# Patient Record
Sex: Male | Born: 1935 | Race: White | Hispanic: No | Marital: Married | Smoking: Former smoker
Health system: Southern US, Community
[De-identification: ages and names within clinical notes are randomized; demographics above are authoritative.]

## PROBLEM LIST (undated history)

## (undated) DIAGNOSIS — F32A Depression, unspecified: Secondary | ICD-10-CM

## (undated) DIAGNOSIS — M5136 Other intervertebral disc degeneration, lumbar region: Secondary | ICD-10-CM

## (undated) DIAGNOSIS — I4891 Unspecified atrial fibrillation: Secondary | ICD-10-CM

## (undated) DIAGNOSIS — K4090 Unilateral inguinal hernia, without obstruction or gangrene, not specified as recurrent: Secondary | ICD-10-CM

## (undated) DIAGNOSIS — D649 Anemia, unspecified: Secondary | ICD-10-CM

## (undated) DIAGNOSIS — G20 Parkinson's disease: Secondary | ICD-10-CM

## (undated) DIAGNOSIS — K439 Ventral hernia without obstruction or gangrene: Secondary | ICD-10-CM

## (undated) DIAGNOSIS — G43909 Migraine, unspecified, not intractable, without status migrainosus: Secondary | ICD-10-CM

## (undated) DIAGNOSIS — Q613 Polycystic kidney, unspecified: Secondary | ICD-10-CM

## (undated) DIAGNOSIS — C801 Malignant (primary) neoplasm, unspecified: Secondary | ICD-10-CM

## (undated) DIAGNOSIS — E785 Hyperlipidemia, unspecified: Secondary | ICD-10-CM

## (undated) DIAGNOSIS — I499 Cardiac arrhythmia, unspecified: Secondary | ICD-10-CM

## (undated) DIAGNOSIS — F329 Major depressive disorder, single episode, unspecified: Secondary | ICD-10-CM

## (undated) DIAGNOSIS — G20A1 Parkinson's disease without dyskinesia, without mention of fluctuations: Secondary | ICD-10-CM

## (undated) DIAGNOSIS — K432 Incisional hernia without obstruction or gangrene: Secondary | ICD-10-CM

## (undated) DIAGNOSIS — I359 Nonrheumatic aortic valve disorder, unspecified: Secondary | ICD-10-CM

## (undated) DIAGNOSIS — I10 Essential (primary) hypertension: Secondary | ICD-10-CM

## (undated) DIAGNOSIS — R251 Tremor, unspecified: Secondary | ICD-10-CM

## (undated) HISTORY — DX: Hyperlipidemia, unspecified: E78.5

## (undated) HISTORY — DX: Polycystic kidney, unspecified: Q61.3

## (undated) HISTORY — PX: CARDIAC VALVE REPLACEMENT: SHX585

## (undated) HISTORY — PX: CORONARY ARTERY BYPASS GRAFT: SHX141

## (undated) HISTORY — DX: Parkinson's disease without dyskinesia, without mention of fluctuations: G20.A1

## (undated) HISTORY — DX: Unilateral inguinal hernia, without obstruction or gangrene, not specified as recurrent: K40.90

## (undated) HISTORY — DX: Tremor, unspecified: R25.1

## (undated) HISTORY — DX: Malignant (primary) neoplasm, unspecified: C80.1

## (undated) HISTORY — DX: Anemia, unspecified: D64.9

## (undated) HISTORY — DX: Parkinson's disease: G20

## (undated) HISTORY — DX: Essential (primary) hypertension: I10

## (undated) HISTORY — DX: Nonrheumatic aortic valve disorder, unspecified: I35.9

## (undated) HISTORY — PX: AORTIC AND MITRAL VALVE REPLACEMENT: SHX5056

## (undated) HISTORY — PX: MOHS SURGERY: SUR867

## (undated) HISTORY — PX: APPENDECTOMY: SHX54

## (undated) HISTORY — DX: Unspecified atrial fibrillation: I48.91

## (undated) HISTORY — DX: Other intervertebral disc degeneration, lumbar region: M51.36

## (undated) HISTORY — DX: Migraine, unspecified, not intractable, without status migrainosus: G43.909

---

## 1983-10-18 HISTORY — PX: ASCENDING AORTIC ANEURYSM REPAIR W/ MECHANICAL AORTIC VALVE REPLACEMENT: SHX1192

## 2003-10-05 ENCOUNTER — Other Ambulatory Visit: Payer: Self-pay

## 2005-09-03 ENCOUNTER — Other Ambulatory Visit: Payer: Self-pay

## 2005-09-03 ENCOUNTER — Emergency Department: Payer: Self-pay | Admitting: Emergency Medicine

## 2005-09-22 ENCOUNTER — Ambulatory Visit: Payer: Self-pay | Admitting: *Deleted

## 2005-09-27 ENCOUNTER — Ambulatory Visit: Payer: Self-pay | Admitting: *Deleted

## 2005-09-28 ENCOUNTER — Ambulatory Visit (HOSPITAL_COMMUNITY): Admission: RE | Admit: 2005-09-28 | Discharge: 2005-09-28 | Payer: Self-pay | Admitting: *Deleted

## 2006-07-25 ENCOUNTER — Ambulatory Visit: Payer: Self-pay | Admitting: Unknown Physician Specialty

## 2006-10-30 ENCOUNTER — Emergency Department: Payer: Self-pay | Admitting: Unknown Physician Specialty

## 2008-04-13 DIAGNOSIS — N400 Enlarged prostate without lower urinary tract symptoms: Secondary | ICD-10-CM | POA: Insufficient documentation

## 2008-05-28 ENCOUNTER — Ambulatory Visit: Payer: Self-pay

## 2012-03-21 DIAGNOSIS — I359 Nonrheumatic aortic valve disorder, unspecified: Secondary | ICD-10-CM | POA: Insufficient documentation

## 2012-03-21 DIAGNOSIS — I10 Essential (primary) hypertension: Secondary | ICD-10-CM | POA: Insufficient documentation

## 2013-09-03 DIAGNOSIS — H5000 Unspecified esotropia: Secondary | ICD-10-CM | POA: Insufficient documentation

## 2014-04-10 DIAGNOSIS — Z952 Presence of prosthetic heart valve: Secondary | ICD-10-CM | POA: Insufficient documentation

## 2015-01-14 DIAGNOSIS — E782 Mixed hyperlipidemia: Secondary | ICD-10-CM | POA: Insufficient documentation

## 2015-03-04 ENCOUNTER — Encounter: Payer: Self-pay | Admitting: Physical Therapy

## 2015-03-04 ENCOUNTER — Ambulatory Visit: Payer: Medicare Other | Admitting: Speech Pathology

## 2015-03-04 ENCOUNTER — Ambulatory Visit: Payer: Medicare Other | Attending: Neurology | Admitting: Physical Therapy

## 2015-03-04 DIAGNOSIS — R278 Other lack of coordination: Secondary | ICD-10-CM

## 2015-03-04 DIAGNOSIS — G20 Parkinson's disease: Secondary | ICD-10-CM | POA: Insufficient documentation

## 2015-03-04 DIAGNOSIS — R279 Unspecified lack of coordination: Secondary | ICD-10-CM | POA: Insufficient documentation

## 2015-03-04 DIAGNOSIS — R490 Dysphonia: Secondary | ICD-10-CM

## 2015-03-04 NOTE — Therapy (Signed)
[place] MAIN [facility] SERVICES [address] [place], [place], [address] Phone: [phone]   Fax:  [phone]  Physical Therapy Evaluation  Patient Details  Name: Jonathon Welch MRN: [id number] Date of Birth: 1936-08-27 Referring Provider:  Vladimir Crofts, MD  Encounter Date: 03/04/2015      PT End of Session - 03/04/15 1427    Visit Number 1   Number of Visits 1   Date for PT Re-Evaluation 03/02/15   PT Start Time 0100   PT Stop Time 0200   PT Time Calculation (min) 60 min   Activity Tolerance Patient tolerated treatment well   Behavior During Therapy [facility] for tasks assessed/performed      No past medical history on file.  No past surgical history on file.  There were no vitals filed for this visit.  Visit Diagnosis:  Decreased coordination - Plan: PT plan of care cert/re-cert      Subjective Assessment - 03/04/15 1315    Subjective Patient has difficulty with buttons, wallet in and out pocket, trouble with cell phone,    Currently in Pain? Yes   Pain Location Hip   Pain Orientation Right;Left   Pain Descriptors / Indicators Aching   Pain Onset More than a month ago   Pain Frequency Intermittent            [facility] PT Assessment - 03/04/15 0001    Assessment   Medical Diagnosis parkinsons disease 2007   Onset Date 12/22/14   Next MD Visit no   Prior Therapy no   Precautions   Precautions Other (comment)  no   Restrictions   Weight Bearing Restrictions No   Balance Screen   Has the patient fallen in the past 6 months No   Has the patient had a decrease in activity level because of a fear of falling?  No   Is the patient reluctant to leave their home because of a fear of falling?  No   Home Environment   Living Enviornment Private residence   Living Arrangements Spouse/significant other   Available Help at Discharge Family   Type of [place] to enter   Entrance Stairs-Number of Steps 2    [place] Two level   Functional Tests   Functional tests Sit to Stand  berg balance 56/56   Sit to Stand   Comments 10.92 sec                           PT Education - 03/04/15 1427    Education provided Yes   Person(s) Educated Patient   Methods Explanation;Demonstration;Tactile cues   Comprehension Verbalized understanding;Returned demonstration;Verbal cues required             PT Long Term Goals - 03/04/15 1438    PT LONG TERM GOAL #1   Title Patient will understand the benefits of LSVT BIG program and be independent with HEP.    Status Achieved               Plan - 03/04/15 1428    Clinical Impression Statement Patient is 79 yr old male with dx of parkonsons and the following deficits; poor BUE coordination, decreased dextirity for fine motor control, decreased hand sensation. He has WNL Berg balance and outcome measures that indicate no risk for falls including 5 x sit to stnad and TUG, TUG c Tug cognitive. Patiient has a  wife that is sick and he choses to defer his therapy at this time until he feels that his wifes immediate needs are met. Patient wiould benefit from skilled LSVT BIG to improve peg test and coordination of BUE for ADL's Patietn was educated about the LSVT BIG program benefits and would like to participate in the future.    PT Frequency One time visit   PT Home Exercise Plan Given chop sticks to practice fine motor control, instruted in card shuffling, and card dealing.   Consulted and Agree with Plan of Care Patient         Problem List There are no active problems to display for this patient.   Alanson Puls 03/04/2015, 2:44 PM  [place] MAIN [facility] SERVICES [address] [place], [place], [address] Phone: [phone]   Fax:  [phone]

## 2015-03-05 ENCOUNTER — Encounter: Payer: Self-pay | Admitting: Speech Pathology

## 2015-03-05 DIAGNOSIS — G20 Parkinson's disease: Secondary | ICD-10-CM | POA: Diagnosis not present

## 2015-03-05 NOTE — Therapy (Signed)
[place] MAIN [facility] SERVICES [address] [place], [place], [address] Phone: [phone]   Fax:  [phone]  Speech Language Pathology Evaluation  Patient Details  Name: Jonathon Welch MRN: [id number] Date of Birth: [birth year] Referring Provider:  Vladimir Crofts, MD  Encounter Date: 03/04/2015      End of Session - 03/04/15 1415    Visit Number 1   Number of Visits 1   Date for SLP Re-Evaluation 03/04/15   SLP Start Time 1400   SLP Stop Time  2671   SLP Time Calculation (min) 49 min   Activity Tolerance Patient tolerated treatment well      History reviewed. No pertinent past medical history.  History reviewed. No pertinent past surgical history.  There were no vitals filed for this visit.  Visit Diagnosis: Dysphonia - Plan: SLP plan of care cert/re-cert      Subjective Assessment - 03/04/15 1407    Subjective Patient is not sure that he is having trouble being understood by others           SLP Evaluation [facility] - 03/04/15 1408    SLP Visit Information   SLP Received On 03/04/15   Onset Date --  Diagnosed with Parkinson's Disease in 2007   Medical Diagnosis Parkinson's Disease   Prior Functional Status   Cognitive/Linguistic Baseline Baseline deficits   Baseline deficit details Dysphonia secondary Parkinson's disease   Cognition   Overall Cognitive Status Within Functional Limits for tasks assessed   Auditory Comprehension   Overall Auditory Comprehension Appears within functional limits for tasks assessed   Reading Comprehension   Reading Status Within funtional limits   Verbal Expression   Overall Verbal Expression Appears within functional limits for tasks assessed   Oral Motor/Sensory Function   Overall Oral Motor/Sensory Function Appears within functional limits for tasks assessed   Motor Speech   Overall Motor Speech Impaired   Respiration Impaired   Level of Impairment Conversation   Phonation Low  vocal intensity;Hoarse   Resonance Within functional limits   Articulation Within functional limitis   Intelligibility Intelligible   Motor Planning [place] functional limits   Motor Speech Errors Not applicable   Phonation Impaired   Standardized Assessments   Standardized Assessments  --  Perceptual Voice Evaluation      Perceptual Voice Evaluation Maximum phonation time for sustained ah:  12 seconds  Mean intensity during sustained ah: 66 dB  Mean intensity during sustained during conversational speech: 64 dB  Average fundamental frequency during sustained ah: 159 Hz  Highest dynamic pitch when altering pitch from a low note to a high note:  308 Hz  Highest pitch during conversational speech: 581 Hz  Lowest dynamic pitch when altering from a high note to a low note  128 Hz  Lowest pitch during conversational speech:  104 Hz  Visi-Pitch: Multi-Dimensional Voice Program (MDVP)  MDVP extracts objective quantitative values  (Relative Average Perturbation, Shimmer, Voice Turbulence Index, and Noise to Harmonic Ratio) on sustained  phonation, which are displayed graphically and numerically in comparison to a built-in normative database.   The patient exhibited values within normal limits for Relative Average Perturbation, Shimmer, Voice Turbulence  Index, and Noise to Harmonic Ratio.           SLP Education - 03/05/15 1414    Education provided Yes   Education Details Explain LSVT-LOUD program   Person(s) Educated Patient   Methods Explanation;Demonstration   Comprehension Verbalized understanding;Returned demonstration  Plan - 04-04-15 1416    Clinical Impression Statement This 79 year old man, with Parkinson' disease, is presenting with moderate dysphonia characterized by hypophonia, monotone voice, and hoarse vocal quality.  Based on stimulability testing, the patient is judged to be a good candidate for the LSVT LOUD program.  It is  recommended that the patient receive the LSVT LOUD program which is comprised of 16 intensive sessions (4 times per week for 4 weeks, one hour sessions).  LSVT LOUD has been documented in the literature as efficacious for individuals with Parkinson's disease.  The patient has opted not to start LSVT LOUD at this time as he cannot commit to the intensive schedule at this time.  He states that he will discuss the LSVT programs when he next see his MD.   Speech Therapy Frequency 1x /week   Duration 1 week   Treatment/Interventions Other (comment)  LSVT LOUD assessment protocol, education RE: LSVT LOUD   Potential to Achieve Goals Good   Potential Considerations Ability to learn/carryover information;Cooperation/participation level;Previous level of function;Severity of impairments;Family/community support   Consulted and Agree with Plan of Care Patient          G-Codes - April 04, 2015 1421    Functional Assessment Tool Used LSVT LOUD assessment protocol   Functional Limitations Voice   Voice Current Status (G9171) At least 40 percent but less than 60 percent impaired, limited or restricted   Voice Goal Status (M3361) At least 40 percent but less than 60 percent impaired, limited or restricted   Voice Discharge Status (Q2449) At least 40 percent but less than 60 percent impaired, limited or restricted      Problem List There are no active problems to display for this patient.  [name], MS/CCC- SLP  Lou Miner 2015/04/04, 2:28 PM  [place] MAIN [facility] SERVICES [address] [place], [place], [address] Phone: [phone]   Fax:  [phone]

## 2015-03-09 ENCOUNTER — Ambulatory Visit: Payer: Medicare Other | Admitting: Physical Therapy

## 2015-03-09 ENCOUNTER — Ambulatory Visit: Payer: Medicare Other | Admitting: Speech Pathology

## 2015-03-10 ENCOUNTER — Ambulatory Visit: Payer: Medicare Other | Admitting: Physical Therapy

## 2015-03-10 ENCOUNTER — Ambulatory Visit: Payer: Medicare Other | Admitting: Speech Pathology

## 2015-03-11 ENCOUNTER — Ambulatory Visit: Payer: Self-pay | Admitting: Speech Pathology

## 2015-03-11 ENCOUNTER — Ambulatory Visit: Payer: Medicare Other | Admitting: Physical Therapy

## 2015-03-12 ENCOUNTER — Ambulatory Visit: Payer: Medicare Other | Admitting: Physical Therapy

## 2015-03-12 ENCOUNTER — Ambulatory Visit: Payer: Medicare Other | Admitting: Speech Pathology

## 2015-03-17 ENCOUNTER — Ambulatory Visit: Payer: Medicare Other | Admitting: Physical Therapy

## 2015-03-17 ENCOUNTER — Ambulatory Visit: Payer: Medicare Other | Admitting: Speech Pathology

## 2015-03-18 ENCOUNTER — Ambulatory Visit: Payer: Medicare Other | Admitting: Physical Therapy

## 2015-03-18 ENCOUNTER — Encounter: Payer: Self-pay | Admitting: Speech Pathology

## 2015-03-19 ENCOUNTER — Encounter: Payer: Self-pay | Admitting: Speech Pathology

## 2015-03-19 ENCOUNTER — Ambulatory Visit: Payer: Medicare Other | Admitting: Physical Therapy

## 2015-03-20 ENCOUNTER — Encounter: Payer: Self-pay | Admitting: Speech Pathology

## 2015-03-23 ENCOUNTER — Encounter: Payer: Self-pay | Admitting: Speech Pathology

## 2015-03-23 ENCOUNTER — Ambulatory Visit: Payer: Medicare Other | Admitting: Physical Therapy

## 2015-03-24 ENCOUNTER — Encounter: Payer: Self-pay | Admitting: Speech Pathology

## 2015-03-24 ENCOUNTER — Ambulatory Visit: Payer: Medicare Other | Admitting: Physical Therapy

## 2015-03-25 ENCOUNTER — Ambulatory Visit: Payer: Medicare Other | Admitting: Physical Therapy

## 2015-03-25 ENCOUNTER — Encounter: Payer: Self-pay | Admitting: Speech Pathology

## 2015-03-26 ENCOUNTER — Ambulatory Visit: Payer: Medicare Other | Admitting: Physical Therapy

## 2015-03-26 ENCOUNTER — Encounter: Payer: Self-pay | Admitting: Speech Pathology

## 2015-03-30 ENCOUNTER — Encounter: Payer: Self-pay | Admitting: Speech Pathology

## 2015-03-30 ENCOUNTER — Ambulatory Visit: Payer: Medicare Other | Admitting: Physical Therapy

## 2015-03-31 ENCOUNTER — Ambulatory Visit: Payer: Self-pay | Admitting: Physical Therapy

## 2015-03-31 ENCOUNTER — Encounter: Payer: Self-pay | Admitting: Speech Pathology

## 2015-04-01 ENCOUNTER — Encounter: Payer: Self-pay | Admitting: Speech Pathology

## 2015-04-02 ENCOUNTER — Ambulatory Visit: Payer: Self-pay | Admitting: Physical Therapy

## 2015-04-03 ENCOUNTER — Encounter: Payer: Self-pay | Admitting: Speech Pathology

## 2015-04-03 ENCOUNTER — Ambulatory Visit: Payer: Self-pay | Admitting: Physical Therapy

## 2015-04-06 ENCOUNTER — Ambulatory Visit: Payer: Self-pay | Admitting: Physical Therapy

## 2015-04-07 ENCOUNTER — Ambulatory Visit: Payer: Self-pay | Admitting: Physical Therapy

## 2016-01-14 DIAGNOSIS — I482 Chronic atrial fibrillation, unspecified: Secondary | ICD-10-CM | POA: Insufficient documentation

## 2016-02-29 ENCOUNTER — Other Ambulatory Visit: Payer: Self-pay | Admitting: Internal Medicine

## 2016-02-29 DIAGNOSIS — M5116 Intervertebral disc disorders with radiculopathy, lumbar region: Secondary | ICD-10-CM

## 2016-03-04 ENCOUNTER — Ambulatory Visit
Admission: RE | Admit: 2016-03-04 | Discharge: 2016-03-04 | Disposition: A | Discharge status: Home / Self Care | Payer: Medicare Other | Source: Ambulatory Visit | Attending: Internal Medicine | Admitting: Internal Medicine

## 2016-03-04 DIAGNOSIS — M4806 Spinal stenosis, lumbar region: Secondary | ICD-10-CM | POA: Diagnosis not present

## 2016-03-04 DIAGNOSIS — M5116 Intervertebral disc disorders with radiculopathy, lumbar region: Secondary | ICD-10-CM

## 2016-03-04 DIAGNOSIS — M5136 Other intervertebral disc degeneration, lumbar region: Secondary | ICD-10-CM | POA: Insufficient documentation

## 2016-07-25 DIAGNOSIS — M5136 Other intervertebral disc degeneration, lumbar region: Secondary | ICD-10-CM

## 2016-07-25 DIAGNOSIS — M51369 Other intervertebral disc degeneration, lumbar region without mention of lumbar back pain or lower extremity pain: Secondary | ICD-10-CM

## 2016-07-25 DIAGNOSIS — E538 Deficiency of other specified B group vitamins: Secondary | ICD-10-CM | POA: Insufficient documentation

## 2016-07-25 HISTORY — DX: Other intervertebral disc degeneration, lumbar region without mention of lumbar back pain or lower extremity pain: M51.369

## 2016-07-25 HISTORY — DX: Other intervertebral disc degeneration, lumbar region: M51.36

## 2017-01-30 ENCOUNTER — Other Ambulatory Visit: Payer: Self-pay

## 2017-02-01 ENCOUNTER — Ambulatory Visit (INDEPENDENT_AMBULATORY_CARE_PROVIDER_SITE_OTHER): Payer: Medicare Other | Admitting: Surgery

## 2017-02-01 ENCOUNTER — Other Ambulatory Visit: Payer: Self-pay

## 2017-02-01 ENCOUNTER — Encounter: Payer: Self-pay | Admitting: Surgery

## 2017-02-01 VITALS — BP 163/76 | HR 56 | Temp 97.5°F | Wt 172.0 lb

## 2017-02-01 DIAGNOSIS — K4020 Bilateral inguinal hernia, without obstruction or gangrene, not specified as recurrent: Secondary | ICD-10-CM | POA: Diagnosis not present

## 2017-02-01 DIAGNOSIS — K439 Ventral hernia without obstruction or gangrene: Secondary | ICD-10-CM | POA: Diagnosis not present

## 2017-02-01 NOTE — Progress Notes (Signed)
Surgical Consultation  02/01/2017  Jonathon Welch is an 81 y.o. male.   CC:IH  HPI: This patient with multiple medical problems with an inguinal hernia referred over by [facility] clinic. It is on the right side. He is anticoagulated for an aortic valve. He has no pain no nausea vomiting he's noticed his belly is getting bigger over the last year. He is known about the hernia for over a year. He also questions whether he's had other hernias. He had a prior umbilical hernia repair at [facility] many years ago. Past Medical History:  Diagnosis Date  . Anemia   . Aortic valve disease   . Atrial fibrillation ([place])   . Cancer ([place])    basal and squamous cell skin cancer  . DDD (degenerative disc disease), lumbar 07/25/2016  . Hyperlipidemia   . Hypertension   . Migraine headache   . Parkinson disease ([place])   . Polycystic kidney disease   . Right inguinal hernia   . Tremor     Past Surgical History:  Procedure Laterality Date  . APPENDECTOMY    . ASCENDING AORTIC ANEURYSM REPAIR W/ MECHANICAL AORTIC VALVE REPLACEMENT    . CARDIAC VALVE REPLACEMENT  1980 and 1985  . MOHS SURGERY      Family History  Problem Relation Age of Onset  . Heart disease Mother   . Hypertension Mother   . Stroke Father   . Kidney disease Sister     Social History:  reports that he quit smoking about 47 years ago. He has a 22.00 pack-year smoking history. He has never used smokeless tobacco. He reports that he does not drink alcohol or use drugs.  Allergies:  Allergies  Allergen Reactions  . Aspirin Nausea And Vomiting  . 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   Review of Systems:   Review of Systems  Constitutional: Negative.   HENT: Negative.   Eyes: Negative.   Respiratory: Negative.   Cardiovascular: Negative.   Gastrointestinal: Negative.   Genitourinary: Negative.    Musculoskeletal: Negative.   Skin: Negative.   Neurological: Negative.   Endo/Heme/Allergies: Negative.   Psychiatric/Behavioral: Negative.      Physical Exam:  BP (!) 163/76   Pulse (!) 56   Temp 97.5 F (36.4 C) (Oral)   Wt 172 lb (78 kg)   Physical Exam  Constitutional: He is oriented to person, place, and time and well-developed, well-nourished, and in no distress.  Weak frail appearing male. He has Parkinson's tremor and has difficulty on but now his pants. He walks and a shuffling gait.  HENT:  Head: Normocephalic and atraumatic.  Eyes: Pupils are equal, round, and reactive to light. Right eye exhibits no discharge. Left eye exhibits no discharge. No scleral icterus.  Neck: Normal range of motion.  Pulmonary/Chest: Effort normal. No respiratory distress.  Abdominal: Soft. He exhibits distension. There is no tenderness. There is no rebound and no guarding.  Multiple scars present. There is an epigastric hernia beneath the sternotomy wound that extends onto his abdomen There is a scar on the midline with a surgically absent umbilicus with a recurrent ventral hernia this is non-tender and completely reducible Exam in supine and standing he has bilateral inguinal hernias right greater than left with normal testicles  Genitourinary: Penis normal. No discharge found.  Genitourinary Comments: Right greater than  left inguinal hernias both reducible and nontender  Musculoskeletal: Normal range of motion. He exhibits edema. He exhibits no tenderness.  Lymphadenopathy:    He has no cervical adenopathy.  Neurological: He is alert and oriented to person, place, and time.  Parkinson's tremor  Skin: Skin is warm and dry. No erythema.  Psychiatric: Mood and affect normal.  Vitals reviewed.     No results found for this or any previous visit (from the past 48 hour(s)). No results found.  Assessment/Plan:  Patient comes to visit for a right inguinal hernia with each is  essentially asymptomatic. On exam and by history he has a epigastric hernia from prior sternotomy. He also has a surgically absent umbilicus with a recurrent ventral hernia and on exam he has both right and left inguinal hernias.  I discussed with them the procedure of repairing one or all of the 4 hernias identified. Currently he is completely asymptomatic and carries with him significant risk especially for bleeding and subsequent infection if mesh were to be used which would almost certainly be a necessity. Recommendations are to observe these had to utilize a truss for the bilateral inguinal hernias and follow-up on an as-needed basis  Florene Glen, MD, FACS

## 2017-02-01 NOTE — Patient Instructions (Signed)
You may purchase a Bilateral Hernia Truss from your local drug store or [employer]. This will help with keeping the hernia in place. Please call our office if you have any questions or concerns.

## 2017-05-02 ENCOUNTER — Encounter: Payer: Self-pay | Admitting: Emergency Medicine

## 2017-05-02 ENCOUNTER — Emergency Department: Payer: Medicare Other

## 2017-05-02 ENCOUNTER — Emergency Department
Admission: EM | Admit: 2017-05-02 | Discharge: 2017-05-02 | Disposition: A | Discharge status: Home / Self Care | Payer: Medicare Other | Attending: Emergency Medicine | Admitting: Emergency Medicine

## 2017-05-02 DIAGNOSIS — R42 Dizziness and giddiness: Secondary | ICD-10-CM | POA: Diagnosis not present

## 2017-05-02 DIAGNOSIS — Z91010 Allergy to peanuts: Secondary | ICD-10-CM | POA: Insufficient documentation

## 2017-05-02 DIAGNOSIS — I10 Essential (primary) hypertension: Secondary | ICD-10-CM | POA: Insufficient documentation

## 2017-05-02 DIAGNOSIS — R079 Chest pain, unspecified: Secondary | ICD-10-CM | POA: Diagnosis present

## 2017-05-02 DIAGNOSIS — G20 Parkinson's disease: Secondary | ICD-10-CM | POA: Insufficient documentation

## 2017-05-02 DIAGNOSIS — Z87891 Personal history of nicotine dependence: Secondary | ICD-10-CM | POA: Diagnosis not present

## 2017-05-02 LAB — URINALYSIS, COMPLETE (UACMP) WITH MICROSCOPIC
Bacteria, UA: NONE SEEN
Bilirubin Urine: NEGATIVE
Glucose, UA: NEGATIVE mg/dL
Hgb urine dipstick: NEGATIVE
KETONES UR: 5 mg/dL — AB
Leukocytes, UA: NEGATIVE
Nitrite: NEGATIVE
PROTEIN: NEGATIVE mg/dL
RBC / HPF: NONE SEEN RBC/hpf (ref 0–5)
SQUAMOUS EPITHELIAL / LPF: NONE SEEN
Specific Gravity, Urine: 1.013 (ref 1.005–1.030)
WBC UA: NONE SEEN WBC/hpf (ref 0–5)
pH: 6 (ref 5.0–8.0)

## 2017-05-02 LAB — CBC
HCT: 42.7 % (ref 40.0–52.0)
HEMOGLOBIN: 13.9 g/dL (ref 13.0–18.0)
MCH: 26.9 pg (ref 26.0–34.0)
MCHC: 32.6 g/dL (ref 32.0–36.0)
MCV: 82.5 fL (ref 80.0–100.0)
PLATELETS: 144 10*3/uL — AB (ref 150–440)
RBC: 5.18 MIL/uL (ref 4.40–5.90)
RDW: 15.6 % — ABNORMAL HIGH (ref 11.5–14.5)
WBC: 4.1 10*3/uL (ref 3.8–10.6)

## 2017-05-02 LAB — BASIC METABOLIC PANEL
Anion gap: 9 (ref 5–15)
BUN: 29 mg/dL — ABNORMAL HIGH (ref 6–20)
CHLORIDE: 103 mmol/L (ref 101–111)
CO2: 26 mmol/L (ref 22–32)
CREATININE: 1.57 mg/dL — AB (ref 0.61–1.24)
Calcium: 9 mg/dL (ref 8.9–10.3)
GFR calc non Af Amer: 40 mL/min — ABNORMAL LOW (ref >60)
GFR, EST AFRICAN AMERICAN: 47 mL/min — AB (ref >60)
GLUCOSE: 99 mg/dL (ref 65–99)
Potassium: 3.9 mmol/L (ref 3.5–5.1)
Sodium: 138 mmol/L (ref 135–145)

## 2017-05-02 LAB — PROTIME-INR
INR: 2.8
PROTHROMBIN TIME: 30.1 s — AB (ref 11.4–15.2)

## 2017-05-02 LAB — TROPONIN I: Troponin I: <0.03 ng/mL (ref <0.03)

## 2017-05-02 NOTE — ED Provider Notes (Signed)
[facility] Emergency Department Provider Note       Time seen: ----------------------------------------- 6:21 PM on 05/02/2017 -----------------------------------------     I have reviewed the triage vital signs and the nursing notes.   HISTORY   Chief Complaint Chest Pain and Dizziness    HPI Jonathon Welch is a 81 y.o. male who presents to the ED for chest pressure and dizziness. Patient had an event where he had taken a nap for around 3 hours, subsequently woke up and symptoms started when he stood up. Patient states he took 5 or 6 steps and began feeling like he did not have control over his body. Patient felt lightheaded and dizzy but does not describe room spinning sensation. His wife states he is more lethargic than normal but he denies any complaints currently. He did have an episode of chest pressure and this seemed to start after an episode of vomiting.   Past Medical History:  Diagnosis Date  . Anemia   . Aortic valve disease   . Atrial fibrillation ([place])   . Cancer ([place])    basal and squamous cell skin cancer  . DDD (degenerative disc disease), lumbar 07/25/2016  . Hyperlipidemia   . Hypertension   . Migraine headache   . Parkinson disease ([place])   . Polycystic kidney disease   . Right inguinal hernia   . Tremor     Patient Active Problem List   Diagnosis Date Noted  . B12 deficiency 07/25/2016  . DDD (degenerative disc disease), lumbar 07/25/2016  . Chronic atrial fibrillation ([place]) 01/14/2016  . Combined fat and carbohydrate induced hyperlipemia 01/14/2015  . H/O aortic valve replacement 04/10/2014  . 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 Aortic valve defect 03/21/2012  . Hypertension 03/21/2012  . Benign essential HTN 04/14/2008  . Benign prostatic hypertrophy without urinary obstruction 04/13/2008  . Parkinson's disease ([place]) 04/13/2008    Past Surgical History:  Procedure Laterality Date  . APPENDECTOMY    . ASCENDING  AORTIC ANEURYSM REPAIR W/ MECHANICAL AORTIC VALVE REPLACEMENT    . CARDIAC VALVE REPLACEMENT  1980 and 1985  . MOHS SURGERY      Allergies Aspirin and Peanut oil  Social History Social History  Substance Use Topics  . Smoking status: Former Smoker    Packs/day: 2.00    Years: 11.00    Quit date: 5  . Smokeless tobacco: Never Used  . Alcohol use No    Review of Systems Constitutional: Negative for fever. Eyes: Negative for vision changes ENT:  Negative for congestion, sore throat Cardiovascular: Positive for chest pressure Respiratory: Negative for shortness of breath. Gastrointestinal: Negative for abdominal pain, positive for vomiting Genitourinary: Negative for dysuria. Musculoskeletal: Negative for back pain. Skin: Negative for rash. Neurological: Negative for headaches, focal weakness or numbness.  All systems negative/normal/unremarkable except as stated in the HPI  ____________________________________________   PHYSICAL EXAM:  VITAL SIGNS: ED Triage Vitals [05/02/17 1637]  Enc Vitals Group     BP (!) 164/89     Pulse Rate (!) 55     Resp 20     Temp (!) 97.5 F (36.4 C)     Temp Source Oral     SpO2 98 %     Weight 172 lb (78 kg)     Height      Head Circumference      Peak Flow      Pain Score      Pain Loc      Pain  Edu?      Excl. in [place]?    Constitutional: Alert and oriented. Well appearing and in no distress. Eyes: Conjunctivae are normal. Normal extraocular movements. ENT   Head: Normocephalic and atraumatic.   Nose: No congestion/rhinnorhea.   Mouth/Throat: Mucous membranes are moist.   Neck: No stridor. Cardiovascular: Normal rate, regular rhythm. Aortic Valvular click is auscultated Respiratory: Normal respiratory effort without tachypnea nor retractions. Breath sounds are clear and equal bilaterally. No wheezes/rales/rhonchi. Gastrointestinal: Soft and nontender. Normal bowel sounds Musculoskeletal: Nontender with normal  range of motion in extremities. No lower extremity tenderness nor edema. Neurologic:  Normal speech and language. No gross focal neurologic deficits are appreciated. Normal strength Skin:  Skin is warm, dry and intact. No rash noted. Psychiatric: Mood and affect are normal. Speech and behavior are normal.  ____________________________________________  EKG: Interpreted by me. Sinus bradycardia with sinus arrhythmia, occasional PVCs, LVH with repolarization abnormality  ____________________________________________  ED COURSE:  Pertinent labs & imaging results that were available during my care of the patient were reviewed by me and considered in my medical decision making (see chart for details). Patient presents for dizziness and vomiting with chest tightness, we will assess with labs and imaging as indicated. Clinical Course as of May 03 1955  Tue May 02, 2017  1919 Patient was able to ambulate without any difficulty here.  [JW]  [place] and patient would prefer that he go home today  [JW]    Clinical Course User Index [JW] Earleen Newport, MD   Procedures ____________________________________________   LABS (pertinent positives/negatives)  Labs Reviewed  BASIC METABOLIC PANEL - Abnormal; Notable for the following:       Result Value   BUN 29 (*)    Creatinine, Ser 1.57 (*)    GFR calc non Af Amer 40 (*)    GFR calc Af Amer 47 (*)    All other components within normal limits  CBC - Abnormal; Notable for the following:    RDW 15.6 (*)    Platelets 144 (*)    All other components within normal limits  URINALYSIS, COMPLETE (UACMP) WITH MICROSCOPIC - Abnormal; Notable for the following:    Color, Urine YELLOW (*)    APPearance CLEAR (*)    Ketones, ur 5 (*)    All other components within normal limits  PROTIME-INR - Abnormal; Notable for the following:    Prothrombin Time 30.1 (*)    All other components within normal limits  TROPONIN I  CBG MONITORING, ED     RADIOLOGY CT head  IMPRESSION: No definite acute finding by CT. Low-density in the pons that could relate to chronic small vessel disease, but more recent pontine insult cannot be excluded by CT.  Dense cerebellar calcification in a pattern that can indicate metabolic/endocrine abnormalities such as hyperparathyroidism.  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 ____________________________________________  FINAL ASSESSMENT AND PLAN  Chest pain, dizziness, vomiting  Plan: Patient's labs and imaging were dictated above. Patient had presented for dizziness with an isolated episode of chest pain and vomiting. This test here been reassuring although I would prefer he be admitted for observation the family states they would rather he go home. I will advise close outpatient follow-up.   Earleen Newport, MD   Note: This note was generated in part or whole with voice recognition software. Voice recognition is usually quite accurate but there are transcription errors that can and very often do occur. I apologize for any typographical  errors that were not detected and corrected.     Earleen Newport, MD 05/02/17 [phone]

## 2017-05-02 NOTE — ED Triage Notes (Signed)
Pt with chest pressure and dizziness.

## 2017-05-02 NOTE — ED Notes (Signed)
Ambulated pt around the room. Pt appears stable and has good balance. No issues walking.

## 2017-05-02 NOTE — ED Notes (Signed)
Patient discharged to home per MD order. Patient in stable condition, and deemed medically cleared by ED provider for discharge. Discharge instructions reviewed with patient/family using "Teach Back"; verbalized understanding of medication education and administration, and information about follow-up care. Denies further concerns. ° °

## 2017-10-17 DIAGNOSIS — K439 Ventral hernia without obstruction or gangrene: Secondary | ICD-10-CM

## 2017-10-17 DIAGNOSIS — K4090 Unilateral inguinal hernia, without obstruction or gangrene, not specified as recurrent: Secondary | ICD-10-CM

## 2017-10-17 HISTORY — DX: Unilateral inguinal hernia, without obstruction or gangrene, not specified as recurrent: K40.90

## 2017-10-17 HISTORY — DX: Ventral hernia without obstruction or gangrene: K43.9

## 2018-03-21 ENCOUNTER — Ambulatory Visit: Payer: Self-pay | Admitting: Surgery

## 2018-03-21 ENCOUNTER — Ambulatory Visit (INDEPENDENT_AMBULATORY_CARE_PROVIDER_SITE_OTHER): Payer: Medicare Other | Admitting: Surgery

## 2018-03-21 ENCOUNTER — Encounter: Payer: Self-pay | Admitting: Surgery

## 2018-03-21 VITALS — BP 163/84 | HR 70 | Temp 97.5°F | Ht >= 80 in | Wt 160.8 lb

## 2018-03-21 DIAGNOSIS — K4020 Bilateral inguinal hernia, without obstruction or gangrene, not specified as recurrent: Secondary | ICD-10-CM

## 2018-03-21 NOTE — Patient Instructions (Addendum)
You have requested to have a Ventral Hernia Repair. This will be done on 04/06/18 by Dr.Diego Pabon  at [facility]. Please see your (BLUE) Pre-care sheet for more information.  You will need to arrange to be out of work for approximately 1-2 weeks and then you may return with a lifting restriction for 4 more weeks. If you have FMLA or Disability paperwork that needs to be filled out, please have your company fax your paperwork to [phone] or you may drop this by either office. This paperwork will be filled out within 3 days after your surgery has been completed.  Ventral Hernia A ventral hernia (also called an incisional hernia) is a hernia that occurs at the site of a previous surgical cut (incision) in the abdomen. The abdominal wall spans from your lower chest down to your pelvis. If the abdominal wall is weakened from a surgical incision, a hernia can occur. A hernia is a bulge of bowel or muscle tissue pushing out on the weakened part of the abdominal wall. Ventral hernias can get bigger from straining or lifting. Obese and older people are at higher risk for a ventral hernia. People who develop infections after surgery or require repeat incisions at the same site on the abdomen are also at increased risk. CAUSES  A ventral hernia occurs because of weakness in the abdominal wall at an incision site.  SYMPTOMS  Common symptoms include:  A visible bulge or lump on the abdominal wall.  Pain or tenderness around the lump.  Increased discomfort if you cough or make a sudden movement. If the hernia has blocked part of the intestine, a serious complication can occur (incarcerated or strangulated hernia). This can become a problem that requires emergency surgery because the blood flow to the blocked intestine may be cut off. Symptoms may include:  Feeling sick to your stomach (nauseous).  Throwing up (vomiting).  Stomach swelling (distention) or bloating.  Fever.  Rapid heartbeat. DIAGNOSIS    Your health care provider will take a medical history and perform a physical exam. Various tests may be ordered, such as:  Blood tests.  Urine tests.  Ultrasonography.  X-rays.  Computed tomography (CT). TREATMENT  Watchful waiting may be all that is needed for a smaller hernia that does not cause symptoms. Your health care provider may recommend the use of a supportive belt (truss) that helps to keep the abdominal wall intact. For larger hernias or those that cause pain, surgery to repair the hernia is usually recommended. If a hernia becomes strangulated, emergency surgery needs to be done right away. HOME CARE INSTRUCTIONS  Avoid putting pressure or strain on the abdominal area.  Avoid heavy lifting.  Use good body positioning for physical tasks. Ask your health care provider about proper body positioning.  Use a supportive belt as directed by your health care provider.  Maintain a healthy weight.  Eat foods that are high in fiber, such as whole grains, fruits, and vegetables. Fiber helps prevent difficult bowel movements (constipation).  Drink enough fluids to keep your urine clear or pale yellow.  Follow up with your health care provider as directed. SEEK MEDICAL CARE IF:   Your hernia seems to be getting larger or more painful. SEEK IMMEDIATE MEDICAL CARE IF:   You have abdominal pain that is sudden and sharp.  Your pain becomes severe.  You have repeated vomiting.  You are sweating a lot.  You notice a rapid heartbeat.  You develop a fever. MAKE SURE YOU:  Understand these instructions.  Will watch your condition.  Will get help right away if you are not doing well or get worse.   This information is not intended to replace advice given to you by your health care provider. Make sure you discuss any questions you have with your health care provider.   Document Released: 09/19/2012 Document Revised: 10/24/2014 Document Reviewed: 09/19/2012 Elsevier  Interactive Patient Education 2016 Elsevier Inc.    Laparoscopic Ventral Hernia Repair Laparoscopic ventral hernia repairis a surgery to fix a ventral hernia. Aventral hernia, also called an incisional hernia, is a bulge of body tissue or intestines that pushes through the front part of the abdomen. This can happen if the connective tissue covering the muscles over the abdomen has a weak spot or is torn because of a surgical cut (incision) from a previous surgery. Laparoscopic ventral hernia repair is often done soon after diagnosis to stop the hernia from getting bigger, becoming uncomfortable, or becoming an emergency. This surgery usually takes about 2 hours, but the time can vary greatly. LET [facility] CARE PROVIDER KNOW ABOUT:  Any allergies you have.  All medicines you are taking, including steroids, vitamins, herbs, eye drops, creams, and over-the-counter medicines.  Previous problems you or members of your family have had with the use of anesthetics.  Any blood disorders you have.  Previous surgeries you have had.  Medical conditions you have. RISKS AND COMPLICATIONS  Generally, laparoscopic ventral hernia repair is a safe procedure. However, as with any surgical procedure, problems can occur. Possible problems include:  Bleeding.  Trouble passing urine or having a bowel movement after the surgery.  Infection.  Pneumonia.  Blood clots.  Pain in the area of the hernia.  A bulge in the area of the hernia that may be caused by a collection of fluid.  Injury to intestines or other structures in the abdomen.  Return of the hernia after surgery. In some cases, your health care provider may need to stop the laparoscopic procedure and do regular, open surgery. This may be necessary for very difficult hernias, when organs are hard to see, or when bleeding problems occur during surgery. BEFORE THE PROCEDURE   You may need to have blood tests, urine tests, a chest X-ray,  or an electrocardiogram done before the day of the surgery.  Ask your health care provider about changing or stopping your regular medicines. This is especially important if you are taking diabetes medicines or blood thinners.  You may need to wash with a special type of germ-killing soap.  Do not eat or drink anything after midnight the night before the procedure or as directed by your health care provider.  Make plans to have someone drive you home after the procedure. PROCEDURE   Small monitors will be put on your body. They are used to check your heart, blood pressure, and oxygen level.  An IV access tube will be put into a vein in your hand or arm. Fluids and medicine will flow directly into your body through the IV tube.  You will be given medicine that makes you go to sleep (general anesthetic).  Your abdomen will be cleaned with a special soap to kill any germs on your skin.  Once you are asleep, several small incisions will be made in your abdomen.  The large space in your abdomen will be filled with air so that it expands. This gives your health care provider more room and a better view.  A thin,  lighted tube with a tiny camera on the end (laparoscope) is put through a small incision in your abdomen. The camera on the laparoscope sends a picture to a TV screen in the operating room. This gives your health care provider a good view inside your abdomen.  Hollow tubes are put through the other small incisions in your abdomen. The tools needed for the procedure are put through these tubes.  Your health care provider puts the tissue or intestines that formed the hernia back in place.  A screen-like patch (mesh) is used to close the hernia. This helps make the area stronger. Stitches, tacks, or staples are used to keep the mesh in place.  Medicine and a bandage (dressing) or skin glue will be put over the incisions. AFTER THE PROCEDURE   You will stay in a recovery area until  the anesthetic wears off. Your blood pressure and pulse will be checked often.  You may be able to go home the same day or may need to stay in the hospital for 1-2 days after surgery. Your health care provider will decide when you can go home.  You may feel some pain. You may be given medicine for pain.  You will be urged to do breathing exercises that involve taking deep breaths. This helps prevent a lung infection after a surgery.  You may have to wear compression stockings while you are in the hospital. These stockings help keep blood clots from forming in your legs.   This information is not intended to replace advice given to you by your health care provider. Make sure you discuss any questions you have with your health care provider.   Document Released: 09/19/2012 Document Revised: 10/08/2013 Document Reviewed: 09/19/2012 Elsevier Interactive Patient Education 2016 Jonathon Welch have chose to have your hernia repaired. This will be done by Dr.  on  at [facility].  Please see your (blue) Pre-care information that you have been given today.  You will need to arrange to be out of work for 2 weeks and then return with a lifting restrictions for 4 more weeks. Please send any FMLA paperwork prior to surgery and we will fill this out and fax it back to your employer within 3 business days.  You may have a bruise in your groin and also swelling and brusing in your testicle area. You may use ice 4-5 times daily for 15-20 minutes each time. Make sure that you place a barrier between you and the ice pack. To decrease the swelling, you may roll up a bath towel and place it vertically in between your thighs with your testicles resting on the towel. You will want to keep this area elevated as much as possible for several days following surgery.    Inguinal Hernia, Adult Muscles help keep everything in the body in its proper place. But if a weak spot in the muscles develops, something can poke  through. That is called a hernia. When this happens in the lower part of the belly (abdomen), it is called an inguinal hernia. (It takes its name from a part of the body in this region called the inguinal canal.) A weak spot in the wall of muscles lets some fat or part of the small intestine bulge through. An inguinal hernia can develop at any age. Men get them more often than women. CAUSES  In adults, an inguinal hernia develops over time.  It can be triggered by:  Suddenly straining the muscles  of the lower abdomen.  Lifting heavy objects.  Straining to have a bowel movement. Difficult bowel movements (constipation) can lead to this.  Constant coughing. This may be caused by smoking or lung disease.  Being overweight.  Being pregnant.  Working at a job that requires long periods of standing or heavy lifting.  Having had an inguinal hernia before. One type can be an emergency situation. It is called a strangulated inguinal hernia. It develops if part of the small intestine slips through the weak spot and cannot get back into the abdomen. The blood supply can be cut off. If that happens, part of the intestine may die. This situation requires emergency surgery. SYMPTOMS  Often, a small inguinal hernia has no symptoms. It is found when a healthcare provider does a physical exam. Larger hernias usually have symptoms.   In adults, symptoms may include:  A lump in the groin. This is easier to see when the person is standing. It might disappear when lying down.  In men, a lump in the scrotum.  Pain or burning in the groin. This occurs especially when lifting, straining or coughing.  A dull ache or feeling of pressure in the groin.  Signs of a strangulated hernia can include:  A bulge in the groin that becomes very painful and tender to the touch.  A bulge that turns red or purple.  Fever, nausea and vomiting.  Inability to have a bowel movement or to pass gas. DIAGNOSIS  To  decide if you have an inguinal hernia, a healthcare provider will probably do a physical examination.  This will include asking questions about any symptoms you have noticed.  The healthcare provider might feel the groin area and ask you to cough. If an inguinal hernia is felt, the healthcare provider may try to slide it back into the abdomen.  Usually no other tests are needed. TREATMENT  Treatments can vary. The size of the hernia makes a difference. Options include:  Watchful waiting. This is often suggested if the hernia is small and you have had no symptoms.  No medical procedure will be done unless symptoms develop.  You will need to watch closely for symptoms. If any occur, contact your healthcare provider right away.  Surgery. This is used if the hernia is larger or you have symptoms.  Open surgery. This is usually an outpatient procedure (you will not stay overnight in a hospital). An cut (incision) is made through the skin in the groin. The hernia is put back inside the abdomen. The weak area in the muscles is then repaired by herniorrhaphy or hernioplasty. Herniorrhaphy: in this type of surgery, the weak muscles are sewn back together. Hernioplasty: a patch or mesh is used to close the weak area in the abdominal wall.  Laparoscopy. In this procedure, a surgeon makes small incisions. A thin tube with a tiny video camera (called a laparoscope) is put into the abdomen. The surgeon repairs the hernia with mesh by looking with the video camera and using two long instruments. HOME CARE INSTRUCTIONS   After surgery to repair an inguinal hernia:  You will need to take pain medicine prescribed by your healthcare provider. Follow all directions carefully.  You will need to take care of the wound from the incision.  Your activity will be restricted for awhile. This will probably include no heavy lifting for several weeks. You also should not do anything too active for a few weeks. When  you can return to work  will depend on the type of job that you have.  During "watchful waiting" periods, you should:  Maintain a healthy weight.  Eat a diet high in fiber (fruits, vegetables and whole grains).  Drink plenty of fluids to avoid constipation. This means drinking enough water and other liquids to keep your urine clear or pale yellow.  Do not lift heavy objects.  Do not stand for long periods of time.  Quit smoking. This should keep you from developing a frequent cough. SEEK MEDICAL CARE IF:   A bulge develops in your groin area.  You feel pain, a burning sensation or pressure in the groin. This might be worse if you are lifting or straining.  You develop a fever of more than 100.5 F (38.1 C). SEEK IMMEDIATE MEDICAL CARE IF:   Pain in the groin increases suddenly.  A bulge in the groin gets bigger suddenly and does not go down.  For men, there is sudden pain in the scrotum. Or, the size of the scrotum increases.  A bulge in the groin area becomes red or purple and is painful to touch.  You have nausea or vomiting that does not go away.  You feel your heart beating much faster than normal.  You cannot have a bowel movement or pass gas.  You develop a fever of more than 102.0 F (38.9 C).   This information is not intended to replace advice given to you by your health care provider. Make sure you discuss any questions you have with your health care provider.   Document Released: 02/19/2009 Document Revised: 12/26/2011 Document Reviewed: 04/06/2015 Elsevier Interactive Patient Education [employer].

## 2018-03-21 NOTE — Progress Notes (Signed)
Patient ID: Jonathon Welch, male   DOB: 1936-10-17, 82 y.o.   MRN: [id number]  HPI Jonathon Welch is a 82 y.o. male seen in consultation at the request of Dr. Windell Moment  ( d/w him in detail)for bilateral inguinal hernias as well as a ventral hernia.  He has a significant history of aortic stenosis requiring aortic valve replacement first with the tissue valve in the 80s followed by a prostatic valve about 6 years after that.  Apparently this operation was performed either at [facility] or [facility] by Dr. Donnal Moat.  Able to perform more than 4 METS of activity without any shortness of breath or chest pain.  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  He did not experience any dyspnea or chest pain at this time. He is chronically anticoagulated with Coumadin. He had a previous ventral hernia repair several years ago. As stated above he is a very functional 82 year old male.  He reports some intermittent right inguinal pain/discomfort that is dull and mild in nature.  Gets worse with Valsalva.  Over the last few years he is noticed more discomfort in the right groin.  HPI  Past Medical History:  Diagnosis Date  . Anemia   . Aortic valve disease   . Atrial fibrillation ([place])   . Cancer ([place])    basal and squamous cell skin cancer  . DDD (degenerative disc disease), lumbar 07/25/2016  . Hyperlipidemia   . Hypertension   . Migraine headache   . Parkinson disease ([place])   . Polycystic kidney disease   . Right inguinal hernia   . Tremor     Past Surgical History:  Procedure Laterality Date  . APPENDECTOMY    . ASCENDING AORTIC ANEURYSM REPAIR W/ MECHANICAL AORTIC VALVE REPLACEMENT    . CARDIAC VALVE REPLACEMENT  1980 and 1985  . MOHS SURGERY      Family History  Problem Relation Age of Onset  . Heart disease Mother   . Hypertension Mother   . Stroke Father   . Kidney disease Sister     Social History Social  History   Tobacco Use  . Smoking status: Former Smoker    Packs/day: 2.00    Years: 11.00    Pack years: 22.00    Last attempt to quit: 1971    Years since quitting: 48.4  . Smokeless tobacco: Never Used  Substance Use Topics  . Alcohol use: No  . Drug use: No    Allergies  Allergen Reactions  . Aspirin Nausea And Vomiting  . Peanut Oil Rash     Pt states he is allergic to peanuts - open sores in mouth    Current Outpatient Medications  Medication Sig Dispense Refill  . acetaminophen (TYLENOL) 500 MG tablet Take 1 tablet by mouth 1 day or 1 dose.    . carbidopa-levodopa (SINEMET IR) 25-100 MG tablet Take 1 tablet by mouth 5 (five) times daily.    . Cholecalciferol (VITAMIN D3) 1000 units CAPS Take 1 capsule by mouth 1 day or 1 dose.    . Coenzyme Q10 ([employer] COENZYME Q10) 400 MG CAPS Take 1 capsule by mouth 1 day or 1 dose.    . felodipine (PLENDIL) 10 MG 24 hr tablet Take 1 tablet by mouth 1 day or 1 dose.    . furosemide (LASIX) 20 MG tablet Take 20 mg by mouth daily.  1  .  gabapentin (NEURONTIN) 100 MG capsule Take 1 capsule by mouth 3 (three) times daily.    . saw palmetto (RA SAW PALMETTO) 80 MG capsule Take 1 tablet by mouth 3 (three) times daily.    . selegiline (ELDEPRYL) 5 MG capsule Take 1 capsule by mouth 2 (two) times daily.    Marland Kitchen warfarin (COUMADIN) 5 MG tablet Take 1 tablet by mouth. 5mg  daily except tues, wed 2.5mg     . potassium chloride (K-DUR) 10 MEQ tablet Take 1 tablet by mouth 1 day or 1 dose.     No current facility-administered medications for this visit.      Review of Systems Full ROS  was asked and was negative except for the information on the HPI  Physical Exam Blood pressure (!) 163/84, pulse 70, temperature (!) 97.5 F (36.4 C), temperature source Oral, height 7\' 5"  (2.261 m), weight 72.9 kg (160 lb 12.8 oz). CONSTITUTIONAL: NAD, well developed and good *82 yo, walks w/o cane or walker EYES: Pupils are equal, round, and reactive to light,  Sclera are non-icteric. EARS, NOSE, MOUTH AND THROAT: The oropharynx is clear. The oral mucosa is pink and moist. Hearing is intact to voice. LYMPH NODES:  Lymph nodes in the neck are normal. RESPIRATORY:  Lungs are clear. There is normal respiratory effort, with equal breath sounds bilaterally, and without pathologic use of accessory muscles. CARDIOVASCULAR: Heart is regular without murmurs, gallops, or rubs. GI: The abdomen is soft, non tender reducible ventral hernia, non tender.  There is chronically incarcerated right inguinal hernia that is mildly tender to palpation.  There is also a reducible left inguinal hernia. He has an additional small epigastric hernia within the subxiphoid area. GU: Rectal deferred.   MUSCULOSKELETAL: Normal muscle strength and tone. No cyanosis or edema.   SKIN: Turgor is good and there are no pathologic skin lesions or ulcers. NEUROLOGIC: Motor and sensation is grossly normal. Cranial nerves are grossly intact. PSYCH:  Oriented to person, place and time. Affect is normal.  Data Reviewed  I have personally reviewed the patient's imaging, laboratory findings and medical records.    Assessment/Plan  82 year old with symptomatic right inguinal hernia and asymptomatic left inguinal hernia as well as recurrent ventral hernia.  He is 82 years old but very functional.  Given his functional status and the symptomatology of the hernia I do recommend repair.  We had a lengthy discussion about different approaches.  Open versus minimally evasive.  I do think that he is a very good candidate for robotic bilateral inguinal hernia repair as well as have open ventral hernia repair with mesh that I will perform at the same time and likely through 1 of the small incision of the robotic hernias. Preoperatively he will definitely need to be bridge from Coumadin to Lovenox given his valve.  Getting touch with Dr. Sabra Heck regarding the Lovenox bridging.  I have discussed with the  patient detail about the proposed operation.  Risk benefit and possible complications including but not limited to: Bleeding, infection, recurrence, injury to adjacent organs including the small bowel.  Chronic pain and wound complications.  He understands and wishes to proceed. Plan for robotic bilateral inguinal hernia repair as well as a ventral hernia repair with mesh.  Intensive counseling provided. This report will be sent to the referring provider   Caroleen Hamman, MD FACS General Surgeon 03/21/2018, 2:53 PM

## 2018-03-21 NOTE — H&P (View-Only) (Signed)
Patient ID: Jonathon Welch, male   DOB: Aug 31, 1936, 82 y.o.   MRN: [id number]  HPI Jonathon Welch is a 82 y.o. male seen in consultation at the request of Dr. Windell Moment  ( d/w him in detail)for bilateral inguinal hernias as well as a ventral hernia.  He has a significant history of aortic stenosis requiring aortic valve replacement first with the tissue valve in the 80s followed by a prostatic valve about 6 years after that.  Apparently this operation was performed either at [facility] or [facility] by Dr. Donnal Moat.  Able to perform more than 4 METS of activity without any shortness of breath or chest pain.  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  He did not experience any dyspnea or chest pain at this time. He is chronically anticoagulated with Coumadin. He had a previous ventral hernia repair several years ago. As stated above he is a very functional 82 year old male.  He reports some intermittent right inguinal pain/discomfort that is dull and mild in nature.  Gets worse with Valsalva.  Over the last few years he is noticed more discomfort in the right groin.  HPI  Past Medical History:  Diagnosis Date  . Anemia   . Aortic valve disease   . Atrial fibrillation ([place])   . Cancer ([place])    basal and squamous cell skin cancer  . DDD (degenerative disc disease), lumbar 07/25/2016  . Hyperlipidemia   . Hypertension   . Migraine headache   . Parkinson disease ([place])   . Polycystic kidney disease   . Right inguinal hernia   . Tremor     Past Surgical History:  Procedure Laterality Date  . APPENDECTOMY    . ASCENDING AORTIC ANEURYSM REPAIR W/ MECHANICAL AORTIC VALVE REPLACEMENT    . CARDIAC VALVE REPLACEMENT  1980 and 1985  . MOHS SURGERY      Family History  Problem Relation Age of Onset  . Heart disease Mother   . Hypertension Mother   . Stroke Father   . Kidney disease Sister     Social History Social  History   Tobacco Use  . Smoking status: Former Smoker    Packs/day: 2.00    Years: 11.00    Pack years: 22.00    Last attempt to quit: 1971    Years since quitting: 48.4  . Smokeless tobacco: Never Used  Substance Use Topics  . Alcohol use: No  . Drug use: No    Allergies  Allergen Reactions  . Aspirin Nausea And Vomiting  . Peanut Oil Rash     Pt states he is allergic to peanuts - open sores in mouth    Current Outpatient Medications  Medication Sig Dispense Refill  . acetaminophen (TYLENOL) 500 MG tablet Take 1 tablet by mouth 1 day or 1 dose.    . carbidopa-levodopa (SINEMET IR) 25-100 MG tablet Take 1 tablet by mouth 5 (five) times daily.    . Cholecalciferol (VITAMIN D3) 1000 units CAPS Take 1 capsule by mouth 1 day or 1 dose.    . Coenzyme Q10 ([employer] COENZYME Q10) 400 MG CAPS Take 1 capsule by mouth 1 day or 1 dose.    . felodipine (PLENDIL) 10 MG 24 hr tablet Take 1 tablet by mouth 1 day or 1 dose.    . furosemide (LASIX) 20 MG tablet Take 20 mg by mouth daily.  1  .  gabapentin (NEURONTIN) 100 MG capsule Take 1 capsule by mouth 3 (three) times daily.    . saw palmetto (RA SAW PALMETTO) 80 MG capsule Take 1 tablet by mouth 3 (three) times daily.    . selegiline (ELDEPRYL) 5 MG capsule Take 1 capsule by mouth 2 (two) times daily.    Marland Kitchen warfarin (COUMADIN) 5 MG tablet Take 1 tablet by mouth. 5mg  daily except tues, wed 2.5mg     . potassium chloride (K-DUR) 10 MEQ tablet Take 1 tablet by mouth 1 day or 1 dose.     No current facility-administered medications for this visit.      Review of Systems Full ROS  was asked and was negative except for the information on the HPI  Physical Exam Blood pressure (!) 163/84, pulse 70, temperature (!) 97.5 F (36.4 C), temperature source Oral, height 7\' 5"  (2.261 m), weight 72.9 kg (160 lb 12.8 oz). CONSTITUTIONAL: NAD, well developed and good *82 yo, walks w/o cane or walker EYES: Pupils are equal, round, and reactive to light,  Sclera are non-icteric. EARS, NOSE, MOUTH AND THROAT: The oropharynx is clear. The oral mucosa is pink and moist. Hearing is intact to voice. LYMPH NODES:  Lymph nodes in the neck are normal. RESPIRATORY:  Lungs are clear. There is normal respiratory effort, with equal breath sounds bilaterally, and without pathologic use of accessory muscles. CARDIOVASCULAR: Heart is regular without murmurs, gallops, or rubs. GI: The abdomen is soft, non tender reducible ventral hernia, non tender.  There is chronically incarcerated right inguinal hernia that is mildly tender to palpation.  There is also a reducible left inguinal hernia. He has an additional small epigastric hernia within the subxiphoid area. GU: Rectal deferred.   MUSCULOSKELETAL: Normal muscle strength and tone. No cyanosis or edema.   SKIN: Turgor is good and there are no pathologic skin lesions or ulcers. NEUROLOGIC: Motor and sensation is grossly normal. Cranial nerves are grossly intact. PSYCH:  Oriented to person, place and time. Affect is normal.  Data Reviewed  I have personally reviewed the patient's imaging, laboratory findings and medical records.    Assessment/Plan  82 year old with symptomatic right inguinal hernia and asymptomatic left inguinal hernia as well as recurrent ventral hernia.  He is 82 years old but very functional.  Given his functional status and the symptomatology of the hernia I do recommend repair.  We had a lengthy discussion about different approaches.  Open versus minimally evasive.  I do think that he is a very good candidate for robotic bilateral inguinal hernia repair as well as have open ventral hernia repair with mesh that I will perform at the same time and likely through 1 of the small incision of the robotic hernias. Preoperatively he will definitely need to be bridge from Coumadin to Lovenox given his valve.  Getting touch with Dr. Sabra Heck regarding the Lovenox bridging.  I have discussed with the  patient detail about the proposed operation.  Risk benefit and possible complications including but not limited to: Bleeding, infection, recurrence, injury to adjacent organs including the small bowel.  Chronic pain and wound complications.  He understands and wishes to proceed. Plan for robotic bilateral inguinal hernia repair as well as a ventral hernia repair with mesh.  Intensive counseling provided. This report will be sent to the referring provider   Caroleen Hamman, MD FACS General Surgeon 03/21/2018, 2:53 PM

## 2018-03-21 NOTE — Progress Notes (Signed)
Medical / Anticoagulant Clearance faxed to Dr.Mark Sabra Heck at this time.

## 2018-03-22 ENCOUNTER — Ambulatory Visit: Payer: Self-pay | Admitting: Surgery

## 2018-03-23 ENCOUNTER — Telehealth: Payer: Self-pay | Admitting: Surgery

## 2018-03-23 NOTE — Telephone Encounter (Signed)
Pt advised of pre op date/time and sx date. Sx: 04/06/18 with Dr Kris Mouton assisted ventral and bilateral inguinal hernia repair.  Pre op: 03/29/18 @ 9:45am-Office interview.   Patient made aware to call [phone], between 1-3:00pm the day before surgery, to find out what time to arrive.

## 2018-03-26 ENCOUNTER — Telehealth: Payer: Self-pay | Admitting: General Practice

## 2018-03-26 NOTE — Telephone Encounter (Signed)
We have received  patients medical clearance from Dr.Mark Sabra Heck, patient has been granted for surgery. We have also received patient has permission to stop blood thinner 5 days prior to surgery .

## 2018-03-29 ENCOUNTER — Encounter
Admission: RE | Admit: 2018-03-29 | Discharge: 2018-03-29 | Disposition: A | Discharge status: Home / Self Care | Payer: Medicare Other | Source: Ambulatory Visit | Attending: Surgery | Admitting: Surgery

## 2018-03-29 ENCOUNTER — Other Ambulatory Visit: Payer: Self-pay

## 2018-03-29 DIAGNOSIS — Z01812 Encounter for preprocedural laboratory examination: Secondary | ICD-10-CM | POA: Insufficient documentation

## 2018-03-29 HISTORY — DX: Ventral hernia without obstruction or gangrene: K43.9

## 2018-03-29 HISTORY — DX: Cardiac arrhythmia, unspecified: I49.9

## 2018-03-29 HISTORY — DX: Unilateral inguinal hernia, without obstruction or gangrene, not specified as recurrent: K40.90

## 2018-03-29 HISTORY — DX: Incisional hernia without obstruction or gangrene: K43.2

## 2018-03-29 HISTORY — DX: Depression, unspecified: F32.A

## 2018-03-29 HISTORY — DX: Major depressive disorder, single episode, unspecified: F32.9

## 2018-03-29 LAB — CBC
HCT: 46.5 % (ref 40.0–52.0)
HEMOGLOBIN: 15.3 g/dL (ref 13.0–18.0)
MCH: 27.9 pg (ref 26.0–34.0)
MCHC: 32.8 g/dL (ref 32.0–36.0)
MCV: 85 fL (ref 80.0–100.0)
PLATELETS: 126 10*3/uL — AB (ref 150–440)
RBC: 5.47 MIL/uL (ref 4.40–5.90)
RDW: 17.4 % — ABNORMAL HIGH (ref 11.5–14.5)
WBC: 4.9 10*3/uL (ref 3.8–10.6)

## 2018-03-29 LAB — BASIC METABOLIC PANEL
Anion gap: 6 (ref 5–15)
BUN: 28 mg/dL — ABNORMAL HIGH (ref 6–20)
CALCIUM: 9.3 mg/dL (ref 8.9–10.3)
CHLORIDE: 105 mmol/L (ref 101–111)
CO2: 27 mmol/L (ref 22–32)
CREATININE: 1.46 mg/dL — AB (ref 0.61–1.24)
GFR calc non Af Amer: 44 mL/min — ABNORMAL LOW (ref >60)
GFR, EST AFRICAN AMERICAN: 51 mL/min — AB (ref >60)
Glucose, Bld: 109 mg/dL — ABNORMAL HIGH (ref 65–99)
Potassium: 4.3 mmol/L (ref 3.5–5.1)
SODIUM: 138 mmol/L (ref 135–145)

## 2018-03-29 NOTE — Patient Instructions (Signed)
Your procedure is scheduled on: Friday, April 06, 2018  Report to [place].     DO NOT STOP ON THE FIRST FLOOR TO REGISTER    To find out your arrival time please call [phone] between 1PM - 3PM on Thursday, April 05, 2018  Remember: Instructions that are not followed completely may result in serious medical risk,  up to and including death, or upon the discretion of your surgeon and anesthesiologist your  surgery may need to be rescheduled.     _X__ 1. Do not eat food after midnight the night before your procedure.                 No gum chewing or hard candies. ABSOLUTELY NOTHING SOLID                    IN YOUR MOUTH AFTER MIDNIGHT                 You may drink clear liquids up to 2 hours before you are scheduled to arrive for your surgery-                   DO not drink clear liquids within 2 hours of the start of your surgery.                  Clear Liquids include:  water, apple juice without pulp, clear carbohydrate                 drink such as Clearfast of Gatorade, Black Coffee or Tea (Do not add                 anything to coffee or tea).  __X__2.  On the morning of surgery brush your teeth with toothpaste and water,                       you may rinse your mouth with mouthwash if you wish.                           Do not swallow any toothpaste of mouthwash.     _X__ 3.  No Alcohol for 24 hours before or after surgery.   _X__ 4.  Do Not Smoke or use e-cigarettes For 24 Hours Prior to Your Surgery.                 Do not use any chewable tobacco products for at least 6 hours prior to                 surgery.  ____  5.  Bring all medications with you on the day of surgery if instructed.   ____  6.  Notify your doctor if there is any change in your medical condition      (cold, fever, infections).     Do not wear jewelry, make-up, hairpins, clips or nail polish. Do not wear lotions, powders, or perfumes. You may  wear deodorant. Do not shave 48 hours prior to surgery. Men may shave face and neck. Do not bring valuables to the hospital.    [facility] is not responsible for any belongings or valuables.  Contacts, dentures or bridgework may not be worn into surgery. Leave your suitcase in the car. After surgery it may be brought to your room. For patients admitted to the hospital, discharge time is  determined by your treatment team.   Patients discharged the day of surgery will not be allowed to drive home.   Please read over the following fact sheets that you were given:   PREPARING FOR SURGERY  ____ Take these medicines the morning of surgery with A SIP OF WATER:    1.SINEMET  2. SELEGILINE  3.   4.  5.  6.  ____ Fleet Enema (as directed)   _X___ Use CHG Soap as directed  ____ Use inhalers on the day of surgery  __X__ Stop Coumadin AS INSTRUCTED BY DR. Sabra Heck            STOP COUMADIN ON June 15TH            BEGIN LOVENOX ON June 16TH            (REFER TO YOUR SCHEDULE) **NO LOVENOX ON DAY OF SURGERY**   __X__ Stop Anti-inflammatories TODAY                 YOU MAY TAKE TYLENOL ANY TIME UP UNTIL SURGERY    __X__ Stop supplements until after surgery.                     THIS INCLUDES COq10               YOU MAY CONTINUE TAKING VITAMIN D3 BUT DO NOT TAKE                     ON MORNING OF SURGERY     CONTINUE TAKING BUMEX AND POTASSIUM AS USUAL BUT DO NOT               TAKE ON MORNING OF SURGERY  ____ Bring C-Pap to the hospital.   WEAR COMFORTABLE CLOTHES TO [place]  PLEASE BRING YOUR LIVING WILL AND MEDICAL DIRECTIVES TO [place]       SO WE MAY COPY IT FOR YOUR CHART  HAVE STOOL SOFTENERS READY TO TAKE AFTER SURGERY.       YOU MIGHT WANT TO TAKE PRIOR TO SURGERY.

## 2018-03-29 NOTE — Pre-Procedure Instructions (Signed)
DISCUSSED HISTORY WITH DR P CARROLL / [place]. OK TO PROCEED BY DR P CARROLL. NEURO NOTE FROM 12/20/17

## 2018-04-06 ENCOUNTER — Ambulatory Visit
Admission: RE | Admit: 2018-04-06 | Discharge: 2018-04-06 | Disposition: A | Discharge status: Home / Self Care | Payer: Medicare Other | Source: Ambulatory Visit | Attending: Surgery | Admitting: Surgery

## 2018-04-06 ENCOUNTER — Encounter: Payer: Self-pay | Admitting: *Deleted

## 2018-04-06 ENCOUNTER — Ambulatory Visit: Payer: Medicare Other | Admitting: Anesthesiology

## 2018-04-06 ENCOUNTER — Other Ambulatory Visit: Payer: Self-pay

## 2018-04-06 ENCOUNTER — Encounter
Admission: RE | Disposition: A | Discharge status: Home / Self Care | Payer: Self-pay | Source: Ambulatory Visit | Attending: Surgery

## 2018-04-06 DIAGNOSIS — I10 Essential (primary) hypertension: Secondary | ICD-10-CM | POA: Insufficient documentation

## 2018-04-06 DIAGNOSIS — Z87891 Personal history of nicotine dependence: Secondary | ICD-10-CM | POA: Diagnosis not present

## 2018-04-06 DIAGNOSIS — Z85828 Personal history of other malignant neoplasm of skin: Secondary | ICD-10-CM | POA: Diagnosis not present

## 2018-04-06 DIAGNOSIS — Z91010 Allergy to peanuts: Secondary | ICD-10-CM | POA: Diagnosis not present

## 2018-04-06 DIAGNOSIS — G20 Parkinson's disease: Secondary | ICD-10-CM | POA: Diagnosis not present

## 2018-04-06 DIAGNOSIS — K439 Ventral hernia without obstruction or gangrene: Secondary | ICD-10-CM

## 2018-04-06 DIAGNOSIS — D176 Benign lipomatous neoplasm of spermatic cord: Secondary | ICD-10-CM | POA: Diagnosis not present

## 2018-04-06 DIAGNOSIS — K4020 Bilateral inguinal hernia, without obstruction or gangrene, not specified as recurrent: Secondary | ICD-10-CM | POA: Diagnosis not present

## 2018-04-06 DIAGNOSIS — Z79899 Other long term (current) drug therapy: Secondary | ICD-10-CM | POA: Insufficient documentation

## 2018-04-06 DIAGNOSIS — Q613 Polycystic kidney, unspecified: Secondary | ICD-10-CM | POA: Insufficient documentation

## 2018-04-06 DIAGNOSIS — Z7901 Long term (current) use of anticoagulants: Secondary | ICD-10-CM | POA: Diagnosis not present

## 2018-04-06 DIAGNOSIS — Z952 Presence of prosthetic heart valve: Secondary | ICD-10-CM | POA: Diagnosis not present

## 2018-04-06 DIAGNOSIS — Z886 Allergy status to analgesic agent status: Secondary | ICD-10-CM | POA: Diagnosis not present

## 2018-04-06 DIAGNOSIS — K4090 Unilateral inguinal hernia, without obstruction or gangrene, not specified as recurrent: Secondary | ICD-10-CM

## 2018-04-06 DIAGNOSIS — K432 Incisional hernia without obstruction or gangrene: Secondary | ICD-10-CM | POA: Diagnosis not present

## 2018-04-06 DIAGNOSIS — E785 Hyperlipidemia, unspecified: Secondary | ICD-10-CM | POA: Diagnosis not present

## 2018-04-06 DIAGNOSIS — I4891 Unspecified atrial fibrillation: Secondary | ICD-10-CM | POA: Insufficient documentation

## 2018-04-06 HISTORY — PX: ROBOTIC ASSISTED LAPAROSCOPIC VENTRAL/INCISIONAL HERNIA REPAIR: SHX6607

## 2018-04-06 LAB — PROTIME-INR
INR: 1.1
Prothrombin Time: 14.1 seconds (ref 11.4–15.2)

## 2018-04-06 SURGERY — ROBOTIC ASSISTED LAPAROSCOPIC VENTRAL/INCISIONAL HERNIA REPAIR
Anesthesia: General | Laterality: Right | Wound class: "Clean "

## 2018-04-06 MED ORDER — FENTANYL CITRATE (PF) 100 MCG/2ML IJ SOLN
INTRAMUSCULAR | Status: AC
  Filled 2018-04-06: qty 2

## 2018-04-06 MED ORDER — ACETAMINOPHEN 500 MG PO TABS
ORAL_TABLET | ORAL | Status: AC
  Administered 2018-04-06: 1000 mg via ORAL
  Filled 2018-04-06: qty 2

## 2018-04-06 MED ORDER — CEFAZOLIN SODIUM-DEXTROSE 2-4 GM/100ML-% IV SOLN
INTRAVENOUS | Status: AC
  Filled 2018-04-06: qty 100

## 2018-04-06 MED ORDER — CHLORHEXIDINE GLUCONATE CLOTH 2 % EX PADS: 6.0000 | MEDICATED_PAD | Freq: Once | CUTANEOUS | Status: DC

## 2018-04-06 MED ORDER — FENTANYL CITRATE (PF) 100 MCG/2ML IJ SOLN
INTRAMUSCULAR | Status: DC | PRN
  Administered 2018-04-06 (×4): 50 ug via INTRAVENOUS

## 2018-04-06 MED ORDER — BUPIVACAINE LIPOSOME 1.3 % IJ SUSP
INTRAMUSCULAR | Status: AC
  Filled 2018-04-06: qty 20

## 2018-04-06 MED ORDER — ONDANSETRON HCL 4 MG/2ML IJ SOLN: 4.0000 mg | Freq: Once | INTRAMUSCULAR | Status: DC | PRN

## 2018-04-06 MED ORDER — DEXAMETHASONE SODIUM PHOSPHATE 10 MG/ML IJ SOLN
INTRAMUSCULAR | Status: DC | PRN
  Administered 2018-04-06: 6 mg via INTRAVENOUS

## 2018-04-06 MED ORDER — HYDROCODONE-ACETAMINOPHEN 5-325 MG PO TABS: 1.0000 | ORAL_TABLET | Freq: Four times a day (QID) | ORAL | 0 refills | Status: DC | PRN

## 2018-04-06 MED ORDER — FAMOTIDINE 20 MG PO TABS
ORAL_TABLET | ORAL | Status: AC
  Filled 2018-04-06: qty 1

## 2018-04-06 MED ORDER — BUPIVACAINE LIPOSOME 1.3 % IJ SUSP
INTRAMUSCULAR | Status: DC | PRN
  Administered 2018-04-06: 20 mL

## 2018-04-06 MED ORDER — BUPIVACAINE HCL 0.25 % IJ SOLN
INTRAMUSCULAR | Status: DC | PRN
  Administered 2018-04-06: 30 mL

## 2018-04-06 MED ORDER — FENTANYL CITRATE (PF) 100 MCG/2ML IJ SOLN
INTRAMUSCULAR | Status: AC
  Administered 2018-04-06: 25 ug via INTRAVENOUS
  Filled 2018-04-06: qty 2

## 2018-04-06 MED ORDER — CEFAZOLIN SODIUM-DEXTROSE 2-4 GM/100ML-% IV SOLN
2.0000 g | INTRAVENOUS | Status: AC
Start: 2018-04-06 — End: 2018-04-06
  Administered 2018-04-06: 2 g via INTRAVENOUS

## 2018-04-06 MED ORDER — PROPOFOL 10 MG/ML IV BOLUS
INTRAVENOUS | Status: DC | PRN
  Administered 2018-04-06: 150 mg via INTRAVENOUS

## 2018-04-06 MED ORDER — PROPOFOL 10 MG/ML IV BOLUS
INTRAVENOUS | Status: AC
  Filled 2018-04-06: qty 20

## 2018-04-06 MED ORDER — GABAPENTIN 300 MG PO CAPS
ORAL_CAPSULE | ORAL | Status: AC
  Filled 2018-04-06: qty 1

## 2018-04-06 MED ORDER — FENTANYL CITRATE (PF) 100 MCG/2ML IJ SOLN
25.0000 ug | INTRAMUSCULAR | Status: DC | PRN
  Administered 2018-04-06 (×2): 25 ug via INTRAVENOUS

## 2018-04-06 MED ORDER — ACETAMINOPHEN 500 MG PO TABS
1000.0000 mg | ORAL_TABLET | ORAL | Status: AC
  Administered 2018-04-06: 1000 mg via ORAL

## 2018-04-06 MED ORDER — BUPIVACAINE HCL (PF) 0.25 % IJ SOLN
INTRAMUSCULAR | Status: AC
  Filled 2018-04-06: qty 30

## 2018-04-06 MED ORDER — FENTANYL CITRATE (PF) 100 MCG/2ML IJ SOLN
INTRAMUSCULAR | Status: AC
Start: 2018-04-06 — End: ?
  Filled 2018-04-06: qty 2

## 2018-04-06 MED ORDER — HYDRALAZINE HCL 20 MG/ML IJ SOLN
10.0000 mg | Freq: Once | INTRAMUSCULAR | Status: AC
  Administered 2018-04-06: 10 mg via INTRAVENOUS

## 2018-04-06 MED ORDER — LIDOCAINE HCL (CARDIAC) PF 100 MG/5ML IV SOSY
PREFILLED_SYRINGE | INTRAVENOUS | Status: DC | PRN
  Administered 2018-04-06: 100 mg via INTRAVENOUS

## 2018-04-06 MED ORDER — ROCURONIUM BROMIDE 100 MG/10ML IV SOLN
INTRAVENOUS | Status: DC | PRN
  Administered 2018-04-06 (×2): 10 mg via INTRAVENOUS
  Administered 2018-04-06: 20 mg via INTRAVENOUS
  Administered 2018-04-06: 50 mg via INTRAVENOUS

## 2018-04-06 MED ORDER — HYDRALAZINE HCL 20 MG/ML IJ SOLN
INTRAMUSCULAR | Status: AC
  Administered 2018-04-06: 10 mg via INTRAVENOUS
  Filled 2018-04-06: qty 1

## 2018-04-06 MED ORDER — LACTATED RINGERS IV SOLN
INTRAVENOUS | Status: DC
  Administered 2018-04-06: 10:00:00 via INTRAVENOUS

## 2018-04-06 MED ORDER — PHENYLEPHRINE HCL 10 MG/ML IJ SOLN
INTRAMUSCULAR | Status: DC | PRN
  Administered 2018-04-06: 200 ug via INTRAVENOUS
  Administered 2018-04-06: 100 ug via INTRAVENOUS
  Administered 2018-04-06: 200 ug via INTRAVENOUS
  Administered 2018-04-06: 50 ug via INTRAVENOUS
  Administered 2018-04-06: 100 ug via INTRAVENOUS

## 2018-04-06 MED ORDER — PHENYLEPHRINE HCL 10 MG/ML IJ SOLN: INTRAMUSCULAR | Status: DC | PRN

## 2018-04-06 MED ORDER — SUGAMMADEX SODIUM 200 MG/2ML IV SOLN
INTRAVENOUS | Status: DC | PRN
  Administered 2018-04-06: 200 mg via INTRAVENOUS

## 2018-04-06 MED ORDER — ONDANSETRON HCL 4 MG/2ML IJ SOLN
INTRAMUSCULAR | Status: DC | PRN
  Administered 2018-04-06: 4 mg via INTRAVENOUS

## 2018-04-06 MED ORDER — BUPIVACAINE LIPOSOME 1.3 % IJ SUSP
20.0000 mL | Freq: Once | INTRAMUSCULAR | Status: DC
  Filled 2018-04-06: qty 20

## 2018-04-06 MED ORDER — EPHEDRINE SULFATE 50 MG/ML IJ SOLN
INTRAMUSCULAR | Status: DC | PRN
  Administered 2018-04-06: 5 mg via INTRAVENOUS
  Administered 2018-04-06: 10 mg via INTRAVENOUS
  Administered 2018-04-06: 5 mg via INTRAVENOUS

## 2018-04-06 MED ORDER — FAMOTIDINE 20 MG PO TABS
20.0000 mg | ORAL_TABLET | Freq: Once | ORAL | Status: AC
  Administered 2018-04-06: 20 mg via ORAL

## 2018-04-06 MED ORDER — SEVOFLURANE IN SOLN
RESPIRATORY_TRACT | Status: AC
  Filled 2018-04-06: qty 250

## 2018-04-06 MED ORDER — GABAPENTIN 300 MG PO CAPS
300.0000 mg | ORAL_CAPSULE | ORAL | Status: AC
  Administered 2018-04-06: 300 mg via ORAL

## 2018-04-06 SURGICAL SUPPLY — 51 items
ADH SKN CLS APL DERMABOND .7 (GAUZE/BANDAGES/DRESSINGS) ×1
CANISTER SUCT 1200ML W/VALVE (MISCELLANEOUS) ×2 IMPLANT
CANNULA SEALS 8.5MM (CANNULA) ×1
CHLORAPREP W/TINT 26ML (MISCELLANEOUS) ×2 IMPLANT
CORD BIP STRL DISP 12FT (MISCELLANEOUS) ×2 IMPLANT
DEFOGGER SCOPE WARMER CLEARIFY (MISCELLANEOUS) ×2 IMPLANT
DERMABOND ADVANCED (GAUZE/BANDAGES/DRESSINGS) ×1
DERMABOND ADVANCED .7 DNX12 (GAUZE/BANDAGES/DRESSINGS) IMPLANT
DRAPE 3 ARM ACCESS DA VINCI (DRAPES) ×1
DRAPE 3 ARM ACCESS DVNC (DRAPES) ×1 IMPLANT
DRAPE SHEET LG 3/4 BI-LAMINATE (DRAPES) ×6 IMPLANT
DRSG TEGADERM 2-3/8X2-3/4 SM (GAUZE/BANDAGES/DRESSINGS) ×8 IMPLANT
DRSG TELFA 4X3 1S NADH ST (GAUZE/BANDAGES/DRESSINGS) ×2 IMPLANT
ELECT REM PT RETURN 9FT ADLT (ELECTROSURGICAL) ×2
ELECTRODE REM PT RTRN 9FT ADLT (ELECTROSURGICAL) ×1 IMPLANT
GLOVE BIO SURGEON STRL SZ7 (GLOVE) ×8 IMPLANT
GOWN STRL REUS W/ TWL LRG LVL3 (GOWN DISPOSABLE) ×3 IMPLANT
GOWN STRL REUS W/TWL LRG LVL3 (GOWN DISPOSABLE) ×6
GRASPER SUT TROCAR 14GX15 (MISCELLANEOUS) ×2 IMPLANT
IRRIGATION STRYKERFLOW (MISCELLANEOUS) IMPLANT
IRRIGATOR STRYKERFLOW (MISCELLANEOUS) ×2
IV NS 1000ML (IV SOLUTION) ×2
IV NS 1000ML BAXH (IV SOLUTION) ×1 IMPLANT
KIT PINK PAD W/HEAD ARE REST (MISCELLANEOUS) ×2
KIT PINK PAD W/HEAD ARM REST (MISCELLANEOUS) ×1 IMPLANT
LABEL OR SOLS (LABEL) ×2 IMPLANT
MESH 3DMAX 4X6 RT LRG (Mesh General) ×1 IMPLANT
MESH VENTRALEX ST 1-7/10 CRC S (Mesh General) ×1 IMPLANT
NDL FILTER BLUNT 18X1 1/2 (NEEDLE) ×1 IMPLANT
NDL HYPO 25X1 1.5 SAFETY (NEEDLE) ×1 IMPLANT
NEEDLE FILTER BLUNT 18X 1/2SAF (NEEDLE) ×1
NEEDLE FILTER BLUNT 18X1 1/2 (NEEDLE) ×1 IMPLANT
NEEDLE HYPO 25X1 1.5 SAFETY (NEEDLE) ×2 IMPLANT
PACK LAP CHOLECYSTECTOMY (MISCELLANEOUS) ×2 IMPLANT
PROGRASP ENDOWRIST DA VINCI (INSTRUMENTS) ×1
PROGRASP ENDOWRIST DVNC (INSTRUMENTS) ×1 IMPLANT
SEAL CANN 8.5 DVNC (CANNULA) ×1 IMPLANT
SLEEVE ADV FIXATION 5X100MM (TROCAR) ×2 IMPLANT
SOLUTION ELECTROLUBE (MISCELLANEOUS) ×2 IMPLANT
SPONGE LAP 18X18 5 PK (GAUZE/BANDAGES/DRESSINGS) ×3 IMPLANT
STRAP SAFETY 5IN WIDE (MISCELLANEOUS) ×2 IMPLANT
SUT ETHIBOND 0 MO6 C/R (SUTURE) ×1 IMPLANT
SUT MNCRL 4-0 (SUTURE) ×2
SUT MNCRL 4-0 27XMFL (SUTURE) ×1
SUT VIC AB 0 CT2 27 (SUTURE) ×2 IMPLANT
SUT VLOC 180 2-0 9IN GS21 (SUTURE) ×12 IMPLANT
SUT VLOC 90 2/L VL 12 GS22 (SUTURE) ×2 IMPLANT
SUTURE MNCRL 4-0 27XMF (SUTURE) ×1 IMPLANT
SYR 3ML LL SCALE MARK (SYRINGE) ×2 IMPLANT
TROCAR XCEL NON-BLD 5MMX100MML (ENDOMECHANICALS) ×2 IMPLANT
TUBING INSUF HEATED (TUBING) ×2 IMPLANT

## 2018-04-06 NOTE — Transfer of Care (Signed)
Immediate Anesthesia Transfer of Care Note  Patient: Jonathon Welch  Procedure(s) Performed: ROBOTIC ASSISTED LAPAROSCOPIC VENTRAL/INCISIONAL HERNIA REPAIR AND RIGHT INGUINAL HERNIA (Right )  Patient Location: PACU  Anesthesia Type:General  Level of Consciousness: sedated  Airway & Oxygen Therapy: Patient Spontanous Breathing and Patient connected to face mask oxygen  Post-op Assessment: Report given to RN and Post -op Vital signs reviewed and stable  Post vital signs: Reviewed and stable  Last Vitals:  Vitals Value Taken Time  BP    Temp    Pulse 55 04/06/2018  2:56 PM  Resp 10 04/06/2018  2:56 PM  SpO2 100 % 04/06/2018  2:56 PM  Vitals shown include unvalidated device data.  Last Pain:  Vitals:   04/06/18 0856  TempSrc: Tympanic  PainSc: 0-No pain         Complications: No apparent anesthesia complications

## 2018-04-06 NOTE — Progress Notes (Signed)
Dr. Rosey Bath came and saw patient, patient aroused very minimally. Ordered EKG. Informed of patients BP elevated. Dr. Rosey Bath to be called if patients BP goes above 200.

## 2018-04-06 NOTE — Anesthesia Postprocedure Evaluation (Signed)
Anesthesia Post Note  Patient: Jonathon Welch  Procedure(s) Performed: ROBOTIC ASSISTED LAPAROSCOPIC VENTRAL/INCISIONAL HERNIA REPAIR AND RIGHT INGUINAL HERNIA (Right )  Patient location during evaluation: PACU Anesthesia Type: General Level of consciousness: awake and alert Pain management: pain level controlled Vital Signs Assessment: post-procedure vital signs reviewed and stable Respiratory status: spontaneous breathing, nonlabored ventilation, respiratory function stable and patient connected to nasal cannula oxygen Cardiovascular status: blood pressure returned to baseline and stable Postop Assessment: no apparent nausea or vomiting Anesthetic complications: no     Last Vitals:  Vitals:   04/06/18 1713 04/06/18 1757  BP: (!) 163/90 (!) 176/87  Pulse: 66 69  Resp: 16 16  Temp: (!) 36 C   SpO2: 100% 100%    Last Pain:  Vitals:   04/06/18 1713  TempSrc:   PainSc: 3                  Martha Clan

## 2018-04-06 NOTE — Anesthesia Preprocedure Evaluation (Signed)
Anesthesia Evaluation  Patient identified by MRN, date of birth, ID band Patient awake    Reviewed: Allergy & Precautions, Patient's Chart, lab work & pertinent test results  History of Anesthesia Complications Negative for: history of anesthetic complications  Airway Mallampati: III       Dental   Pulmonary neg sleep apnea, neg COPD, former smoker,           Cardiovascular hypertension, Pt. on medications +CHF  (-) Past MI + dysrhythmias Atrial Fibrillation + Valvular Problems/Murmurs (s/p aortic valve replacement)      Neuro/Psych neg Seizures Depression Parkinson's    GI/Hepatic Neg liver ROS, neg GERD  ,  Endo/Other  neg diabetes  Renal/GU Renal disease (Polycystic kidney dz)     Musculoskeletal  (+) Arthritis ,   Abdominal   Peds  Hematology  (+) anemia ,   Anesthesia Other Findings   Reproductive/Obstetrics                             Anesthesia Physical Anesthesia Plan  ASA: III  Anesthesia Plan: General   Post-op Pain Management:    Induction: Intravenous  PONV Risk Score and Plan:   Airway Management Planned: Oral ETT  Additional Equipment:   Intra-op Plan:   Post-operative Plan:   Informed Consent: I have reviewed the patients History and Physical, chart, labs and discussed the procedure including the risks, benefits and alternatives for the proposed anesthesia with the patient or authorized representative who has indicated his/her understanding and acceptance.     Plan Discussed with:   Anesthesia Plan Comments:         Anesthesia Quick Evaluation

## 2018-04-06 NOTE — OR Nursing (Signed)
Patient reports he has been bridged with lovenox but is unsure of the dose.

## 2018-04-06 NOTE — Progress Notes (Signed)
Charge nurse called Dr. Rosey Bath to come look at patient. Patient is not responding to sternal rub.

## 2018-04-06 NOTE — Op Note (Signed)
Robotic assisted laparoscopic Right Inguinal Hernia Repair and Incisional hernia repair w mesh  Jonathon Welch  04/06/2018  Pre-operative Diagnosis: Bilateral Inguinal Hernia  Post-operative Diagnosis: Right  Inguinal hernia and ventral hernia  Procedure: 1. Robotic assisted laparoscopic transabdominal repair of Right inguinal hernia using 3D BARD mesh 2.  Open Incisional  hernia repair with mesh Using small round ventralex ST    Surgeon: Caroleen Hamman, MD FACS  Anesthesia: Gen. with endotracheal tube   Findings: LArge Right Pantaloon hernia ( direct and indirect component) Incisional hernia 1.5 cms    Procedure Details  The patient was seen again in the Holding Room. The benefits, complications, treatment options, and expected outcomes were discussed with the patient. The risks of bleeding, infection, recurrence of symptoms, failure to resolve symptoms, recurrence of hernia, ischemic orchitis, chronic pain syndrome or neuroma, were discussed again. The likelihood of improving the patient's symptoms with return to their baseline status is good.  The patient and/or family concurred with the proposed plan, giving informed consent.  The patient was taken to Operating Room, identified as Jonathon Welch and the procedure verified as Laparoscopic Inguinal Hernia Repair. Laterality confirmed.  A Time Out was held and the above information confirmed.  Prior to the induction of general anesthesia, antibiotic prophylaxis was administered. VTE prophylaxis was in place. General endotracheal anesthesia was then administered and tolerated well. After the induction, the abdomen was prepped with Chloraprep and draped in the sterile fashion. The patient was positioned in the supine position.  The incisional hernia defect was located in the midline and incision was created over it.  Using electrocautery we dissected the hernia sac from adjacent structures and were able to reduce its contents to the  abdominal cavity.  We opened the hernia sac and excise it.  There was some small bowel that we preserve the.  Were able to reduce all the contents and place 2 anchoring stitches to the fascia for our [facility] trocar.  It was entered under direct visualization and the laparoscope was inserted.  2 additional 8 mm ports were placed on the size of the abdominal wall under direct visualization. Laparoscopy revealed a large right direct and indirect defect no evidence of any hernia defects on the left side.  Decision was made to only perform the right side.  The robot was brought to the surgical field and was docked in the standard fashion.  Instruments were placed and kept under direct visualization at all times.  We avoided any collision between the robotic arms at all times . Arterial flap was created using scissors with division of the peritoneum from a lateral to medial fashion.  Dissection was performed to delineate Cooper's ligament and the lateral extent of dissection was determined . The nerve on the lateral abdominal wall was identified and kept in view at all times. The cord was skeletonized of the indirect sac and cord lipoma which was retracted cephalad.  We identified the vas deferens and the epigastric vessels and we preserve it at all times.  Circumferential dissection of the sac was achieve and after tedious dissection of the hernia sac were finally able to reduce it.  We also were able to reduce the direct component to it.  Using a large 3D bar we fixed the defect and placed two 2-0 Vicryl sutures on the pubic tubercle in the standard fashion.  The mesh cover both the direct and indirect spaces and had a very well coverage of the inguinal floor. The peritoneal  defect was closed with a running V lock suture.   Once assuring that hemostasis was adequate the ports were removed and  The robot undocked and the instruments were removed.  She then was turned to the incisional defect where we  dissected the subtenons tissue and place the ventral X mesh in the standard fashion.  We secured with 4 interrupted Ethibond sutures on each corner.  The fascial defect was approximated then with multiple 0 Ethibond sutures.  The subtenons tissue was closed with 2 oh and all the incisions were closed with a 4-0 Monocryl in a sub-particular fashion.  Dermabond was used to coat all the skin incisions.   Patient tolerated the procedure well. There were no complications. He was taken to the recovery room in stable condition.               Caroleen Hamman, MD, FACS

## 2018-04-06 NOTE — Discharge Instructions (Signed)

## 2018-04-06 NOTE — Anesthesia Procedure Notes (Signed)
Procedure Name: Intubation Date/Time: 04/06/2018 10:57 AM Performed by: Philbert Riser, CRNA Pre-anesthesia Checklist: Patient identified, Emergency Drugs available, Suction available, Patient being monitored and Timeout performed Patient Re-evaluated:Patient Re-evaluated prior to induction Oxygen Delivery Method: Circle system utilized and Simple face mask Preoxygenation: Pre-oxygenation with 100% oxygen Induction Type: IV induction Ventilation: Mask ventilation without difficulty Laryngoscope Size: Mac and 3 Grade View: Grade I Tube type: Oral Tube size: 7.5 mm Number of attempts: 1 Airway Equipment and Method: Stylet Placement Confirmation: ETT inserted through vocal cords under direct vision Secured at: 22 cm Tube secured with: Tape Dental Injury: Teeth and Oropharynx as per pre-operative assessment

## 2018-04-06 NOTE — Anesthesia Post-op Follow-up Note (Signed)
Anesthesia QCDR form completed.        

## 2018-04-06 NOTE — Interval H&P Note (Signed)
History and Physical Interval Note:  04/06/2018 9:56 AM  [name]  has presented today for surgery, with the diagnosis of ventral hernia  The various methods of treatment have been discussed with the patient and family. After consideration of risks, benefits and other options for treatment, the patient has consented to  Procedure(s): ROBOTIC ASSISTED LAPAROSCOPIC VENTRAL/INCISIONAL HERNIA REPAIR AND BILATERAL INGUINAL HERNIA (Bilateral) as a surgical intervention .  The patient's history has been reviewed, patient examined, no change in status, stable for surgery.  I have reviewed the patient's chart and labs.  Questions were answered to the patient's satisfaction.     Jonathon Welch

## 2018-04-09 ENCOUNTER — Encounter: Payer: Self-pay | Admitting: Surgery

## 2018-04-09 NOTE — Addendum Note (Signed)
Addendum  created 04/09/18 1006 by Doreen Salvage, CRNA   Intraprocedure Meds edited

## 2018-04-10 LAB — SURGICAL PATHOLOGY

## 2018-04-16 ENCOUNTER — Other Ambulatory Visit: Payer: Self-pay

## 2018-04-18 ENCOUNTER — Telehealth: Payer: Self-pay | Admitting: Surgery

## 2018-04-18 NOTE — Telephone Encounter (Signed)
Patient's wife Lorna Dibble called and reported patient had ventrial hernia surgery on 04-06-18 by Dr Dahlia Byes. He is having some Bleeding when going to the bathroom. He has a p/o appointment on 04-23-18.  Please call & advise

## 2018-04-23 ENCOUNTER — Encounter: Payer: Self-pay | Admitting: Surgery

## 2018-04-23 ENCOUNTER — Ambulatory Visit (INDEPENDENT_AMBULATORY_CARE_PROVIDER_SITE_OTHER): Payer: Medicare Other | Admitting: Surgery

## 2018-04-23 VITALS — BP 154/83 | HR 55 | Temp 97.3°F | Ht 67.0 in | Wt 157.2 lb

## 2018-04-23 DIAGNOSIS — Z09 Encounter for follow-up examination after completed treatment for conditions other than malignant neoplasm: Secondary | ICD-10-CM

## 2018-04-23 NOTE — Progress Notes (Signed)
S/p robotic RIH and ventral hernia Doing well Had some hematochezia related to his coumadin but now has stopped after holding coumadin for 3 days Taking PO, + BM, no fevers  PE NAD Abd: soft, NT, incision c/d/i, no infection or recurrence. Subxiphoid hernia present, reducible and asymptomatic.  A/p Doing well RTC 2 months No surgical issues

## 2018-04-23 NOTE — Patient Instructions (Addendum)
WE would like to follow up with you in September. Please call our office late August to schedule an appointment for September.    GENERAL POST-OPERATIVE PATIENT INSTRUCTIONS   WOUND CARE INSTRUCTIONS:  Keep a dry clean dressing on the wound if there is drainage. The initial bandage may be removed after 24 hours.  Once the wound has quit draining you may leave it open to air.  If clothing rubs against the wound or causes irritation and the wound is not draining you may cover it with a dry dressing during the daytime.  Try to keep the wound dry and avoid ointments on the wound unless directed to do so.  If the wound becomes bright red and painful or starts to drain infected material that is not clear, please contact your physician immediately.  If the wound is mildly pink and has a thick firm ridge underneath it, this is normal, and is referred to as a healing ridge.  This will resolve over the next 4-6 weeks.  BATHING: You may shower if you have been informed of this by your surgeon. However, Please do not submerge in a tub, hot tub, or pool until incisions are completely sealed or have been told by your surgeon that you may do so.  DIET:  You may eat any foods that you can tolerate.  It is a good idea to eat a high fiber diet and take in plenty of fluids to prevent constipation.  If you do become constipated you may want to take a mild laxative or take ducolax tablets on a daily basis until your bowel habits are regular.  Constipation can be very uncomfortable, along with straining, after recent surgery.  ACTIVITY:  You are encouraged to cough and deep breath or use your incentive spirometer if you were given one, every 15-30 minutes when awake.  This will help prevent respiratory complications and low grade fevers post-operatively if you had a general anesthetic.  You may want to hug a pillow when coughing and sneezing to add additional support to the surgical area, if you had abdominal or chest  surgery, which will decrease pain during these times.  You are encouraged to walk and engage in light activity for the next two weeks.  You should not lift more than 20 pounds, until 05/18/2018 as it could put you at increased risk for complications.  Twenty pounds is roughly equivalent to a plastic bag of groceries. At that time- Listen to your body when lifting, if you have pain when lifting, stop and then try again in a few days. Soreness after doing exercises or activities of daily living is normal as you get back in to your normal routine.  MEDICATIONS:  Try to take narcotic medications and anti-inflammatory medications, such as tylenol, ibuprofen, naprosyn, etc., with food.  This will minimize stomach upset from the medication.  Should you develop nausea and vomiting from the pain medication, or develop a rash, please discontinue the medication and contact your physician.  You should not drive, make important decisions, or operate machinery when taking narcotic pain medication.  SUNBLOCK Use sun block to incision area over the next year if this area will be exposed to sun. This helps decrease scarring and will allow you avoid a permanent darkened area over your incision.  QUESTIONS:  Please feel free to call our office if you have any questions, and we will be glad to assist you. [phone]

## 2018-07-02 ENCOUNTER — Ambulatory Visit (INDEPENDENT_AMBULATORY_CARE_PROVIDER_SITE_OTHER): Payer: Medicare Other | Admitting: Surgery

## 2018-07-02 ENCOUNTER — Encounter: Payer: Self-pay | Admitting: Surgery

## 2018-07-02 VITALS — BP 179/79 | HR 67 | Temp 97.9°F | Ht 69.0 in | Wt 155.0 lb

## 2018-07-02 DIAGNOSIS — Z09 Encounter for follow-up examination after completed treatment for conditions other than malignant neoplasm: Secondary | ICD-10-CM

## 2018-07-02 NOTE — Patient Instructions (Addendum)
Return as needed.The patient is aware to call back for any questions or concerns.  

## 2018-07-03 ENCOUNTER — Encounter: Payer: Self-pay | Admitting: Surgery

## 2018-07-03 NOTE — Progress Notes (Signed)
S/p robotic inguinal and UH Doing very well He does have a subxiphoid hernia Recovering well, it did take him a couple of days to recover from anesthesia side effects Not having sxs related to subxiphoid hernia   PE NAD Abd: soft, nt, reducible subxiphoid, no recurrence of repaired hernia. No peritonitis or infection  A/p Doing well He wishes to wait before any surgical intervention is performed for the subxiphoid HE will call us back No surgical complications related to the hernias that were fixed

## 2018-07-17 ENCOUNTER — Encounter: Payer: Self-pay | Admitting: Speech Pathology

## 2018-07-17 ENCOUNTER — Other Ambulatory Visit: Payer: Self-pay

## 2018-07-17 ENCOUNTER — Ambulatory Visit: Payer: Medicare Other | Attending: Neurology | Admitting: Speech Pathology

## 2018-07-17 DIAGNOSIS — R490 Dysphonia: Secondary | ICD-10-CM | POA: Insufficient documentation

## 2018-07-17 NOTE — Therapy (Signed)
[place] [facility] [address] [place], [place], [address] Phone: [phone]   Fax:  [phone]  Speech Language Pathology Evaluation  Patient Details  Name: Jonathon Welch MRN: [id number] Date of Birth: 1936/09/10 Referring Provider (SLP): [facility] [place]    Encounter Date: 07/17/2018  End of Session - 07/17/18 1611    Visit Number  1    Number of Visits  17    Date for SLP Re-Evaluation  09/16/18    SLP Start Time  8546    SLP Stop Time   1600    SLP Time Calculation (min)  45 min    Activity Tolerance  Patient tolerated treatment well       Past Medical History:  Diagnosis Date   Anemia    vitamin b12 deficiency   Aortic valve disease    Atrial fibrillation ([place])    Cancer ([place])    basal and squamous cell skin cancer   DDD (degenerative disc disease), lumbar 07/25/2016   Depression    Dysrhythmia    atrial fibrillation   Herniation through surgical site    Hyperlipidemia    Hypertension    Left inguinal hernia 2019   Migraine headache    less frequent lately   Parkinson disease ([place])    Polycystic kidney disease    Right inguinal hernia    Tremor    Ventral hernia 2019    Past Surgical History:  Procedure Laterality Date   AORTIC AND MITRAL VALVE REPLACEMENT N/A    APPENDECTOMY     ASCENDING AORTIC ANEURYSM REPAIR W/ MECHANICAL AORTIC VALVE REPLACEMENT  1985   aneurysm repaired when replaced "blown out" tissue valve   CARDIAC VALVE REPLACEMENT  1980 and 1985   aortic valve replaced, mechanical valve   CORONARY ARTERY BYPASS GRAFT  1980 and 1985   aortic valve replaced not CABG   MOHS SURGERY     nose and ear   ROBOTIC ASSISTED LAPAROSCOPIC VENTRAL/INCISIONAL HERNIA REPAIR Right 04/06/2018   Procedure: ROBOTIC ASSISTED LAPAROSCOPIC VENTRAL/INCISIONAL HERNIA REPAIR AND RIGHT INGUINAL HERNIA;  Surgeon: Jules Husbands, MD;  Location: [facility] ORS;  Service: General;  Laterality:  Right;    There were no vitals filed for this visit.      SLP Evaluation [facility] - 07/17/18 0001      SLP Visit Information   SLP Received On  07/17/18    Referring Provider (SLP)  [facility], [facility]     Onset Date  06/25/2018      Subjective   Subjective  Patient reports that his wife has trouble hearing him and that he can no longer sing with the choir.    Patient/Family Stated Goal  To be heard, resume singing for pleasure      Pain Assessment   Currently in Pain?  No/denies      General Information   HPI  82 year old man diagnosed with Parkinson's disease.      Prior Functional Status   Cognitive/Linguistic Baseline  Baseline deficits    Baseline deficit details  Dysphonia secondary Parkinson's disease      Cognition   Overall Cognitive Status  Within Functional Limits for tasks assessed      Oral Motor/Sensory Function   Overall Oral Motor/Sensory Function  Appears within functional limits for tasks assessed      Motor Speech   Overall Motor Speech  Impaired    Respiration  Impaired    Level of Impairment  Conversation    Phonation  Low vocal intensity;Hoarse    Resonance  Within functional limits    Articulation  Within functional limitis    Intelligibility  Intelligible    Motor Planning  Witnin functional limits    Motor Speech Errors  Not applicable    Phonation  Impaired    Volume  Soft    Pitch  High      Standardized Assessments   Standardized Assessments   Other Assessment   Perceptual Voice Evaluation      Perceptual Voice Evaluation Maximum phonation time for sustained ah: 10 seconds Mean intensity during sustained ah: 76 dB  Mean intensity sustained during conversational speech: 65 dB Highest dynamic pitch when altering pitch from a low note to a high note: 543 Hz Highest pitch during conversational speech: 238 Hz Lowest dynamic pitch when altering from a high note to a low note: 125 Hz Lowest pitch during conversational speech: 97  Hz  Average fundamental frequency during sustained ah: 170 Hz (within 1 STD for age and gender) Visi-Pitch: Museum/gallery exhibitions officer (MDVP)  MDVP extracts objective quantitative values (Relative Average Perturbation, Shimmer, Voice Turbulence Index, and Noise to Harmonic Ratio) on sustained phonation, which are displayed graphically and numerically in comparison to a built-in normative database.  The patient exhibited values within the norm for Relative Average Perturbation, Shimmer, Voice Turbulence Index, and Noise to Harmonic Ratio.  Average fundamental frequency was average for age and gender. Perceptually, his vocal quality was muffled.  The patient improved all parameters when cued to alter voicing (loud like me).  Simulatability: Improved vocal quality with loud voice.    SLP Education - 07/17/18 1610    Education provided  Yes    Education Details  Plan of care    Person(s) Educated  Patient    Methods  Explanation    Comprehension  Verbalized understanding         SLP Long Term Goals - 07/17/18 1615      SLP LONG TERM GOAL #1   Title   The patient will be independent for abdominal breathing and breath support exercises.     Time  8    Period  Weeks    Status  New    Target Date  09/16/18      SLP LONG TERM GOAL #2   Title   The patient will maximize voice quality and loudness using breath support/oral resonance for sustained vowel production, pitch glides, and hierarchal speech drill.     Time  8    Period  Weeks    Status  New    Target Date  09/16/18      SLP LONG TERM GOAL #3   Title  The patient will maximize voice quality and loudness using breath support/oral resonance for paragraph length recitation with 80% accuracy.     Time  8    Period  Weeks    Status  New    Target Date  09/16/18       Plan - 07/17/18 1613    Clinical Impression Statement  This 82 year old man diagnosed with Parkinson's disease is presenting with moderate voice disorder  characterized by hoarse vocal quality, hypophonia, and monotone voice.  Based on stimulability testing, the patient is judged to be a good candidate for improving vocal quality via high effort/high intensity vocal exercises.    Speech Therapy Frequency  2x / week    Duration  Other (comment)   8 weeks  Treatment/Interventions  Other (comment)   Voice therapy   Potential to Achieve Goals  Good    Potential Considerations  Ability to learn/carryover information;Cooperation/participation level;Previous level of function;Severity of impairments;Family/community support    Consulted and Agree with Plan of Care  Patient       Patient will benefit from skilled therapeutic intervention in order to improve the following deficits and impairments:   Dysphonia - Plan: SLP plan of care cert/re-cert    Problem List Patient Active Problem List   Diagnosis Date Noted   Non-recurrent inguinal hernia without obstruction or gangrene    Ventral hernia without obstruction or gangrene    B12 deficiency 07/25/2016   DDD (degenerative disc disease), lumbar 07/25/2016   Chronic atrial fibrillation 01/14/2016   Combined fat and carbohydrate induced hyperlipemia 01/14/2015   H/O aortic valve replacement 04/10/2014   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

## 2018-07-25 ENCOUNTER — Encounter: Payer: Medicare Other | Admitting: Speech Pathology

## 2018-07-27 ENCOUNTER — Encounter: Payer: Medicare Other | Admitting: Speech Pathology

## 2018-08-01 ENCOUNTER — Encounter: Payer: Medicare Other | Admitting: Speech Pathology

## 2018-08-07 ENCOUNTER — Encounter: Payer: Medicare Other | Admitting: Speech Pathology

## 2018-08-08 ENCOUNTER — Encounter: Payer: Medicare Other | Admitting: Speech Pathology

## 2018-08-15 ENCOUNTER — Encounter: Payer: Medicare Other | Admitting: Speech Pathology

## 2018-08-16 ENCOUNTER — Encounter: Payer: Medicare Other | Admitting: Speech Pathology

## 2020-11-18 ENCOUNTER — Other Ambulatory Visit: Payer: Self-pay

## 2020-11-18 ENCOUNTER — Ambulatory Visit
Admission: RE | Admit: 2020-11-18 | Discharge: 2020-11-18 | Disposition: A | Discharge status: Home / Self Care | Payer: Medicare Other | Source: Ambulatory Visit | Attending: Infectious Diseases | Admitting: Infectious Diseases

## 2020-11-18 ENCOUNTER — Other Ambulatory Visit (HOSPITAL_COMMUNITY): Payer: Self-pay | Admitting: Infectious Diseases

## 2020-11-18 ENCOUNTER — Other Ambulatory Visit: Payer: Self-pay | Admitting: Infectious Diseases

## 2020-11-18 DIAGNOSIS — I482 Chronic atrial fibrillation, unspecified: Secondary | ICD-10-CM | POA: Insufficient documentation

## 2020-11-18 DIAGNOSIS — I6782 Cerebral ischemia: Secondary | ICD-10-CM | POA: Diagnosis not present

## 2020-11-18 DIAGNOSIS — W1800XA Striking against unspecified object with subsequent fall, initial encounter: Secondary | ICD-10-CM | POA: Insufficient documentation

## 2021-01-12 ENCOUNTER — Other Ambulatory Visit (HOSPITAL_COMMUNITY): Payer: Self-pay | Admitting: Internal Medicine

## 2021-01-12 ENCOUNTER — Other Ambulatory Visit: Payer: Self-pay | Admitting: Internal Medicine

## 2021-01-12 DIAGNOSIS — N390 Urinary tract infection, site not specified: Secondary | ICD-10-CM

## 2021-01-13 ENCOUNTER — Other Ambulatory Visit: Payer: Self-pay

## 2021-01-13 ENCOUNTER — Ambulatory Visit
Admission: RE | Admit: 2021-01-13 | Discharge: 2021-01-13 | Disposition: A | Discharge status: Home / Self Care | Payer: Medicare Other | Source: Ambulatory Visit | Attending: Internal Medicine | Admitting: Internal Medicine

## 2021-01-13 ENCOUNTER — Other Ambulatory Visit: Payer: Self-pay | Admitting: Internal Medicine

## 2021-01-13 DIAGNOSIS — N390 Urinary tract infection, site not specified: Secondary | ICD-10-CM

## 2021-02-07 ENCOUNTER — Emergency Department
Admission: EM | Admit: 2021-02-07 | Discharge: 2021-02-07 | Disposition: A | Discharge status: Home / Self Care | Payer: Medicare Other | Attending: Emergency Medicine | Admitting: Emergency Medicine

## 2021-02-07 ENCOUNTER — Other Ambulatory Visit: Payer: Self-pay

## 2021-02-07 ENCOUNTER — Encounter: Payer: Self-pay | Admitting: Emergency Medicine

## 2021-02-07 DIAGNOSIS — Z85828 Personal history of other malignant neoplasm of skin: Secondary | ICD-10-CM | POA: Insufficient documentation

## 2021-02-07 DIAGNOSIS — Z91010 Allergy to peanuts: Secondary | ICD-10-CM | POA: Diagnosis not present

## 2021-02-07 DIAGNOSIS — N189 Chronic kidney disease, unspecified: Secondary | ICD-10-CM | POA: Diagnosis not present

## 2021-02-07 DIAGNOSIS — Z87891 Personal history of nicotine dependence: Secondary | ICD-10-CM | POA: Insufficient documentation

## 2021-02-07 DIAGNOSIS — I130 Hypertensive heart and chronic kidney disease with heart failure and stage 1 through stage 4 chronic kidney disease, or unspecified chronic kidney disease: Secondary | ICD-10-CM | POA: Diagnosis not present

## 2021-02-07 DIAGNOSIS — Z79899 Other long term (current) drug therapy: Secondary | ICD-10-CM | POA: Diagnosis not present

## 2021-02-07 DIAGNOSIS — I509 Heart failure, unspecified: Secondary | ICD-10-CM | POA: Diagnosis not present

## 2021-02-07 DIAGNOSIS — Z951 Presence of aortocoronary bypass graft: Secondary | ICD-10-CM | POA: Insufficient documentation

## 2021-02-07 DIAGNOSIS — R339 Retention of urine, unspecified: Secondary | ICD-10-CM

## 2021-02-07 DIAGNOSIS — N3090 Cystitis, unspecified without hematuria: Secondary | ICD-10-CM

## 2021-02-07 DIAGNOSIS — G20 Parkinson's disease: Secondary | ICD-10-CM | POA: Insufficient documentation

## 2021-02-07 DIAGNOSIS — B9689 Other specified bacterial agents as the cause of diseases classified elsewhere: Secondary | ICD-10-CM | POA: Diagnosis not present

## 2021-02-07 DIAGNOSIS — Z7901 Long term (current) use of anticoagulants: Secondary | ICD-10-CM | POA: Insufficient documentation

## 2021-02-07 LAB — BASIC METABOLIC PANEL
Anion gap: 10 (ref 5–15)
BUN: 32 mg/dL — ABNORMAL HIGH (ref 8–23)
CO2: 25 mmol/L (ref 22–32)
Calcium: 9.5 mg/dL (ref 8.9–10.3)
Chloride: 100 mmol/L (ref 98–111)
Creatinine, Ser: 1.43 mg/dL — ABNORMAL HIGH (ref 0.61–1.24)
GFR, Estimated: 49 mL/min — ABNORMAL LOW (ref >60)
Glucose, Bld: 162 mg/dL — ABNORMAL HIGH (ref 70–99)
Potassium: 4.7 mmol/L (ref 3.5–5.1)
Sodium: 135 mmol/L (ref 135–145)

## 2021-02-07 LAB — URINALYSIS, COMPLETE (UACMP) WITH MICROSCOPIC
Bilirubin Urine: NEGATIVE
Glucose, UA: NEGATIVE mg/dL
Ketones, ur: 5 mg/dL — AB
Nitrite: POSITIVE — AB
Protein, ur: 100 mg/dL — AB
RBC / HPF: >50 RBC/hpf — ABNORMAL HIGH (ref 0–5)
Specific Gravity, Urine: 1.012 (ref 1.005–1.030)
Squamous Epithelial / HPF: NONE SEEN (ref 0–5)
WBC, UA: >50 WBC/hpf — ABNORMAL HIGH (ref 0–5)
pH: 6 (ref 5.0–8.0)

## 2021-02-07 LAB — CBC WITH DIFFERENTIAL/PLATELET
Abs Immature Granulocytes: 0.05 10*3/uL (ref 0.00–0.07)
Basophils Absolute: 0.1 10*3/uL (ref 0.0–0.1)
Basophils Relative: 1 %
Eosinophils Absolute: 0.1 10*3/uL (ref 0.0–0.5)
Eosinophils Relative: 1 %
HCT: 44.8 % (ref 39.0–52.0)
Hemoglobin: 14.6 g/dL (ref 13.0–17.0)
Immature Granulocytes: 1 %
Lymphocytes Relative: 13 %
Lymphs Abs: 1.4 10*3/uL (ref 0.7–4.0)
MCH: 28.5 pg (ref 26.0–34.0)
MCHC: 32.6 g/dL (ref 30.0–36.0)
MCV: 87.3 fL (ref 80.0–100.0)
Monocytes Absolute: 0.7 10*3/uL (ref 0.1–1.0)
Monocytes Relative: 7 %
Neutro Abs: 8.3 10*3/uL — ABNORMAL HIGH (ref 1.7–7.7)
Neutrophils Relative %: 77 %
Platelets: 201 10*3/uL (ref 150–400)
RBC: 5.13 MIL/uL (ref 4.22–5.81)
RDW: 14 % (ref 11.5–15.5)
WBC: 10.5 10*3/uL (ref 4.0–10.5)
nRBC: 0 % (ref 0.0–0.2)

## 2021-02-07 MED ORDER — DOXYCYCLINE HYCLATE 100 MG PO CAPS: 100.0000 mg | ORAL_CAPSULE | Freq: Two times a day (BID) | ORAL | 0 refills | Status: AC

## 2021-02-07 NOTE — ED Provider Notes (Signed)
[facility] Emergency Department Provider Note  ____________________________________________  Time seen: Approximately 9:51 AM  I have reviewed the triage vital signs and the nursing notes.   HISTORY  Chief Complaint Urinary Retention    HPI Jonathon Welch is a 85 y.o. male with a history of atrial fibrillation on Eliquis, chronic CHF, CKD, Parkinson's disease who comes ED complaining of inability to urinate since yesterday.  This is in the setting of having a UTI for the past 2 months, monitor multiple courses of antibiotics, currently managed by primary care.  Denies fevers chills chest pain or shortness of breath.  No back pain.  Eating and drinking normally, no vomiting.  Symptoms are waxing and waning, no aggravating or alleviating factors.  Moderate intensity suprapubic discomfort.      Past Medical History:  Diagnosis Date  . Anemia    vitamin b12 deficiency  . Aortic valve disease   . Atrial fibrillation ([place])   . Cancer ([place])    basal and squamous cell skin cancer  . DDD (degenerative disc disease), lumbar 07/25/2016  . Depression   . Dysrhythmia    atrial fibrillation  . Herniation through surgical site   . Hyperlipidemia   . Hypertension   . Left inguinal hernia 2019  . Migraine headache    less frequent lately  . Parkinson disease ([place])   . Polycystic kidney disease   . Right inguinal hernia   . Tremor   . Ventral hernia 2019     Patient Active Problem List   Diagnosis Date Noted  . Non-recurrent inguinal hernia without obstruction or gangrene   . Ventral hernia without obstruction or gangrene   . B12 deficiency 07/25/2016  . DDD (degenerative disc disease), lumbar 07/25/2016  . Chronic atrial fibrillation ([place]) 01/14/2016  . Combined fat and carbohydrate induced hyperlipemia 01/14/2015  . H/O aortic valve replacement 04/10/2014  . 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 Aortic valve defect 03/21/2012  . Hypertension 03/21/2012   . Benign essential HTN 04/14/2008  . Benign prostatic hypertrophy without urinary obstruction 04/13/2008  . Parkinson's disease ([place]) 04/13/2008     Past Surgical History:  Procedure Laterality Date  . AORTIC AND MITRAL VALVE REPLACEMENT N/A   . APPENDECTOMY    . ASCENDING AORTIC ANEURYSM REPAIR W/ MECHANICAL AORTIC VALVE REPLACEMENT  1985   aneurysm repaired when replaced "blown out" tissue valve  . CARDIAC VALVE REPLACEMENT  1980 and 1985   aortic valve replaced, mechanical valve  . CORONARY ARTERY BYPASS GRAFT  1980 and 1985   aortic valve replaced not CABG  . MOHS SURGERY     nose and ear  . ROBOTIC ASSISTED LAPAROSCOPIC VENTRAL/INCISIONAL HERNIA REPAIR Right 04/06/2018   Procedure: ROBOTIC ASSISTED LAPAROSCOPIC VENTRAL/INCISIONAL HERNIA REPAIR AND RIGHT INGUINAL HERNIA;  Surgeon: Jules Husbands, MD;  Location: [facility] ORS;  Service: General;  Laterality: Right;     Prior to Admission medications   Medication Sig Start Date End Date Taking? Authorizing Provider  doxycycline (VIBRAMYCIN) 100 MG capsule Take 1 capsule (100 mg total) by mouth 2 (two) times daily for 10 days. 02/07/21 02/17/21 Yes Carrie Mew, MD  acetaminophen (TYLENOL) 500 MG tablet Take 500 mg by mouth daily as needed for moderate pain or headache.    [provider]  carbidopa-levodopa (SINEMET CR) 50-200 MG tablet Take 1 tablet by mouth at bedtime.    [provider]  carbidopa-levodopa (SINEMET IR) 25-100 MG tablet Take 1.5 tablets by mouth 3 (three) times daily.  01/12/16   [provider]  Cholecalciferol (VITAMIN D) 2000 units tablet Take 2,000 Units by mouth daily.    [provider]  Coenzyme Q10 400 MG CAPS Take by mouth. 05/27/10   [provider]  selegiline (ELDEPRYL) 5 MG capsule Take 5 mg by mouth 2 (two) times daily.  06/07/16   [provider]  warfarin (COUMADIN) 5 MG tablet Take 2.5-5 tablets by mouth See admin instructions. Take 2.5 mg by mouth  in the evening on Tue and Thur.  Take 5 mg every evening on Mon, Wed, Fri, Sat, and Sun    [provider]     Allergies Peanut-containing drug products   Family History  Problem Relation Age of Onset  . Heart disease Mother   . Hypertension Mother   . Stroke Father   . Kidney disease Sister     Social History Social History   Tobacco Use  . Smoking status: Former Smoker    Packs/day: 2.00    Years: 11.00    Pack years: 22.00    Quit date: 1971    Years since quitting: 51.3  . Smokeless tobacco: Never Used  Vaping Use  . Vaping Use: Never used  Substance Use Topics  . Alcohol use: No    Comment: occasional beer  . Drug use: No    Review of Systems  Constitutional:   No fever or chills.  ENT:   No sore throat. No rhinorrhea. Cardiovascular:   No chest pain or syncope. Respiratory:   No dyspnea or cough. Gastrointestinal:   Positive as above for lower abdominal pain  without vomiting and diarrhea.  Musculoskeletal:   Negative for focal pain or swelling All other systems reviewed and are negative except as documented above in ROS and HPI.  ____________________________________________   PHYSICAL EXAM:  VITAL SIGNS: ED Triage Vitals  Enc Vitals Group     BP 02/07/21 0733 (!) 192/119     Pulse Rate 02/07/21 0733 81     Resp 02/07/21 0733 17     Temp 02/07/21 0733 98.3 F (36.8 C)     Temp Source 02/07/21 0733 Oral     SpO2 02/07/21 0733 98 %     Weight 02/07/21 0728 160 lb (72.6 kg)     Height 02/07/21 0728 5\' 8"  (1.727 m)     Head Circumference --      Peak Flow --      Pain Score 02/07/21 0728 6     Pain Loc --      Pain Edu? --      Excl. in [place]? --     Vital signs reviewed, nursing assessments reviewed.   Constitutional:   Alert and oriented. Non-toxic appearance. Eyes:   Conjunctivae are normal. EOMI. PERRL. ENT      Head:   Normocephalic and atraumatic.      Nose:   Wearing a mask.      Mouth/Throat:   Wearing a mask.      Neck:    No meningismus. Full ROM. Hematological/Lymphatic/Immunilogical:   No cervical lymphadenopathy. Cardiovascular:   Irregularly irregular rhythm. Symmetric bilateral radial and DP pulses.  No murmurs. Cap refill less than 2 seconds. Respiratory:   Normal respiratory effort without tachypnea/retractions. Breath sounds are clear and equal bilaterally. No wheezes/rales/rhonchi. Gastrointestinal:   Soft and nontender. Non distended. There is no CVA tenderness.  No rebound, rigidity, or guarding. Genitourinary:   deferred Musculoskeletal:   Normal range of motion in all  extremities. No joint effusions.  No lower extremity tenderness.  No edema. Neurologic:   Normal speech and language.  Motor grossly intact. No acute focal neurologic deficits are appreciated.  Skin:    Skin is warm, dry and intact. No rash noted.  No petechiae, purpura, or bullae.  ____________________________________________    LABS (pertinent positives/negatives) (all labs ordered are listed, but only abnormal results are displayed) Labs Reviewed  BASIC METABOLIC PANEL - Abnormal; Notable for the following components:      Result Value   Glucose, Bld 162 (*)    BUN 32 (*)    Creatinine, Ser 1.43 (*)    GFR, Estimated 49 (*)    All other components within normal limits  CBC WITH DIFFERENTIAL/PLATELET - Abnormal; Notable for the following components:   Neutro Abs 8.3 (*)    All other components within normal limits  URINALYSIS, COMPLETE (UACMP) WITH MICROSCOPIC - Abnormal; Notable for the following components:   Color, Urine YELLOW (*)    APPearance TURBID (*)    Hgb urine dipstick LARGE (*)    Ketones, ur 5 (*)    Protein, ur 100 (*)    Nitrite POSITIVE (*)    Leukocytes,Ua LARGE (*)    RBC / HPF >50 (*)    WBC, UA >50 (*)    Bacteria, UA FEW (*)    All other components within normal limits  URINE CULTURE   ____________________________________________   EKG    ____________________________________________     RADIOLOGY  No results found.  ____________________________________________   PROCEDURES Procedures  ____________________________________________    CLINICAL IMPRESSION / ASSESSMENT AND PLAN / ED COURSE  Medications ordered in the ED: Medications - No data to display  Pertinent labs & imaging results that were available during my care of the patient were reviewed by me and considered in my medical decision making (see chart for details).  Jonathon Welch was evaluated in Emergency Department on 02/07/2021 for the symptoms described in the history of present illness. He was evaluated in the context of the global COVID-19 pandemic, which necessitated consideration that the patient might be at risk for infection with the SARS-CoV-2 virus that causes COVID-19. Institutional protocols and algorithms that pertain to the evaluation of patients at risk for COVID-19 are in a state of rapid change based on information released by regulatory bodies including the CDC and federal and state organizations. These policies and algorithms were followed during the patient's care in the ED.     Clinical Course as of 02/07/21 9485  Nancy Fetter Feb 07, 2021  0741 Patient presents with inability to urinate, suprapubic pain, urgency.  Bladder scanner shows greater than 500.  Will place Foley and check serum labs. [PS]    Clinical Course User Index [PS] Carrie Mew, MD    ----------------------------------------- 9:52 AM on 02/07/2021 -----------------------------------------  Foley catheter free-flowing with purulent urine.  Vital signs are normal, patient is nontoxic and not septic.  Serum labs are unremarkable with baseline CKD, no leukocytosis.  Reviewed prior urine culture data from care everywhere which showed 2 UTIs which were pansensitive and 1 UTI which resistant to several antibiotics but sensitive to doxycycline and Cipro.  With patient's medication regimen, will prescribe doxycycline,  maintain Foley, follow-up with PCP and urology.   ____________________________________________   FINAL CLINICAL IMPRESSION(S) / ED DIAGNOSES    Final diagnoses:  Urinary retention  Cystitis  Chronic kidney disease, unspecified CKD stage  Parkinson's disease (HCC)  Chronic congestive heart failure, unspecified heart  failure type [facility])     ED Discharge Orders         Ordered    Ambulatory referral to Urology        02/07/21 0951    doxycycline (VIBRAMYCIN) 100 MG capsule  2 times daily        02/07/21 0951          Portions of this note were generated with dragon dictation software. Dictation errors may occur despite best attempts at proofreading.   Carrie Mew, MD 02/07/21 [phone]

## 2021-02-07 NOTE — ED Triage Notes (Signed)
Pt has been trying to get rid of a uti for 6-8 weeks now and has not been able to urinate since last pm

## 2021-02-09 LAB — URINE CULTURE: Culture: >=100000 — AB

## 2021-02-10 NOTE — Consult Note (Signed)
Ucx returned growing Citrobacter Koseri. Pt was discharged on doxycycline. Case discussed with Dr. Charna Archer, plan to call in prescription for Keflex to [place]. Spoke with patient and confirm pharmacy to call in prescription and explained the lab results and plan. Inform pt to stop taking doxycycline.   Thanks,   Eleonore Chiquito, PharmD, BCPS

## 2021-02-14 NOTE — Progress Notes (Addendum)
02/14/2021  10:32 PM   [name] 1936/09/22 [id number]  Referring provider: Rusty Aus, MD [name] [place],  [place] [id number] No chief complaint on file.   HPI: [name] is a 85 y.o. male who presents today for follow-up of a recent ED visit for urinary retention.   Presented to ED 02/07/2021 with a 24-hour history of difficulty voiding  History of recurrent pyuria with several antibiotic courses 2 months prior  Bladder scan with >500 mL prostate volume and Foley catheter was placed  Urinalysis with pyuria, nitrite +, discharged on doxycycline  Urine culture grew greater than 100,000 Citrobacter koseri; pansensitive but not tested to tetracycline  Renal ultrasound 01/13/2021 with multiple bilateral renal cysts and mild prostate enlargement  Four positive urine cultures since February 2022  States he has had no bothersome LUTS with these + urine cultures  Has Parkinson's but denies frequency, urgency, urge incontinence or voiding difficulties  Started a prostate OTC supplement last week    PMH: Past Medical History:  Diagnosis Date  . Anemia    vitamin b12 deficiency  . Aortic valve disease   . Atrial fibrillation ([place])   . Cancer ([place])    basal and squamous cell skin cancer  . DDD (degenerative disc disease), lumbar 07/25/2016  . Depression   . Dysrhythmia    atrial fibrillation  . Herniation through surgical site   . Hyperlipidemia   . Hypertension   . Left inguinal hernia 2019  . Migraine headache    less frequent lately  . Parkinson disease ([place])   . Polycystic kidney disease   . Right inguinal hernia   . Tremor   . Ventral hernia 2019    Surgical History: Past Surgical History:  Procedure Laterality Date  . AORTIC AND MITRAL VALVE REPLACEMENT N/A   . APPENDECTOMY    . ASCENDING AORTIC ANEURYSM REPAIR W/ MECHANICAL AORTIC VALVE REPLACEMENT  1985   aneurysm repaired when replaced  "blown out" tissue valve  . CARDIAC VALVE REPLACEMENT  1980 and 1985   aortic valve replaced, mechanical valve  . CORONARY ARTERY BYPASS GRAFT  1980 and 1985   aortic valve replaced not CABG  . MOHS SURGERY     nose and ear  . ROBOTIC ASSISTED LAPAROSCOPIC VENTRAL/INCISIONAL HERNIA REPAIR Right 04/06/2018   Procedure: ROBOTIC ASSISTED LAPAROSCOPIC VENTRAL/INCISIONAL HERNIA REPAIR AND RIGHT INGUINAL HERNIA;  Surgeon: Jules Husbands, MD;  Location: [facility] ORS;  Service: General;  Laterality: Right;    Home Medications:  Allergies as of 02/15/2021      Reactions   Peanut-containing Drug Products Other (See Comments)   Open sores inside of mouth previously. Stays away from peanuts now      Medication List       Accurate as of Feb 14, 2021 10:32 PM. If you have any questions, ask your nurse or doctor.        acetaminophen 500 MG tablet Commonly known as: TYLENOL Take 500 mg by mouth daily as needed for moderate pain or headache.   carbidopa-levodopa 50-200 MG tablet Commonly known as: SINEMET CR Take 1 tablet by mouth at bedtime.   carbidopa-levodopa 25-100 MG tablet Commonly known as: SINEMET IR Take 1.5 tablets by mouth 3 (three) times daily.   Coenzyme Q10 400 MG Caps Take by mouth.   doxycycline 100 MG capsule Commonly known as: VIBRAMYCIN Take 1 capsule (100 mg total) by mouth 2 (two) times daily for 10 days.  selegiline 5 MG capsule Commonly known as: ELDEPRYL Take 5 mg by mouth 2 (two) times daily.   Vitamin D 50 MCG (2000 UT) tablet Take 2,000 Units by mouth daily.   warfarin 5 MG tablet Commonly known as: COUMADIN Take 2.5-5 tablets by mouth See admin instructions. Take 2.5 mg by mouth in the evening on Tue and Thur.  Take 5 mg every evening on Mon, Wed, Fri, Sat, and Sun       Allergies: Allergies  Allergen Reactions  . Peanut-Containing Drug Products Other (See Comments)    Open sores inside of mouth previously. Stays away from peanuts now    Family  History: Family History  Problem Relation Age of Onset  . Heart disease Mother   . Hypertension Mother   . Stroke Father   . Kidney disease Sister     Social History:   reports that he quit smoking about 51 years ago. He has a 22.00 pack-year smoking history. He has never used smokeless tobacco. He reports that he does not drink alcohol and does not use drugs.  ROS: Pertinent ROS in HPI.  Physical Exam: There were no vitals taken for this visit.  Constitutional:  Alert and oriented, No acute distress. HEENT: [place] AT, moist mucus membranes.  Trachea midline, no masses. Cardiovascular: No clubbing, cyanosis, or edema. Respiratory: Normal respiratory effort, no increased work of breathing. Skin: No rashes, bruises or suspicious lesions. Neurologic: Grossly intact, no focal deficits, moving all 4 extremities. Psychiatric: Normal mood and affect.  Laboratory Data:   Pertinent Imaging: Renal ultrasound images were personally reviewed and interpreted    Assessment & Plan:    1.  Urinary retention  Catheter removed for voiding trial  He has a follow-up appointment this afternoon for bladder scan  Not on an alpha-blocker and would be hesitant based on age and Parkinson's however could consider a prostate-specific alpha-blocker such as silodosin at a 4 mg dose  If no recurrent retention this afternoon recommend a 1 month follow-up for repeat bladder scan and DRE  2.  Asymptomatic bacteriuria  Does not appear he has had symptoms and would not recommend treatment of asymptomatic bacteriuria   Welch Giovanni, MD  [place] [address] [place], [place] [address] [phone]

## 2021-02-15 ENCOUNTER — Encounter: Payer: Self-pay | Admitting: Urology

## 2021-02-15 ENCOUNTER — Other Ambulatory Visit: Payer: Self-pay

## 2021-02-15 ENCOUNTER — Ambulatory Visit: Payer: Medicare Other | Admitting: Urology

## 2021-02-15 ENCOUNTER — Ambulatory Visit (INDEPENDENT_AMBULATORY_CARE_PROVIDER_SITE_OTHER): Payer: Medicare Other | Admitting: Physician Assistant

## 2021-02-15 VITALS — BP 191/78 | HR 73 | Ht 71.0 in | Wt 154.0 lb

## 2021-02-15 DIAGNOSIS — R339 Retention of urine, unspecified: Secondary | ICD-10-CM | POA: Diagnosis not present

## 2021-02-15 DIAGNOSIS — R8271 Bacteriuria: Secondary | ICD-10-CM

## 2021-02-15 LAB — BLADDER SCAN AMB NON-IMAGING

## 2021-02-15 MED ORDER — SILODOSIN 4 MG PO CAPS: 4.0000 mg | ORAL_CAPSULE | Freq: Every day | ORAL | 0 refills | Status: DC

## 2021-02-15 NOTE — Progress Notes (Signed)
Catheter Removal ° °Patient is present today for a catheter removal.  10ml of water was drained from the balloon. A 16FR foley cath was removed from the bladder no complications were noted . Patient tolerated well. ° °Performed by: Lugenia Assefa, CMA ° °Follow up/ Additional notes: PM for PVR  °

## 2021-02-16 ENCOUNTER — Ambulatory Visit: Payer: Medicare Other

## 2021-02-16 DIAGNOSIS — T83011A Breakdown (mechanical) of indwelling urethral catheter, initial encounter: Secondary | ICD-10-CM

## 2021-02-16 NOTE — Progress Notes (Signed)
Afternoon follow-up  Patient returned to clinic this afternoon for repeat PVR. He reports drinking approximately 30oz of fluid. He has been able to urinate. PVR 596mL.  Results for orders placed or performed in visit on 02/15/21  Bladder Scan (Post Void Residual) in office  Result Value Ref Range   Scan Result 525mL     Voiding trial failed. CIC teaching versus Foley replacement and he elected for the latter, see procedure note below. I offered him a trial of low dose silodosin with a low threshold to stop medication with orthostasis and he accepted. Will plan for repeat voiding trial in 2 weeks.  Simple Catheter Placement  Due to urinary retention patient is present today for a foley cath placement.  Patient was cleaned and prepped in a sterile fashion with betadine and 2% lidocaine jelly was instilled into the urethra. A 16 FR coude foley catheter was inserted, urine return was noted  470ml, urine was yellow in color.  The balloon was filled with 10cc of sterile water.  A leg bag was attached for drainage.Patient was given instruction on proper catheter care.  Patient tolerated well, no complications were noted   Performed by: Debroah Loop, PA-C   Additional notes/ Follow up: Return in about 2 weeks (around 03/01/2021) for Voiding trial.

## 2021-02-16 NOTE — Progress Notes (Signed)
Pt presents in clinic and states that leg bag began to leak yesterday and he was unable to stop the leakage with closing the valve. Patient changed to overnight bag in clinic today and given verbal instruction on how to change between leg bag and overnight bag. Patient given leg bag for back up as well.

## 2021-03-01 ENCOUNTER — Ambulatory Visit: Payer: Self-pay | Admitting: Physician Assistant

## 2021-03-01 ENCOUNTER — Inpatient Hospital Stay
Admission: EM | Admit: 2021-03-01 | Discharge: 2021-03-05 | DRG: 698 | Disposition: A | Discharge status: Home Health | Payer: Medicare Other | Attending: Internal Medicine | Admitting: Internal Medicine

## 2021-03-01 ENCOUNTER — Ambulatory Visit: Payer: Medicare Other | Admitting: Physician Assistant

## 2021-03-01 ENCOUNTER — Emergency Department: Payer: Medicare Other

## 2021-03-01 ENCOUNTER — Other Ambulatory Visit: Payer: Self-pay

## 2021-03-01 DIAGNOSIS — I482 Chronic atrial fibrillation, unspecified: Secondary | ICD-10-CM | POA: Diagnosis present

## 2021-03-01 DIAGNOSIS — Z87891 Personal history of nicotine dependence: Secondary | ICD-10-CM | POA: Diagnosis not present

## 2021-03-01 DIAGNOSIS — Y846 Urinary catheterization as the cause of abnormal reaction of the patient, or of later complication, without mention of misadventure at the time of the procedure: Secondary | ICD-10-CM | POA: Diagnosis present

## 2021-03-01 DIAGNOSIS — A4159 Other Gram-negative sepsis: Secondary | ICD-10-CM | POA: Diagnosis present

## 2021-03-01 DIAGNOSIS — Z79899 Other long term (current) drug therapy: Secondary | ICD-10-CM | POA: Diagnosis not present

## 2021-03-01 DIAGNOSIS — Z85828 Personal history of other malignant neoplasm of skin: Secondary | ICD-10-CM

## 2021-03-01 DIAGNOSIS — E785 Hyperlipidemia, unspecified: Secondary | ICD-10-CM | POA: Diagnosis present

## 2021-03-01 DIAGNOSIS — Z952 Presence of prosthetic heart valve: Secondary | ICD-10-CM | POA: Diagnosis not present

## 2021-03-01 DIAGNOSIS — N401 Enlarged prostate with lower urinary tract symptoms: Secondary | ICD-10-CM | POA: Diagnosis present

## 2021-03-01 DIAGNOSIS — A419 Sepsis, unspecified organism: Secondary | ICD-10-CM | POA: Diagnosis present

## 2021-03-01 DIAGNOSIS — G20A1 Parkinson's disease without dyskinesia, without mention of fluctuations: Secondary | ICD-10-CM | POA: Diagnosis present

## 2021-03-01 DIAGNOSIS — E872 Acidosis: Secondary | ICD-10-CM | POA: Diagnosis present

## 2021-03-01 DIAGNOSIS — T83511A Infection and inflammatory reaction due to indwelling urethral catheter, initial encounter: Principal | ICD-10-CM | POA: Diagnosis present

## 2021-03-01 DIAGNOSIS — R188 Other ascites: Secondary | ICD-10-CM

## 2021-03-01 DIAGNOSIS — R7989 Other specified abnormal findings of blood chemistry: Secondary | ICD-10-CM

## 2021-03-01 DIAGNOSIS — N390 Urinary tract infection, site not specified: Secondary | ICD-10-CM | POA: Diagnosis present

## 2021-03-01 DIAGNOSIS — Z20822 Contact with and (suspected) exposure to covid-19: Secondary | ICD-10-CM | POA: Diagnosis present

## 2021-03-01 DIAGNOSIS — E538 Deficiency of other specified B group vitamins: Secondary | ICD-10-CM | POA: Diagnosis present

## 2021-03-01 DIAGNOSIS — G20 Parkinson's disease: Secondary | ICD-10-CM | POA: Diagnosis present

## 2021-03-01 DIAGNOSIS — Z7901 Long term (current) use of anticoagulants: Secondary | ICD-10-CM

## 2021-03-01 DIAGNOSIS — I10 Essential (primary) hypertension: Secondary | ICD-10-CM | POA: Diagnosis present

## 2021-03-01 DIAGNOSIS — N179 Acute kidney failure, unspecified: Secondary | ICD-10-CM | POA: Diagnosis present

## 2021-03-01 DIAGNOSIS — R339 Retention of urine, unspecified: Secondary | ICD-10-CM | POA: Diagnosis not present

## 2021-03-01 DIAGNOSIS — Z951 Presence of aortocoronary bypass graft: Secondary | ICD-10-CM

## 2021-03-01 DIAGNOSIS — R338 Other retention of urine: Secondary | ICD-10-CM | POA: Diagnosis present

## 2021-03-01 LAB — URINALYSIS, COMPLETE (UACMP) WITH MICROSCOPIC
Bilirubin Urine: NEGATIVE
Glucose, UA: NEGATIVE mg/dL
Ketones, ur: 5 mg/dL — AB
Nitrite: NEGATIVE
Protein, ur: 100 mg/dL — AB
RBC / HPF: >50 RBC/hpf — ABNORMAL HIGH (ref 0–5)
Specific Gravity, Urine: 1.013 (ref 1.005–1.030)
Squamous Epithelial / HPF: NONE SEEN (ref 0–5)
WBC, UA: >50 WBC/hpf — ABNORMAL HIGH (ref 0–5)
pH: 5 (ref 5.0–8.0)

## 2021-03-01 LAB — COMPREHENSIVE METABOLIC PANEL
ALT: <5 U/L (ref 0–44)
AST: 14 U/L — ABNORMAL LOW (ref 15–41)
Albumin: 3.2 g/dL — ABNORMAL LOW (ref 3.5–5.0)
Alkaline Phosphatase: 143 U/L — ABNORMAL HIGH (ref 38–126)
Anion gap: 9 (ref 5–15)
BUN: 36 mg/dL — ABNORMAL HIGH (ref 8–23)
CO2: 23 mmol/L (ref 22–32)
Calcium: 8.7 mg/dL — ABNORMAL LOW (ref 8.9–10.3)
Chloride: 103 mmol/L (ref 98–111)
Creatinine, Ser: 1.52 mg/dL — ABNORMAL HIGH (ref 0.61–1.24)
GFR, Estimated: 45 mL/min — ABNORMAL LOW (ref >60)
Glucose, Bld: 159 mg/dL — ABNORMAL HIGH (ref 70–99)
Potassium: 4.4 mmol/L (ref 3.5–5.1)
Sodium: 135 mmol/L (ref 135–145)
Total Bilirubin: 1.1 mg/dL (ref 0.3–1.2)
Total Protein: 7 g/dL (ref 6.5–8.1)

## 2021-03-01 LAB — LACTIC ACID, PLASMA
Lactic Acid, Venous: 2.4 mmol/L (ref 0.5–1.9)
Lactic Acid, Venous: 1.6 mmol/L (ref 0.5–1.9)

## 2021-03-01 LAB — CBC WITH DIFFERENTIAL/PLATELET
Abs Immature Granulocytes: 0.07 10*3/uL (ref 0.00–0.07)
Basophils Absolute: 0 10*3/uL (ref 0.0–0.1)
Basophils Relative: 1 %
Eosinophils Absolute: 0 10*3/uL (ref 0.0–0.5)
Eosinophils Relative: 0 %
HCT: 41 % (ref 39.0–52.0)
Hemoglobin: 13 g/dL (ref 13.0–17.0)
Immature Granulocytes: 1 %
Lymphocytes Relative: 3 %
Lymphs Abs: 0.2 10*3/uL — ABNORMAL LOW (ref 0.7–4.0)
MCH: 28.2 pg (ref 26.0–34.0)
MCHC: 31.7 g/dL (ref 30.0–36.0)
MCV: 88.9 fL (ref 80.0–100.0)
Monocytes Absolute: 0 10*3/uL — ABNORMAL LOW (ref 0.1–1.0)
Monocytes Relative: 1 %
Neutro Abs: 5.8 10*3/uL (ref 1.7–7.7)
Neutrophils Relative %: 94 %
Platelets: 216 10*3/uL (ref 150–400)
RBC: 4.61 MIL/uL (ref 4.22–5.81)
RDW: 14 % (ref 11.5–15.5)
Smear Review: NORMAL
WBC: 6.1 10*3/uL (ref 4.0–10.5)
nRBC: 0 % (ref 0.0–0.2)

## 2021-03-01 LAB — BLADDER SCAN AMB NON-IMAGING

## 2021-03-01 MED ORDER — LACTATED RINGERS IV BOLUS
1000.0000 mL | Freq: Once | INTRAVENOUS | Status: AC
  Administered 2021-03-01: 1000 mL via INTRAVENOUS

## 2021-03-01 MED ORDER — ACETAMINOPHEN 650 MG RE SUPP
650.0000 mg | Freq: Once | RECTAL | Status: AC
  Administered 2021-03-01: 650 mg via RECTAL
  Filled 2021-03-01: qty 1

## 2021-03-01 MED ORDER — VANCOMYCIN HCL IN DEXTROSE 1-5 GM/200ML-% IV SOLN
1000.0000 mg | Freq: Once | INTRAVENOUS | Status: AC
  Administered 2021-03-01: 1000 mg via INTRAVENOUS
  Filled 2021-03-01: qty 200

## 2021-03-01 MED ORDER — SODIUM CHLORIDE 0.9 % IV SOLN
1.0000 g | Freq: Once | INTRAVENOUS | Status: AC
  Administered 2021-03-01: 1 g via INTRAVENOUS
  Filled 2021-03-01: qty 10

## 2021-03-01 NOTE — ED Notes (Signed)
Per lab tech, blood cultures have been received in lab

## 2021-03-01 NOTE — ED Provider Notes (Signed)
[facility] Emergency Department Provider Note   ____________________________________________   I have reviewed the triage vital signs and the nursing notes.   HISTORY  Chief Complaint Altered Mental Status   History limited by: Not Limited   HPI Jonathon Welch is a 85 y.o. male who presents to the emergency department today because of concerns for fevers and some confusion.  History is primarily obtained through wife.  She states that the patient was seen at urologist office earlier today to have indwelling Foley catheter removed.  Patient did have that placed secondary to UTI and urinary retention.  Since coming home he has been able to urinate.  It was not until tonight when she noticed that he started having some confusion.  He then complained of being very cold and she noted that he had a fever.  She does think that his breathing is a little bit heavier than it normally has been although she has not appreciated any cough today.  Patient has not been complaining of any chest pain or abdominal pain.  Records reviewed. Per medical record review patient has a history of HLD, HTN, atrial fibrillation.   Past Medical History:  Diagnosis Date  . Anemia    vitamin b12 deficiency  . Aortic valve disease   . Atrial fibrillation ([place])   . Cancer ([place])    basal and squamous cell skin cancer  . DDD (degenerative disc disease), lumbar 07/25/2016  . Depression   . Dysrhythmia    atrial fibrillation  . Herniation through surgical site   . Hyperlipidemia   . Hypertension   . Left inguinal hernia 2019  . Migraine headache    less frequent lately  . Parkinson disease ([place])   . Polycystic kidney disease   . Right inguinal hernia   . Tremor   . Ventral hernia 2019    Patient Active Problem List   Diagnosis Date Noted  . Non-recurrent inguinal hernia without obstruction or gangrene   . Ventral hernia without obstruction or gangrene   . B12 deficiency  07/25/2016  . DDD (degenerative disc disease), lumbar 07/25/2016  . Chronic atrial fibrillation ([place]) 01/14/2016  . Combined fat and carbohydrate induced hyperlipemia 01/14/2015  . H/O aortic valve replacement 04/10/2014  . 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 Aortic valve defect 03/21/2012  . Hypertension 03/21/2012  . Benign essential HTN 04/14/2008  . Benign prostatic hypertrophy without urinary obstruction 04/13/2008  . Parkinson's disease ([place]) 04/13/2008    Past Surgical History:  Procedure Laterality Date  . AORTIC AND MITRAL VALVE REPLACEMENT N/A   . APPENDECTOMY    . ASCENDING AORTIC ANEURYSM REPAIR W/ MECHANICAL AORTIC VALVE REPLACEMENT  1985   aneurysm repaired when replaced "blown out" tissue valve  . CARDIAC VALVE REPLACEMENT  1980 and 1985   aortic valve replaced, mechanical valve  . CORONARY ARTERY BYPASS GRAFT  1980 and 1985   aortic valve replaced not CABG  . MOHS SURGERY     nose and ear  . ROBOTIC ASSISTED LAPAROSCOPIC VENTRAL/INCISIONAL HERNIA REPAIR Right 04/06/2018   Procedure: ROBOTIC ASSISTED LAPAROSCOPIC VENTRAL/INCISIONAL HERNIA REPAIR AND RIGHT INGUINAL HERNIA;  Surgeon: Jules Husbands, MD;  Location: [facility] ORS;  Service: General;  Laterality: Right;    Prior to Admission medications   Medication Sig Start Date End Date Taking? Authorizing Provider  acetaminophen (TYLENOL) 500 MG tablet Take 500 mg by mouth daily as needed for moderate pain or headache.    [provider]  amLODipine (NORVASC) 2.5  MG tablet Take by mouth. 08/24/20   [provider]  carbidopa-levodopa (SINEMET CR) 50-200 MG tablet Take 1 tablet by mouth at bedtime.    [provider]  carbidopa-levodopa (SINEMET IR) 25-100 MG tablet Take 1.5 tablets by mouth 3 (three) times daily.  01/12/16   [provider]  Cholecalciferol (VITAMIN D) 2000 units tablet Take 2,000 Units by mouth daily.    [provider]  Coenzyme Q10 400 MG CAPS Take by mouth.  05/27/10   [provider]  selegiline (ELDEPRYL) 5 MG capsule Take 5 mg by mouth 2 (two) times daily.  06/07/16   [provider]  silodosin (RAPAFLO) 4 MG CAPS capsule Take 1 capsule (4 mg total) by mouth daily with breakfast. 02/15/21   Debroah Loop, PA-C  warfarin (COUMADIN) 5 MG tablet Take 2.5-5 tablets by mouth See admin instructions. Take 2.5 mg by mouth in the evening on Tue and Thur.  Take 5 mg every evening on Mon, Wed, Fri, Sat, and Sun    [provider]    Allergies Peanut-containing drug products  Family History  Problem Relation Age of Onset  . Heart disease Mother   . Hypertension Mother   . Stroke Father   . Kidney disease Sister     Social History Social History   Tobacco Use  . Smoking status: Former Smoker    Packs/day: 2.00    Years: 11.00    Pack years: 22.00    Quit date: 1971    Years since quitting: 51.4  . Smokeless tobacco: Never Used  Vaping Use  . Vaping Use: Never used  Substance Use Topics  . Alcohol use: No    Comment: occasional beer  . Drug use: No    Review of Systems Constitutional: Positive for fever.  Eyes: No visual changes. ENT: No sore throat. Cardiovascular: Denies chest pain. Respiratory: Positive for shortness of breath. Gastrointestinal: No abdominal pain.  No nausea, no vomiting.  No diarrhea.   Genitourinary: Negative for dysuria. Musculoskeletal: Negative for back pain. Skin: Negative for rash. Neurological: Negative for headaches, focal weakness or numbness.  ____________________________________________   PHYSICAL EXAM:  VITAL SIGNS: ED Triage Vitals  Enc Vitals Group     BP 03/01/21 2110 (!) 154/83     Pulse Rate 03/01/21 2110 97     Resp 03/01/21 2110 (!) 26     Temp 03/01/21 2110 (!) 102.6 F (39.2 C)     Temp Source 03/01/21 2110 Oral     SpO2 03/01/21 2110 92 %     Weight 03/01/21 2111 155 lb (70.3 kg)     Height 03/01/21 2111 5\' 5"  (1.651 m)     Head Circumference  --      Peak Flow --      Pain Score 03/01/21 2110 0   Constitutional: Awake and alert. Not completely oriented.  Eyes: Conjunctivae are normal.  ENT      Head: Normocephalic and atraumatic.      Nose: No congestion/rhinnorhea.      Mouth/Throat: Mucous membranes are moist.      Neck: No stridor. Hematological/Lymphatic/Immunilogical: No cervical lymphadenopathy. Cardiovascular: Normal rate, regular rhythm.  No murmurs, rubs, or gallops.  Respiratory: Increased respiratory effort.  Gastrointestinal: Soft and non tender. No rebound. No guarding.  Genitourinary: Deferred Musculoskeletal: Normal range of motion in all extremities. No lower extremity edema. Neurologic:  Normal speech and language. No gross focal neurologic deficits are appreciated.  Skin:  Skin is warm, dry  and intact. No rash noted. Psychiatric: Mood and affect are normal. Speech and behavior are normal. Patient exhibits appropriate insight and judgment.  ____________________________________________    LABS (pertinent positives/negatives)  UA hazy, moderate hgb dipstick, ketones 5, protein 100, large leukocytes, >50 rbc and wbc CBC wbc 6.1, hgb 13.0, plt 216 Lactic acid 2.4 CMP na 135, k 4.4, cr 1.52  ____________________________________________   EKG  I, Nance Pear, attending physician, personally viewed and interpreted this EKG  EKG Time: 2113 Rate: 100 Rhythm: sinus tachycardia Axis: left axis deviation Intervals: qtc 420 QRS: LVH ST changes: no st elevation Impression: abnormal ekg  ____________________________________________    RADIOLOGY  CXR Chronic interstitial lung markings  ____________________________________________   PROCEDURES  Procedures  CRITICAL CARE Performed by: Nance Pear   Total critical care time: 30 minutes  Critical care time was exclusive of separately billable procedures and treating other patients.  Critical care was necessary to treat or prevent  imminent or life-threatening deterioration.  Critical care was time spent personally by me on the following activities: development of treatment plan with patient and/or surrogate as well as nursing, discussions with consultants, evaluation of patient's response to treatment, examination of patient, obtaining history from patient or surrogate, ordering and performing treatments and interventions, ordering and review of laboratory studies, ordering and review of radiographic studies, pulse oximetry and re-evaluation of patient's condition.  ____________________________________________   INITIAL IMPRESSION / ASSESSMENT AND PLAN / ED COURSE  Pertinent labs & imaging results that were available during my care of the patient were reviewed by me and considered in my medical decision making (see chart for details).   Patient presented to the emergency department today because of concerns for some confusion and fever.  Patient was febrile here and initial vital signs.  Work-up is concerning for sepsis with elevated lactic acidosis.  The patient's urine is concerning for infection.  Chest x-ray without any acute abnormalities.  Patient was started on broad-spectrum antibiotics and IV fluids.  Will plan on admission to the hospitalist service.   ____________________________________________   FINAL CLINICAL IMPRESSION(S) / ED DIAGNOSES  Final diagnoses:  Lower urinary tract infection  Elevated lactic acid level     Note: This dictation was prepared with Dragon dictation. Any transcriptional errors that result from this process are unintentional     Nance Pear, MD 03/01/21 2336

## 2021-03-01 NOTE — ED Notes (Signed)
Pt states he has to pee. Assisted pt with urinal then states he cannot pee. This RN pressed on pt's lower abdomen and he groaned in pain. Pt's abd very distended but wife states this is normal. Purulent drainage noted from penis as well. Bladder scanned pt and ~634mL urine noted in bladder. Archie Balboa MD made aware of this and states to place foley instead of in/out catheter

## 2021-03-01 NOTE — Progress Notes (Signed)
In and Out Catheterization  Patient is present today for a I & O catheterization with CIC teaching due to elevated bladder scan. Patient was cleaned and prepped in a clean fashion with castile soap wipes. A 14FR coude [employer] Standard was inserted no complications were noted , 110ml of urine return was noted, urine was cloudy yellow in color. A clean urine sample was collected for UA and culture. Bladder was drained and catheter was removed with out difficulty.    Performed by: Debroah Loop, PA-C

## 2021-03-01 NOTE — ED Notes (Signed)
Pt presenting via EMS, pt's wife at bedside who provides pt's medical history. Pt's wife reports pt had foley catheter placed last week "to find the source of infection." She states it was removed today and pt was fine until later on tonight he became altered. She states she checked his temperature at home and it was normal. Pt has hx of parkinson's, is on blood thinners. Placed on 2L [place] on arrival to room for SpO2 92%. Pt alert to self, place.

## 2021-03-01 NOTE — ED Notes (Signed)
1 set blood cultures collected and sent to lab with temporary labels

## 2021-03-01 NOTE — Progress Notes (Signed)
03/01/2021 5:28 PM   Jonathon Welch 01/11/1936 [id number]  CC: Chief Complaint  Patient presents with  . Urinary Retention    HPI: Jonathon Welch is a 85 y.o. male with PMH polycystic kidney disease, ventral hernia, asymptomatic bacteriuria, Parkinson's disease, and a recent history of urinary retention who presents today for second voiding trial on silodosin 4mg . He is accompanied today by his son, who contributes to HPI.  Today he reports discomfort associated with his catheter that has interrupted his sleep. He has BLE weakness at baseline and fell this morning while in the bathroom. He denies hitting his head or LOC. Unclear if he's been having dizziness or lightheadedness with standing, son feels he may be weaker than usual lately due to fatigue. He has also noted some blood-tinged mucus threads in his urine, and brings one with him to clinic today.  Foley catheter removed in the morning, see separate procedure note for details. Patient returned to clinic in the afternoon for PVR. He has been able to urinate, reports a strong stream consistent with baseline, and denies abdominal pain. Bladder scan with 622mL.  PMH: Past Medical History:  Diagnosis Date  . Anemia    vitamin b12 deficiency  . Aortic valve disease   . Atrial fibrillation ([place])   . Cancer ([place])    basal and squamous cell skin cancer  . DDD (degenerative disc disease), lumbar 07/25/2016  . Depression   . Dysrhythmia    atrial fibrillation  . Herniation through surgical site   . Hyperlipidemia   . Hypertension   . Left inguinal hernia 2019  . Migraine headache    less frequent lately  . Parkinson disease ([place])   . Polycystic kidney disease   . Right inguinal hernia   . Tremor   . Ventral hernia 2019    Surgical History: Past Surgical History:  Procedure Laterality Date  . AORTIC AND MITRAL VALVE REPLACEMENT N/A   . APPENDECTOMY    . ASCENDING AORTIC ANEURYSM REPAIR W/ MECHANICAL AORTIC VALVE  REPLACEMENT  1985   aneurysm repaired when replaced "blown out" tissue valve  . CARDIAC VALVE REPLACEMENT  1980 and 1985   aortic valve replaced, mechanical valve  . CORONARY ARTERY BYPASS GRAFT  1980 and 1985   aortic valve replaced not CABG  . MOHS SURGERY     nose and ear  . ROBOTIC ASSISTED LAPAROSCOPIC VENTRAL/INCISIONAL HERNIA REPAIR Right 04/06/2018   Procedure: ROBOTIC ASSISTED LAPAROSCOPIC VENTRAL/INCISIONAL HERNIA REPAIR AND RIGHT INGUINAL HERNIA;  Surgeon: Jules Husbands, MD;  Location: [facility] ORS;  Service: General;  Laterality: Right;    Home Medications:  Allergies as of 03/01/2021      Reactions   Peanut-containing Drug Products Other (See Comments)   Open sores inside of mouth previously. Stays away from peanuts now      Medication List       Accurate as of Mar 01, 2021  5:28 PM. If you have any questions, ask your nurse or doctor.        acetaminophen 500 MG tablet Commonly known as: TYLENOL Take 500 mg by mouth daily as needed for moderate pain or headache.   amLODipine 2.5 MG tablet Commonly known as: NORVASC Take by mouth.   carbidopa-levodopa 50-200 MG tablet Commonly known as: SINEMET CR Take 1 tablet by mouth at bedtime.   carbidopa-levodopa 25-100 MG tablet Commonly known as: SINEMET IR Take 1.5 tablets by mouth 3 (three) times daily.   Coenzyme Q10 400 MG  Caps Take by mouth.   selegiline 5 MG capsule Commonly known as: ELDEPRYL Take 5 mg by mouth 2 (two) times daily.   silodosin 4 MG Caps capsule Commonly known as: RAPAFLO Take 1 capsule (4 mg total) by mouth daily with breakfast.   Vitamin D 50 MCG (2000 UT) tablet Take 2,000 Units by mouth daily.   warfarin 5 MG tablet Commonly known as: COUMADIN Take 2.5-5 tablets by mouth See admin instructions. Take 2.5 mg by mouth in the evening on Tue and Thur.  Take 5 mg every evening on Mon, Wed, Fri, Sat, and Sun       Allergies:  Allergies  Allergen Reactions  . Peanut-Containing Drug  Products Other (See Comments)    Open sores inside of mouth previously. Stays away from peanuts now    Family History: Family History  Problem Relation Age of Onset  . Heart disease Mother   . Hypertension Mother   . Stroke Father   . Kidney disease Sister     Social History:   reports that he quit smoking about 51 years ago. He has a 22.00 pack-year smoking history. He has never used smokeless tobacco. He reports that he does not drink alcohol and does not use drugs.  Physical Exam: There were no vitals taken for this visit.  Constitutional:  Alert and oriented, no acute distress, nontoxic appearing HEENT: [place], AT Cardiovascular: No clubbing, cyanosis, or edema Respiratory: Normal respiratory effort, no increased work of breathing Skin: No rashes, bruises or suspicious lesions Neurologic: Shuffling gait Psychiatric: Normal mood and affect  Laboratory Data: Results for orders placed or performed in visit on 03/01/21  BLADDER SCAN AMB NON-IMAGING  Result Value Ref Range   Scan Result 648mL    Assessment & Plan:   1. Urinary retention Voiding trial apparently failed per bladder scan, however he remained asymptomatic and was hesitant to pursue re-catheterization. I offered the patient CIC teaching versus Foley replacement versus watchful waiting and he elected for the latter, but he did agree to CIC teaching in case he developed abdominal pain, see separate procedure note for details.  On I&O catheterization, residual was found to only be 196mL of cloudy urine consistent with his known history of asymptomatic bacteriuria. I explained that I suspect his ventral hernia or an abdominal fluid collection of other etiology altered his bladder scan results and explained that he is not truly back in urinary retention. Given his recent history of symptomatic urinary retention with residual >556mL, I instructed the patient's son in how to catheterize him, provided written instructions, and gave  them catheter samples to take home in case of recurrence.  Voiding trial passed. Counseled patient to continue silodosin and follow up with Dr. Bernardo Heater next month for symptom recheck and DRE as previously scheduled. Patient remains asymptomatic of infection today, will send for culture in case he develops infective symptoms. - BLADDER SCAN AMB NON-IMAGING - Urinalysis, Complete - CULTURE, URINE COMPREHENSIVE  2. Abdominal fluid collection Possibly associated with his known ventral hernia as above, however I recommended CT AP without contrast for further evaluation to rule out other abdominal fluid collections. Patient is in agreement with this plan. - CT RENAL STONE STUDY; Future   Return if symptoms worsen or fail to improve.   I spent 50 minutes on the day of the encounter to include pre-visit record review, face-to-face time with the patient, and post-visit ordering of tests.   Debroah Loop, PA-C  [facility] [address]  [address], [place], [place] [address] [phone]

## 2021-03-01 NOTE — ED Triage Notes (Signed)
Pt to ED via EMS from home, per ems pt has had urinary retention for the past 3 weeks and has had a catheter, pts urinary catheter was removed today. Per ems pts wife called out today for fever and ams at home. Pt denies pain. Per ems pt has been able to urinate at home post catheter removal. Pts abd distension is normal per wife, hx hernia.

## 2021-03-01 NOTE — Progress Notes (Signed)
Catheter Removal  Patient is present today for a catheter removal.  9ml of water was drained from the balloon. A 16FR coude foley cath was removed from the bladder no complications were noted . Patient tolerated well.  Performed by: Kariah Loredo, PA-C   Follow up/ Additional notes: Push fluids and RTC this afternoon for PVR. 

## 2021-03-01 NOTE — Patient Instructions (Signed)

## 2021-03-02 DIAGNOSIS — N390 Urinary tract infection, site not specified: Secondary | ICD-10-CM

## 2021-03-02 DIAGNOSIS — Z7901 Long term (current) use of anticoagulants: Secondary | ICD-10-CM

## 2021-03-02 DIAGNOSIS — A419 Sepsis, unspecified organism: Secondary | ICD-10-CM

## 2021-03-02 LAB — RESP PANEL BY RT-PCR (FLU A&B, COVID) ARPGX2
Influenza A by PCR: NEGATIVE
Influenza B by PCR: NEGATIVE
SARS Coronavirus 2 by RT PCR: NEGATIVE

## 2021-03-02 LAB — BLOOD CULTURE ID PANEL (REFLEXED) - BCID2

## 2021-03-02 LAB — CORTISOL-AM, BLOOD: Cortisol - AM: 53.5 ug/dL — ABNORMAL HIGH (ref 6.7–22.6)

## 2021-03-02 LAB — PROTIME-INR
INR: 1.5 — ABNORMAL HIGH (ref 0.8–1.2)
Prothrombin Time: 18.2 seconds — ABNORMAL HIGH (ref 11.4–15.2)

## 2021-03-02 LAB — PROCALCITONIN: Procalcitonin: 104.12 ng/mL

## 2021-03-02 MED ORDER — APIXABAN 2.5 MG PO TABS
2.5000 mg | ORAL_TABLET | Freq: Two times a day (BID) | ORAL | Status: DC
  Administered 2021-03-02 – 2021-03-05 (×7): 2.5 mg via ORAL
  Filled 2021-03-02 (×7): qty 1

## 2021-03-02 MED ORDER — LACTATED RINGERS IV SOLN: INTRAVENOUS | Status: DC

## 2021-03-02 MED ORDER — SELEGILINE HCL 5 MG PO TABS
5.0000 mg | ORAL_TABLET | Freq: Two times a day (BID) | ORAL | Status: DC
  Administered 2021-03-02 – 2021-03-05 (×7): 5 mg via ORAL
  Filled 2021-03-02 (×9): qty 1

## 2021-03-02 MED ORDER — SODIUM CHLORIDE 0.9 % IV SOLN
2.0000 g | INTRAVENOUS | Status: DC
  Administered 2021-03-03 – 2021-03-05 (×3): 2 g via INTRAVENOUS
  Filled 2021-03-02 (×5): qty 20

## 2021-03-02 MED ORDER — SODIUM CHLORIDE 0.9 % IV SOLN
2.0000 g | Freq: Two times a day (BID) | INTRAVENOUS | Status: DC
  Administered 2021-03-02: 2 g via INTRAVENOUS
  Filled 2021-03-02 (×2): qty 2

## 2021-03-02 MED ORDER — ONDANSETRON HCL 4 MG PO TABS: 4.0000 mg | ORAL_TABLET | Freq: Four times a day (QID) | ORAL | Status: DC | PRN

## 2021-03-02 MED ORDER — TAMSULOSIN HCL 0.4 MG PO CAPS
0.4000 mg | ORAL_CAPSULE | Freq: Every day | ORAL | Status: DC
  Administered 2021-03-02 – 2021-03-05 (×4): 0.4 mg via ORAL
  Filled 2021-03-02 (×4): qty 1

## 2021-03-02 MED ORDER — ONDANSETRON HCL 4 MG/2ML IJ SOLN
4.0000 mg | Freq: Four times a day (QID) | INTRAMUSCULAR | Status: DC | PRN
  Administered 2021-03-02: 4 mg via INTRAVENOUS
  Filled 2021-03-02: qty 2

## 2021-03-02 MED ORDER — HYDROCODONE-ACETAMINOPHEN 5-325 MG PO TABS
1.0000 | ORAL_TABLET | ORAL | Status: DC | PRN
  Filled 2021-03-02: qty 1

## 2021-03-02 MED ORDER — ACETAMINOPHEN 650 MG RE SUPP: 650.0000 mg | Freq: Four times a day (QID) | RECTAL | Status: DC | PRN

## 2021-03-02 MED ORDER — CARBIDOPA-LEVODOPA 25-100 MG PO TABS
2.0000 | ORAL_TABLET | Freq: Four times a day (QID) | ORAL | Status: DC
  Administered 2021-03-02 – 2021-03-05 (×15): 2 via ORAL
  Filled 2021-03-02 (×15): qty 2

## 2021-03-02 MED ORDER — CARBIDOPA-LEVODOPA ER 50-200 MG PO TBCR: 1.0000 | EXTENDED_RELEASE_TABLET | Freq: Every day | ORAL | Status: DC

## 2021-03-02 MED ORDER — SODIUM CHLORIDE 0.9 % IV SOLN: 2.0000 g | Freq: Once | INTRAVENOUS | Status: DC

## 2021-03-02 MED ORDER — CHLORHEXIDINE GLUCONATE CLOTH 2 % EX PADS
6.0000 | MEDICATED_PAD | Freq: Every day | CUTANEOUS | Status: DC
  Administered 2021-03-03 – 2021-03-05 (×3): 6 via TOPICAL

## 2021-03-02 MED ORDER — ACETAMINOPHEN 325 MG PO TABS
650.0000 mg | ORAL_TABLET | Freq: Four times a day (QID) | ORAL | Status: DC | PRN
  Administered 2021-03-02 – 2021-03-04 (×5): 650 mg via ORAL
  Filled 2021-03-02 (×5): qty 2

## 2021-03-02 NOTE — Progress Notes (Signed)
Pharmacy Antibiotic Note  Jonathon Welch is a 85 y.o. male admitted on 03/01/2021 with UTI.  Pharmacy has been consulted for Cefepime dosing.  Plan: Ordered Cefepime 2 gm q12h per indication and renal fxn.  Pharmacy will continue to follow and will adjust abx dosing if warranted.  Height: 5\' 5"  (165.1 cm) Weight: 70.3 kg (155 lb) IBW/kg (Calculated) : 61.5  Temp (24hrs), Avg:101.5 F (38.6 C), Min:99.8 F (37.7 C), Max:102.6 F (39.2 C)  Recent Labs  Lab 03/01/21 2113 03/01/21 2320  WBC 6.1  --   CREATININE 1.52*  --   LATICACIDVEN 2.4* 1.6    Estimated Creatinine Clearance: 32.6 mL/min (A) (by C-G formula based on SCr of 1.52 mg/dL (H)).    Allergies  Allergen Reactions  . Peanut-Containing Drug Products Other (See Comments)    Open sores inside of mouth previously. Stays away from peanuts now    Antimicrobials this admission: 05/16 Vancomycin >> x 1 05/16 Ceftriaxone >> x 1 05/17 Cefepime >>  Microbiology results: 05/16 BCx: Pending 05/16 UCx: Pending   Thank you for allowing pharmacy to be a part of this patient's care.  Renda Rolls, PharmD, [facility] 03/02/2021 3:51 AM

## 2021-03-02 NOTE — Progress Notes (Addendum)
Jonathon Welch is a 85 year old male with history of chronic A. fib and history of aortic valve replacement on anticoagulation, Parkinson's disease, HTN, CKD lllb and BPH who recently had an indwelling Foley catheter for 3 weeks, removed on 5/16 presenting with fever chills and lethargy.  Pt presented with sepsis presumed due to urinary source, received vanc/cefe/ceftriaxone on presentation.  Blood cx positive for GNR, and based on recent urine cx on 4/24 growing CITROBACTER KOSERI, ID pharm believed identify of GNR is likely CITROBACTER KOSERI, so abx narrowed to ceftriaxone.  Pt did have urinary retention in the ED, so Foley reinserted.    Confirmed with pt and son that his aortic valve is [employer] Jude mechanical, and pt has been on warfarin for many years, but was recently switched to Eliquis to minimize interaction of warfarin with his other meds and abx.  Pt seen and examined.  Son updated at bedside today.  No charge note.

## 2021-03-02 NOTE — H&P (Signed)
History and Physical    TUFF CLABO DTO:[id number] DOB: 07-Dec-1935 DOA: 03/01/2021  PCP: Rusty Aus, MD   Patient coming from: Home  I have personally briefly reviewed patient's old medical records in [place]  Chief Complaint: Chills, lethargy  HPI: Jonathon Welch is a 85 y.o. male with medical history significant for Chronic A. fib and history of aortic valve replacement on anticoagulation, Parkinson's disease, HTN,CKD lllb and BPH who recently had an indwelling Foley catheter for 3 weeks, removed today at urology office who was in his usual state of health until 3 days prior when he developed pain at the site of the Foley catheter.  They saw the urologist at the onset of the pain and it was thought that it might be discomfort from the Foley.  He returned to the urologist on 5/16 at which time the Foley was removed.  Later on that night he developed shaking chills, generalized weakness to where he could not get up on his own and appeared to be confused more than for his baseline of Parkinson's.  By arrival in the emergency room he had a fever ED course: On arrival temperature 102.6, pulse 97, respirations 26 with O2 sat 92% on room air and blood pressure 154/83.  Urinalysis with large leukocyte esterases and rare bacteria, CBC with WBC 6.1, hemoglobin 13, lactic acid 2.4 CMP with creatinine of 1.52 which is about his baseline EKG, personally reviewed and interpreted: Sinus tachycardia at 100 with no acute ST-T wave changes Imaging: Chronic interstitial lung markings  Patient started on IV sepsis fluids, Rocephin and vancomycin for sepsis secondary to UTI.  Hospitalist consulted for admission.  Review of Systems: Unable to obtain due to patient's lethargy   Past Medical History:  Diagnosis Date  . Anemia    vitamin b12 deficiency  . Aortic valve disease   . Atrial fibrillation ([place])   . Cancer ([place])    basal and squamous cell skin cancer  . DDD (degenerative disc  disease), lumbar 07/25/2016  . Depression   . Dysrhythmia    atrial fibrillation  . Herniation through surgical site   . Hyperlipidemia   . Hypertension   . Left inguinal hernia 2019  . Migraine headache    less frequent lately  . Parkinson disease ([place])   . Polycystic kidney disease   . Right inguinal hernia   . Tremor   . Ventral hernia 2019    Past Surgical History:  Procedure Laterality Date  . AORTIC AND MITRAL VALVE REPLACEMENT N/A   . APPENDECTOMY    . ASCENDING AORTIC ANEURYSM REPAIR W/ MECHANICAL AORTIC VALVE REPLACEMENT  1985   aneurysm repaired when replaced "blown out" tissue valve  . CARDIAC VALVE REPLACEMENT  1980 and 1985   aortic valve replaced, mechanical valve  . CORONARY ARTERY BYPASS GRAFT  1980 and 1985   aortic valve replaced not CABG  . MOHS SURGERY     nose and ear  . ROBOTIC ASSISTED LAPAROSCOPIC VENTRAL/INCISIONAL HERNIA REPAIR Right 04/06/2018   Procedure: ROBOTIC ASSISTED LAPAROSCOPIC VENTRAL/INCISIONAL HERNIA REPAIR AND RIGHT INGUINAL HERNIA;  Surgeon: Jules Husbands, MD;  Location: [facility] ORS;  Service: General;  Laterality: Right;     reports that he quit smoking about 51 years ago. He has a 22.00 pack-year smoking history. He has never used smokeless tobacco. He reports that he does not drink alcohol and does not use drugs.  Allergies  Allergen Reactions  . Peanut-Containing Drug Products Other (See Comments)  Open sores inside of mouth previously. Stays away from peanuts now    Family History  Problem Relation Age of Onset  . Heart disease Mother   . Hypertension Mother   . Stroke Father   . Kidney disease Sister       Prior to Admission medications   Medication Sig Start Date End Date Taking? Authorizing Provider  apixaban (ELIQUIS) 2.5 MG TABS tablet Take 2.5 mg by mouth 2 (two) times daily.   Yes [provider]  ferrous gluconate (FERGON) 324 MG tablet Take 324 mg by mouth daily with breakfast.   Yes [provider]  furosemide (LASIX) 20 MG tablet Take 40 mg by mouth daily.   Yes [provider]  metolazone (ZAROXOLYN) 2.5 MG tablet Take 2.5 mg by mouth as needed.   Yes [provider]  mirtazapine (REMERON) 7.5 MG tablet Take 7.5 mg by mouth at bedtime.   Yes [provider]  acetaminophen (TYLENOL) 500 MG tablet Take 500 mg by mouth daily as needed for moderate pain or headache.    [provider]  amLODipine (NORVASC) 2.5 MG tablet Take by mouth. 08/24/20   [provider]  carbidopa-levodopa (SINEMET CR) 50-200 MG tablet Take 1 tablet by mouth at bedtime.    [provider]  carbidopa-levodopa (SINEMET IR) 25-100 MG tablet Take 1.5 tablets by mouth 3 (three) times daily.  01/12/16   [provider]  Cholecalciferol (VITAMIN D) 2000 units tablet Take 2,000 Units by mouth daily.    [provider]  Coenzyme Q10 400 MG CAPS Take by mouth. 05/27/10   [provider]  selegiline (ELDEPRYL) 5 MG capsule Take 5 mg by mouth 2 (two) times daily.  06/07/16   [provider]  silodosin (RAPAFLO) 4 MG CAPS capsule Take 1 capsule (4 mg total) by mouth daily with breakfast. 02/15/21   Debroah Loop, PA-C  warfarin (COUMADIN) 5 MG tablet Take 2.5-5 tablets by mouth See admin instructions. Take 2.5 mg by mouth in the evening on Tue and Thur.  Take 5 mg every evening on Mon, Wed, Fri, Sat, and Sun Patient not taking: Reported on 03/02/2021    [provider]    Physical Exam: Vitals:   03/01/21 2354 03/02/21 0000 03/02/21 0028 03/02/21 0127  BP:  131/84  132/63  Pulse:  91  93  Resp:  (!) 21  20  Temp: (!) 101.7 F (38.7 C)  (!) 101.8 F (38.8 C) 99.8 F (37.7 C)  TempSrc: Oral  Oral Oral  SpO2:  97%  98%  Weight:      Height:         Vitals:   03/01/21 2354 03/02/21 0000 03/02/21 0028 03/02/21 0127  BP:  131/84  132/63  Pulse:  91  93  Resp:  (!) 21  20  Temp: (!) 101.7 F (38.7 C)  (!)  101.8 F (38.8 C) 99.8 F (37.7 C)  TempSrc: Oral  Oral Oral  SpO2:  97%  98%  Weight:      Height:          Constitutional:  Somnolent but will briefly awaken when name is called. Not in any apparent distress HEENT:      Head: Normocephalic and atraumatic.         Eyes: PERLA, EOMI, Conjunctivae are normal. Sclera is non-icteric.       Mouth/Throat: Mucous membranes are moist.       Neck: Supple with no signs  of meningismus. Cardiovascular:  Tachycardic. No murmurs, gallops, or rubs. 2+ symmetrical distal pulses are present . No JVD. No LE edema Respiratory: Respiratory effort normal .Lungs sounds clear bilaterally. No wheezes, crackles, or rhonchi.  Gastrointestinal: Soft, non tender, and non distended with positive bowel sounds.  Genitourinary: No CVA tenderness. Musculoskeletal: Nontender with normal range of motion in all extremities. No cyanosis, or erythema of extremities. Neurologic:  Face is symmetric. Moving all extremities. No gross focal neurologic deficits resting tremor. Skin: Skin is warm, dry.  No rash or ulcers Psychiatric: Mood and affect unable to assess due to lethargy   Labs on Admission: I have personally reviewed following labs and imaging studies  CBC: Recent Labs  Lab 03/01/21 2113  WBC 6.1  NEUTROABS 5.8  HGB 13.0  HCT 41.0  MCV 88.9  PLT 696   Basic Metabolic Panel: Recent Labs  Lab 03/01/21 2113  NA 135  K 4.4  CL 103  CO2 23  GLUCOSE 159*  BUN 36*  CREATININE 1.52*  CALCIUM 8.7*   GFR: Estimated Creatinine Clearance: 32.6 mL/min (A) (by C-G formula based on SCr of 1.52 mg/dL (H)). Liver Function Tests: Recent Labs  Lab 03/01/21 2113  AST 14*  ALT <5  ALKPHOS 143*  BILITOT 1.1  PROT 7.0  ALBUMIN 3.2*   No results for input(s): LIPASE, AMYLASE in the last 168 hours. No results for input(s): AMMONIA in the last 168 hours. Coagulation Profile: No results for input(s): INR, PROTIME in the last 168 hours. Cardiac  Enzymes: No results for input(s): CKTOTAL, CKMB, CKMBINDEX, TROPONINI in the last 168 hours. BNP (last 3 results) No results for input(s): PROBNP in the last 8760 hours. HbA1C: No results for input(s): HGBA1C in the last 72 hours. CBG: No results for input(s): GLUCAP in the last 168 hours. Lipid Profile: No results for input(s): CHOL, HDL, LDLCALC, TRIG, CHOLHDL, LDLDIRECT in the last 72 hours. Thyroid Function Tests: No results for input(s): TSH, T4TOTAL, FREET4, T3FREE, THYROIDAB in the last 72 hours. Anemia Panel: No results for input(s): VITAMINB12, FOLATE, FERRITIN, TIBC, IRON, RETICCTPCT in the last 72 hours. Urine analysis:    Component Value Date/Time   COLORURINE YELLOW (A) 03/01/2021 2245   APPEARANCEUR HAZY (A) 03/01/2021 2245   LABSPEC 1.013 03/01/2021 2245   PHURINE 5.0 03/01/2021 2245   GLUCOSEU NEGATIVE 03/01/2021 2245   HGBUR MODERATE (A) 03/01/2021 2245   BILIRUBINUR NEGATIVE 03/01/2021 2245   KETONESUR 5 (A) 03/01/2021 2245   PROTEINUR 100 (A) 03/01/2021 2245   NITRITE NEGATIVE 03/01/2021 2245   LEUKOCYTESUR LARGE (A) 03/01/2021 2245    Radiological Exams on Admission: DG Chest Port 1 View  Result Date: 03/01/2021 CLINICAL DATA:  Fever and altered mental status. EXAM: PORTABLE CHEST 1 VIEW COMPARISON:  None. FINDINGS: Mildly decreased lung volumes are seen which is likely secondary to the degree of patient inspiration. Mild, diffuse, chronic appearing increased interstitial lung markings are noted. Very mild scarring and/or atelectasis is seen within the left lung base. There is mild elevation of the right hemidiaphragm. There is no evidence of a pleural effusion or pneumothorax. The cardiac silhouette is mildly enlarged. Moderate severity calcification of the aortic arch is noted. The visualized skeletal structures are unremarkable. IMPRESSION: Chronic appearing increased interstitial lung markings with very mild left basilar scarring and/or atelectasis.  Electronically Signed   By: Virgina Norfolk M.D.   On: 03/01/2021 22:06     Assessment/Plan 85 year old male with history of chronic A. fib and history of aortic  valve replacement on anticoagulation, Parkinson's disease, HTN,CKD lllb and BPH who recently had an indwelling Foley catheter for 3 weeks, removed on 5/16 presenting with fever chills and lethargy    Sepsis secondary to UTI [facility])   Complicated UTI (urinary tract infection)   BPH with recent indwelling Foley removed on 5/16 - Continue cefepime - Continue IV fluids, and supportive care - Follow cultures - Consult urology in the a.m. - Flomax to replace Rapaflo    Chronic atrial fibrillation (HCC)   H/O aortic valve replacement    Chronic anticoagulation - Continue Coumadin, with pharmacy to monitor - Not on rate control agents pending med rec    Parkinson's disease ([place]) - Continue carbidopa and selegiline    Hypertension - Continue amlodipine    DVT prophylaxis: Coumadin code Status: full code  Family Communication: Wife at bedside Disposition Plan: Back to previous home environment Consults called: none  Status:At the time of admission, it appears that the appropriate admission status for this patient is INPATIENT. This is judged to be reasonable and necessary in order to provide the required intensity of service to ensure the patient's safety given the presenting symptoms, physical exam findings, and initial radiographic and laboratory data in the context of their  Comorbid conditions.   Patient requires inpatient status due to high intensity of service, high risk for further deterioration and high frequency of surveillance required.   I certify that at the point of admission it is my clinical judgment that the patient will require inpatient hospital care spanning beyond [place] MD [facility]     03/02/2021, 3:34 AM

## 2021-03-02 NOTE — Progress Notes (Signed)
PHARMACY - PHYSICIAN COMMUNICATION CRITICAL VALUE ALERT - BLOOD CULTURE IDENTIFICATION (BCID)  Jonathon Welch is an 85 y.o. male who presented to [facility] on 03/01/2021 with a chief complaint of chills and lethargy.  4/24 Urine cx with C koseri (suscpetible to cefazolin, ceftriaxone, quinolone, TMP/SMZ)  Assessment:  Patient with indwelling foley catheter x3 weeks for retention from BPH.  Foley was recently removed (5/16) in urologist office.  5/16 Blood cx with GNR,  Only enterobacterales group of bacteria detected, no specific organism detected on BCID panel.  Based on recent urine cx, suspect causative organism will be citrobacter (this is not on BCID panel).    Name of physician (or Provider) Contacted: Dr Billie Ruddy  Current antibiotics: Cefepime  Changes to prescribed antibiotics recommended:  Recommendations accepted by provider - change to ceftriaxone  Results for orders placed or performed during the hospital encounter of 03/01/21  Blood Culture ID Panel (Reflexed) (Collected: 03/01/2021  9:13 PM)  Result Value Ref Range   Enterococcus faecalis NOT DETECTED NOT DETECTED   Enterococcus Faecium NOT DETECTED NOT DETECTED   Listeria monocytogenes NOT DETECTED NOT DETECTED   Staphylococcus species NOT DETECTED NOT DETECTED   Staphylococcus aureus (BCID) NOT DETECTED NOT DETECTED   Staphylococcus epidermidis NOT DETECTED NOT DETECTED   Staphylococcus lugdunensis NOT DETECTED NOT DETECTED   Streptococcus species NOT DETECTED NOT DETECTED   Streptococcus agalactiae NOT DETECTED NOT DETECTED   Streptococcus pneumoniae NOT DETECTED NOT DETECTED   Streptococcus pyogenes NOT DETECTED NOT DETECTED   A.calcoaceticus-baumannii NOT DETECTED NOT DETECTED   Bacteroides fragilis NOT DETECTED NOT DETECTED   Enterobacterales DETECTED (A) NOT DETECTED   Enterobacter cloacae complex NOT DETECTED NOT DETECTED   Escherichia coli NOT DETECTED NOT DETECTED   Klebsiella aerogenes NOT DETECTED NOT DETECTED    Klebsiella oxytoca NOT DETECTED NOT DETECTED   Klebsiella pneumoniae NOT DETECTED NOT DETECTED   Proteus species NOT DETECTED NOT DETECTED   Salmonella species NOT DETECTED NOT DETECTED   Serratia marcescens NOT DETECTED NOT DETECTED   Haemophilus influenzae NOT DETECTED NOT DETECTED   Neisseria meningitidis NOT DETECTED NOT DETECTED   Pseudomonas aeruginosa NOT DETECTED NOT DETECTED   Stenotrophomonas maltophilia NOT DETECTED NOT DETECTED   Candida albicans NOT DETECTED NOT DETECTED   Candida auris NOT DETECTED NOT DETECTED   Candida glabrata NOT DETECTED NOT DETECTED   Candida krusei NOT DETECTED NOT DETECTED   Candida parapsilosis NOT DETECTED NOT DETECTED   Candida tropicalis NOT DETECTED NOT DETECTED   Cryptococcus neoformans/gattii NOT DETECTED NOT DETECTED   CTX-M ESBL NOT DETECTED NOT DETECTED   Carbapenem resistance IMP NOT DETECTED NOT DETECTED   Carbapenem resistance KPC NOT DETECTED NOT DETECTED   Carbapenem resistance NDM NOT DETECTED NOT DETECTED   Carbapenem resist OXA 48 LIKE NOT DETECTED NOT DETECTED   Carbapenem resistance VIM NOT DETECTED NOT DETECTED    Doreene Eland, PharmD, BCPS.   Work Cell: [phone] 03/02/2021 11:52 AM

## 2021-03-02 NOTE — Progress Notes (Signed)
Pt complains of pain at the foley site. Wife states a towel under him was wet. RN unable to verify. RN flushed foley with 10 ml NS, no resistance. 800 ml emptied from foley. Pt states it does not hurt him anymore.

## 2021-03-02 NOTE — ED Notes (Signed)
Transport at room to take pt up to assigned room.

## 2021-03-03 DIAGNOSIS — I482 Chronic atrial fibrillation, unspecified: Secondary | ICD-10-CM

## 2021-03-03 DIAGNOSIS — G20 Parkinson's disease: Secondary | ICD-10-CM

## 2021-03-03 LAB — BASIC METABOLIC PANEL
Anion gap: 8 (ref 5–15)
BUN: 36 mg/dL — ABNORMAL HIGH (ref 8–23)
CO2: 22 mmol/L (ref 22–32)
Calcium: 8.5 mg/dL — ABNORMAL LOW (ref 8.9–10.3)
Chloride: 106 mmol/L (ref 98–111)
Creatinine, Ser: 1.31 mg/dL — ABNORMAL HIGH (ref 0.61–1.24)
GFR, Estimated: 54 mL/min — ABNORMAL LOW (ref >60)
Glucose, Bld: 106 mg/dL — ABNORMAL HIGH (ref 70–99)
Potassium: 4.5 mmol/L (ref 3.5–5.1)
Sodium: 136 mmol/L (ref 135–145)

## 2021-03-03 LAB — CBC
HCT: 41.3 % (ref 39.0–52.0)
Hemoglobin: 13.2 g/dL (ref 13.0–17.0)
MCH: 28.1 pg (ref 26.0–34.0)
MCHC: 32 g/dL (ref 30.0–36.0)
MCV: 88.1 fL (ref 80.0–100.0)
Platelets: 181 10*3/uL (ref 150–400)
RBC: 4.69 MIL/uL (ref 4.22–5.81)
RDW: 14.6 % (ref 11.5–15.5)
WBC: 11.8 10*3/uL — ABNORMAL HIGH (ref 4.0–10.5)
nRBC: 0 % (ref 0.0–0.2)

## 2021-03-03 LAB — MAGNESIUM: Magnesium: 1.8 mg/dL (ref 1.7–2.4)

## 2021-03-03 MED ORDER — FLEET ENEMA 7-19 GM/118ML RE ENEM: 1.0000 | ENEMA | Freq: Every day | RECTAL | Status: DC | PRN

## 2021-03-03 MED ORDER — BISACODYL 5 MG PO TBEC
5.0000 mg | DELAYED_RELEASE_TABLET | Freq: Every day | ORAL | Status: DC | PRN
  Administered 2021-03-04: 5 mg via ORAL
  Filled 2021-03-03: qty 1

## 2021-03-03 MED ORDER — DOCUSATE SODIUM 100 MG PO CAPS
100.0000 mg | ORAL_CAPSULE | Freq: Two times a day (BID) | ORAL | Status: DC | PRN
Start: 2021-03-03 — End: 2021-03-05

## 2021-03-03 NOTE — Progress Notes (Addendum)
PROGRESS NOTE    Jonathon Welch  XNT:[id number] DOB: 1936/10/17 DOA: 03/01/2021 PCP: Rusty Aus, MD   Chief complaint.  Weakness. Brief Narrative:   Jonathon Welch is a 85 y.o. male with medical history significant for Chronic A. fib and history of aortic valve replacement on anticoagulation, Parkinson's disease, HTN,CKD lllb and BPH who recently had an indwelling Foley catheter for 3 weeks, removed 5/16 at urology office.  He came to the hospital on 5/16 with complaints of generalized weakness and shaking chills.  Temperature was 102.6.  He was found to have profound elevation of procalcitonin level with abnormal urine.  He was diagnosed with acute urinary tract infection with sepsis.  He also had Foley catheter anchored again due to urinary retention. Urine culture pending, Rocephin started.   Assessment & Plan:   Principal Problem:   Sepsis secondary to UTI [facility]) Active Problems:   Chronic atrial fibrillation (HCC)   Parkinson's disease ([place])   H/O aortic valve replacement   Hypertension   Complicated UTI (urinary tract infection)   Chronic anticoagulation  #1.  Sepsis secondary to UTI. Gram-negative rods septicemia. Complicated UTI most likely due to Foley catheter. Benign prostate hypertrophy with urinary retention. Patient met sepsis criteria at the time admission with a high fever tachycardia.  Patient also has chronic kidney disease stage IIIa. Currently patient is hemodynamically stable.  No need for additional fluids. Patient also had urinary retention, Foley catheter will be continued. Blood culture positive for gram-negative rods, will continue higher dose of Rocephin. Patient also has profound elevation of procalcitonin level, I will recheck level again tomorrow.  Also repeat blood cultures tomorrow.  #2.  Chronic kidney disease stage IIIa. Appears to be stable.  3.  Chronic atrial fibrillation. Continue apixaban.  4.  Parkinson disease. Continue home  medicines.     DVT prophylaxis: Eliquis Code Status: Full Family Communication:  Disposition Plan:  .   Status is: Inpatient  Remains inpatient appropriate because:Inpatient level of care appropriate due to severity of illness   Dispo: The patient is from: Home              Anticipated d/c is to: Home              Patient currently is not medically stable to d/c.   Difficult to place patient No        I/O last 3 completed shifts: In: 1567.8 [I.V.:1367.8; IV Piggyback:200] Out: 2400 [Urine:2400] No intake/output data recorded.     Consultants:   None  Procedures: None  Antimicrobials: Rocephin  Subjective: Patient feels better today, no additional fever or chills. Foley catheter in place, no abdominal pain or nausea vomiting. No chest pain or palpitation. No short of breath or cough. No headache or dizziness.   Objective: Vitals:   03/02/21 2001 03/03/21 0045 03/03/21 0541 03/03/21 0759  BP: 133/74 (!) 141/80 (!) 149/88 (!) 123/53  Pulse: 75 62 (!) 55 75  Resp: 18 16 16 18   Temp: 98.7 F (37.1 C) 98.5 F (36.9 C) (!) 97.5 F (36.4 C) 97.7 F (36.5 C)  TempSrc: Oral Oral Oral Oral  SpO2: 94% 94% 93% 95%  Weight:      Height:        Intake/Output Summary (Last 24 hours) at 03/03/2021 1042 Last data filed at 03/03/2021 0655 Gross per 24 hour  Intake 1011.42 ml  Output 1600 ml  Net -588.58 ml   Filed Weights   03/01/21 2111  Weight: 70.3  kg    Examination:  General exam: Appears calm and comfortable  Respiratory system: Clear to auscultation. Respiratory effort normal. Cardiovascular system: S1 & S2 heard, RRR. No JVD, murmurs, rubs, gallops or clicks. No pedal edema. Gastrointestinal system: Abdomen is nondistended, soft and nontender. No organomegaly or masses felt. Normal bowel sounds heard. Central nervous system: Alert and oriented x2. No focal neurological deficits. Extremities: Symmetric 5 x 5 power. Skin: No rashes, lesions or  ulcers Psychiatry:  Mood & affect appropriate.     Data Reviewed: I have personally reviewed following labs and imaging studies  CBC: Recent Labs  Lab 03/01/21 2113 03/03/21 0513  WBC 6.1 11.8*  NEUTROABS 5.8  --   HGB 13.0 13.2  HCT 41.0 41.3  MCV 88.9 88.1  PLT 216 098   Basic Metabolic Panel: Recent Labs  Lab 03/01/21 2113 03/03/21 0513  NA 135 136  K 4.4 4.5  CL 103 106  CO2 23 22  GLUCOSE 159* 106*  BUN 36* 36*  CREATININE 1.52* 1.31*  CALCIUM 8.7* 8.5*  MG  --  1.8   GFR: Estimated Creatinine Clearance: 37.8 mL/min (A) (by C-G formula based on SCr of 1.31 mg/dL (H)). Liver Function Tests: Recent Labs  Lab 03/01/21 2113  AST 14*  ALT <5  ALKPHOS 143*  BILITOT 1.1  PROT 7.0  ALBUMIN 3.2*   No results for input(s): LIPASE, AMYLASE in the last 168 hours. No results for input(s): AMMONIA in the last 168 hours. Coagulation Profile: Recent Labs  Lab 03/02/21 0535  INR 1.5*   Cardiac Enzymes: No results for input(s): CKTOTAL, CKMB, CKMBINDEX, TROPONINI in the last 168 hours. BNP (last 3 results) No results for input(s): PROBNP in the last 8760 hours. HbA1C: No results for input(s): HGBA1C in the last 72 hours. CBG: No results for input(s): GLUCAP in the last 168 hours. Lipid Profile: No results for input(s): CHOL, HDL, LDLCALC, TRIG, CHOLHDL, LDLDIRECT in the last 72 hours. Thyroid Function Tests: No results for input(s): TSH, T4TOTAL, FREET4, T3FREE, THYROIDAB in the last 72 hours. Anemia Panel: No results for input(s): VITAMINB12, FOLATE, FERRITIN, TIBC, IRON, RETICCTPCT in the last 72 hours. Sepsis Labs: Recent Labs  Lab 03/01/21 2113 03/01/21 2320 03/02/21 0535  PROCALCITON  --   --  104.12  LATICACIDVEN 2.4* 1.6  --     Recent Results (from the past 240 hour(s))  CULTURE, URINE COMPREHENSIVE     Status: None (Preliminary result)   Collection Time: 03/01/21  3:26 PM   Specimen: Urine   UR  Result Value Ref Range Status   Urine  Culture, Comprehensive Preliminary report  Preliminary   Organism ID, Bacteria Comment  Preliminary    Comment: Microbiological testing to rule out the presence of possible pathogens is in progress.    Organism ID, Bacteria Comment  Preliminary    Comment: Microbiological testing to rule out the presence of possible pathogens is in progress.   Blood culture (routine single)     Status: None (Preliminary result)   Collection Time: 03/01/21  9:13 PM   Specimen: BLOOD  Result Value Ref Range Status   Specimen Description   Final    BLOOD BLOOD LEFT FOREARM Performed at [facility], [address] [place], [place] [address]    Special Requests   Final    BOTTLES DRAWN AEROBIC AND ANAEROBIC Blood Culture results may not be optimal due to an excessive volume of blood received in culture bottles Performed at [facility],  [place], [place] [address]    Culture  Setup Time   Final    IN BOTH AEROBIC AND ANAEROBIC BOTTLES GRAM NEGATIVE RODS IN CHAINS CRITICAL RESULT CALLED TO, READ BACK BY AND VERIFIED WITH: CHARLES SHANLEVER @1139  ON 05.17.22.[place]    Culture   Final    GRAM NEGATIVE RODS IDENTIFICATION AND SUSCEPTIBILITIES TO FOLLOW Performed at [facility], [place] [address] [place], [place] [address]    Report Status PENDING  Incomplete  Blood Culture ID Panel (Reflexed)     Status: Abnormal   Collection Time: 03/01/21  9:13 PM  Result Value Ref Range Status   Enterococcus faecalis NOT DETECTED NOT DETECTED Final   Enterococcus Faecium NOT DETECTED NOT DETECTED Final   Listeria monocytogenes NOT DETECTED NOT DETECTED Final   Staphylococcus species NOT DETECTED NOT DETECTED Final   Staphylococcus aureus (BCID) NOT DETECTED NOT DETECTED Final   Staphylococcus epidermidis NOT DETECTED NOT DETECTED Final   Staphylococcus lugdunensis NOT DETECTED NOT DETECTED Final   Streptococcus species NOT DETECTED NOT DETECTED Final   Streptococcus  agalactiae NOT DETECTED NOT DETECTED Final   Streptococcus pneumoniae NOT DETECTED NOT DETECTED Final   Streptococcus pyogenes NOT DETECTED NOT DETECTED Final   A.calcoaceticus-baumannii NOT DETECTED NOT DETECTED Final   Bacteroides fragilis NOT DETECTED NOT DETECTED Final   Enterobacterales DETECTED (A) NOT DETECTED Final    Comment: Enterobacterales represent a large order of gram negative bacteria, not a single organism. Refer to culture for further identification. RESULT CALLED TO, READ BACK BY AND VERIFIED WITH: CHARLES SHANLEVER @1139  ON [id number]    Enterobacter cloacae complex NOT DETECTED NOT DETECTED Final   Escherichia coli NOT DETECTED NOT DETECTED Final   Klebsiella aerogenes NOT DETECTED NOT DETECTED Final   Klebsiella oxytoca NOT DETECTED NOT DETECTED Final   Klebsiella pneumoniae NOT DETECTED NOT DETECTED Final   Proteus species NOT DETECTED NOT DETECTED Final   Salmonella species NOT DETECTED NOT DETECTED Final   Serratia marcescens NOT DETECTED NOT DETECTED Final   Haemophilus influenzae NOT DETECTED NOT DETECTED Final   Neisseria meningitidis NOT DETECTED NOT DETECTED Final   Pseudomonas aeruginosa NOT DETECTED NOT DETECTED Final   Stenotrophomonas maltophilia NOT DETECTED NOT DETECTED Final   Candida albicans NOT DETECTED NOT DETECTED Final   Candida auris NOT DETECTED NOT DETECTED Final   Candida glabrata NOT DETECTED NOT DETECTED Final   Candida krusei NOT DETECTED NOT DETECTED Final   Candida parapsilosis NOT DETECTED NOT DETECTED Final   Candida tropicalis NOT DETECTED NOT DETECTED Final   Cryptococcus neoformans/gattii NOT DETECTED NOT DETECTED Final   CTX-M ESBL NOT DETECTED NOT DETECTED Final   Carbapenem resistance IMP NOT DETECTED NOT DETECTED Final   Carbapenem resistance KPC NOT DETECTED NOT DETECTED Final   Carbapenem resistance NDM NOT DETECTED NOT DETECTED Final   Carbapenem resist OXA 48 LIKE NOT DETECTED NOT DETECTED Final   Carbapenem  resistance VIM NOT DETECTED NOT DETECTED Final    Comment: Performed at [facility], [address] [place], [place] [address]  Resp Panel by RT-PCR (Flu A&B, Covid) Nasopharyngeal Swab     Status: None   Collection Time: 03/01/21 10:22 PM   Specimen: Nasopharyngeal Swab; Nasopharyngeal(NP) swabs in vial transport medium  Result Value Ref Range Status   SARS Coronavirus 2 by RT PCR NEGATIVE NEGATIVE Final    Comment: (NOTE) SARS-CoV-2 target nucleic acids are NOT DETECTED.  The SARS-CoV-2 RNA is generally detectable in upper respiratory specimens during  the acute phase of infection. The lowest concentration of SARS-CoV-2 viral copies this assay can detect is 138 copies/mL. A negative result does not preclude SARS-Cov-2 infection and should not be used as the sole basis for treatment or other patient management decisions. A negative result may occur with  improper specimen collection/handling, submission of specimen other than nasopharyngeal swab, presence of viral mutation(s) within the areas targeted by this assay, and inadequate number of viral copies(<138 copies/mL). A negative result must be combined with clinical observations, patient history, and epidemiological information. The expected result is Negative.  Fact Sheet for Patients:  EntrepreneurPulse.com.au  Fact Sheet for Healthcare Providers:  IncredibleEmployment.be  This test is no t yet approved or cleared by the [place] FDA and  has been authorized for detection and/or diagnosis of SARS-CoV-2 by FDA under an Emergency Use Authorization (EUA). This EUA will remain  in effect (meaning this test can be used) for the duration of the COVID-19 declaration under Section 564(b)(1) of the Act, 21 U.S.C.section 360bbb-3(b)(1), unless the authorization is terminated  or revoked sooner.       Influenza A by PCR NEGATIVE NEGATIVE Final   Influenza B by PCR NEGATIVE NEGATIVE  Final    Comment: (NOTE) The Xpert Xpress SARS-CoV-2/FLU/RSV plus assay is intended as an aid in the diagnosis of influenza from Nasopharyngeal swab specimens and should not be used as a sole basis for treatment. Nasal washings and aspirates are unacceptable for Xpert Xpress SARS-CoV-2/FLU/RSV testing.  Fact Sheet for Patients: EntrepreneurPulse.com.au  Fact Sheet for Healthcare Providers: IncredibleEmployment.be  This test is not yet approved or cleared by the [place] FDA and has been authorized for detection and/or diagnosis of SARS-CoV-2 by FDA under an Emergency Use Authorization (EUA). This EUA will remain in effect (meaning this test can be used) for the duration of the COVID-19 declaration under Section 564(b)(1) of the Act, 21 U.S.C. section 360bbb-3(b)(1), unless the authorization is terminated or revoked.  Performed at [facility], [address] [place], [place] [address]   Urine culture     Status: Abnormal (Preliminary result)   Collection Time: 03/01/21 10:45 PM   Specimen: In/Out Cath Urine  Result Value Ref Range Status   Specimen Description   Final    IN/OUT CATH URINE Performed at [facility], [address] [place], [place] [address]    Special Requests   Final    NONE Performed at [facility], [address] [place], [place] [address]    Culture (A)  Final    >=100,000 COLONIES/mL CITROBACTER KOSERI SUSCEPTIBILITIES TO FOLLOW Performed at [facility], [place] [address], [place], [place] [address]    Report Status PENDING  Incomplete         Radiology Studies: DG Chest Port 1 View  Result Date: 03/01/2021 CLINICAL DATA:  Fever and altered mental status. EXAM: PORTABLE CHEST 1 VIEW COMPARISON:  None. FINDINGS: Mildly decreased lung volumes are seen which is likely secondary to the degree of patient inspiration. Mild, diffuse, chronic appearing increased interstitial  lung markings are noted. Very mild scarring and/or atelectasis is seen within the left lung base. There is mild elevation of the right hemidiaphragm. There is no evidence of a pleural effusion or pneumothorax. The cardiac silhouette is mildly enlarged. Moderate severity calcification of the aortic arch is noted. The visualized skeletal structures are unremarkable. IMPRESSION: Chronic appearing increased interstitial lung markings with very mild left basilar scarring and/or atelectasis. Electronically Signed   By:  Virgina Norfolk M.D.   On: 03/01/2021 22:06        Scheduled Meds: . apixaban  2.5 mg Oral BID  . carbidopa-levodopa  2 tablet Oral QID  . Chlorhexidine Gluconate Cloth  6 each Topical Daily  . selegiline  5 mg Oral BID  . tamsulosin  0.4 mg Oral Daily   Continuous Infusions: . cefTRIAXone (ROCEPHIN)  IV 2 g (03/03/21 0629)  . lactated ringers 50 mL/hr at 03/02/21 1027     LOS: 2 days    Time spent: 28 minutes    Sharen Hones, MD [facility]   To contact the attending provider between 7A-7P or the covering provider during after hours 7P-7A, please log into the web site [email or web address] and access using universal [place] password for that web site. If you do not have the password, please call the hospital operator.  03/03/2021, 10:42 AM

## 2021-03-04 LAB — CBC
HCT: 39.9 % (ref 39.0–52.0)
Hemoglobin: 12.7 g/dL — ABNORMAL LOW (ref 13.0–17.0)
MCH: 28.3 pg (ref 26.0–34.0)
MCHC: 31.8 g/dL (ref 30.0–36.0)
MCV: 89.1 fL (ref 80.0–100.0)
Platelets: 177 10*3/uL (ref 150–400)
RBC: 4.48 MIL/uL (ref 4.22–5.81)
RDW: 14.1 % (ref 11.5–15.5)
WBC: 9.8 10*3/uL (ref 4.0–10.5)
nRBC: 0 % (ref 0.0–0.2)

## 2021-03-04 LAB — URINE CULTURE: Culture: >=100000 — AB

## 2021-03-04 LAB — CULTURE, BLOOD (SINGLE)

## 2021-03-04 LAB — BASIC METABOLIC PANEL
Anion gap: 9 (ref 5–15)
BUN: 39 mg/dL — ABNORMAL HIGH (ref 8–23)
CO2: 19 mmol/L — ABNORMAL LOW (ref 22–32)
Calcium: 8.3 mg/dL — ABNORMAL LOW (ref 8.9–10.3)
Chloride: 106 mmol/L (ref 98–111)
Creatinine, Ser: 1.44 mg/dL — ABNORMAL HIGH (ref 0.61–1.24)
GFR, Estimated: 49 mL/min — ABNORMAL LOW (ref >60)
Glucose, Bld: 186 mg/dL — ABNORMAL HIGH (ref 70–99)
Potassium: 4.4 mmol/L (ref 3.5–5.1)
Sodium: 134 mmol/L — ABNORMAL LOW (ref 135–145)

## 2021-03-04 LAB — PROCALCITONIN: Procalcitonin: 66.03 ng/mL

## 2021-03-04 LAB — MAGNESIUM: Magnesium: 1.9 mg/dL (ref 1.7–2.4)

## 2021-03-04 MED ORDER — AMLODIPINE BESYLATE 5 MG PO TABS
2.5000 mg | ORAL_TABLET | Freq: Every day | ORAL | Status: DC
  Administered 2021-03-05: 2.5 mg via ORAL
  Filled 2021-03-04 (×2): qty 1

## 2021-03-04 MED ORDER — SODIUM BICARBONATE 650 MG PO TABS
650.0000 mg | ORAL_TABLET | Freq: Two times a day (BID) | ORAL | Status: DC
  Administered 2021-03-04 – 2021-03-05 (×3): 650 mg via ORAL
  Filled 2021-03-04 (×4): qty 1

## 2021-03-04 NOTE — [place] Notes (Signed)
Patient transferred to [place], Verbal Report given to receiving RN Freda Munro, Patient transferred via bed with all personal belongings.

## 2021-03-04 NOTE — Progress Notes (Signed)
This 85 yo male pt was transferred to 218 from 1A. Adm post fall and sepsis. VSS.  Initial assessment completed.

## 2021-03-04 NOTE — Progress Notes (Addendum)
PROGRESS NOTE    Jonathon Welch  MRN:[id number] DOB: 06/30/1936 DOA: 03/01/2021 PCP: Miller, Mark F, MD   Chief complaint.  Weakness. Brief Narrative:  Jonathon Welch a 84 y.o.malewith medical history significant forChronic A. fib and history of aortic valve replacement on anticoagulation, Parkinson's disease, HTN,CKD lllb and BPH who recently had an indwelling Foley catheter for 3 weeks, removed 5/16 at urology office.  He came to the hospital on 5/16 with complaints of generalized weakness and shaking chills.  Temperature was 102.6.  He was found to have profound elevation of procalcitonin level with abnormal urine.  He was diagnosed with acute urinary tract infection with sepsis.  He also had Foley catheter anchored again due to urinary retention. He is started on Rocephin.  Blood culture and urine culture both positive for Citrobacter.  Repeat culture performed on 5/19  Assessment & Plan:   Principal Problem:   Sepsis secondary to UTI (HCC) Active Problems:   Chronic atrial fibrillation (HCC)   Parkinson's disease (HCC)   H/O aortic valve replacement   Hypertension   Complicated UTI (urinary tract infection)   Chronic anticoagulation  #1.  Sepsis with Citrobacter septicemia secondary to UTI. Complicated Citrobacter UTI secondary to Foley catheter. Benign prostate hypertrophy with urinary retention. Patient appears to be doing better.  Patient has significant urinary retention, will continue Foley catheter. Continue Rocephin for now.  Await for second blood culture performed today. Most likely patient be treated with Cipro for additional 14 days after discharge. Patient wife believes that patient is weaker than normal, will obtain PT/OT evaluation and treat.  Will consider SNF if recommended by therapist.  2. Chronic kidney disease stage IIIa. Metabolic acidosis. Renal function stable.  Start the oral sodium bicarbonate.  3. Chronic atrial fibrillation  Continue  Eliquis  4.  Parkinson disease. Appears to be stable.  DVT prophylaxis: Eliquis Code Status: Full Family Communication: Discussed with the patient some over the phone, met patient wife at bedside.  All questions answered. Disposition Plan:  .   Status is: Inpatient  Remains inpatient appropriate because:Inpatient level of care appropriate due to severity of illness   Dispo: The patient is from: Home              Anticipated d/c is to: To be determined.              Patient currently is not medically stable to d/c.   Difficult to place patient No        I/O last 3 completed shifts: In: 120 [P.O.:120] Out: 1800 [Urine:1800] No intake/output data recorded.     Consultants:   None  Procedures: None  Antimicrobials: Ceftriaxone.  Subjective: Patient seems doing well today, he is on 2 L oxygen, but does not feel any short of breath.  No cough. He has baseline confusion. No abdominal pain or nausea vomiting. No fever or chills. No dysuria hematuria, Foley in place.   Objective: Vitals:   03/04/21 1227 03/04/21 1232 03/04/21 1234 03/04/21 1300  BP: (!) 141/58     Pulse: 73 96 (!) 33 (!) 59  Resp: 16     Temp: 97.6 F (36.4 C)     TempSrc: Oral     SpO2: 98% 99% 100%   Weight:      Height:        Intake/Output Summary (Last 24 hours) at 03/04/2021 1347 Last data filed at 03/04/2021 0532 Gross per 24 hour  Intake 120 ml  Output 1000 ml    Net -880 ml   Filed Weights   03/01/21 2111  Weight: 70.3 kg    Examination:  General exam: Appears calm and comfortable  Respiratory system: Clear to auscultation. Respiratory effort normal. Cardiovascular system: S1 & S2 heard, RRR. No JVD, murmurs, rubs, gallops or clicks. No pedal edema. Gastrointestinal system: Abdomen is nondistended, soft and nontender. No organomegaly or masses felt. Normal bowel sounds heard. Central nervous system: Alert and oriented x2. No focal neurological deficits. Extremities:  Symmetric 5 x 5 power. Skin: No rashes, lesions or ulcers Psychiatry:  Mood & affect appropriate.     Data Reviewed: I have personally reviewed following labs and imaging studies  CBC: Recent Labs  Lab 03/01/21 2113 03/03/21 0513 03/04/21 0021  WBC 6.1 11.8* 9.8  NEUTROABS 5.8  --   --   HGB 13.0 13.2 12.7*  HCT 41.0 41.3 39.9  MCV 88.9 88.1 89.1  PLT 216 181 681   Basic Metabolic Panel: Recent Labs  Lab 03/01/21 2113 03/03/21 0513 03/04/21 0021  NA 135 136 134*  K 4.4 4.5 4.4  CL 103 106 106  CO2 23 22 19*  GLUCOSE 159* 106* 186*  BUN 36* 36* 39*  CREATININE 1.52* 1.31* 1.44*  CALCIUM 8.7* 8.5* 8.3*  MG  --  1.8 1.9   GFR: Estimated Creatinine Clearance: 34.4 mL/min (A) (by C-G formula based on SCr of 1.44 mg/dL (H)). Liver Function Tests: Recent Labs  Lab 03/01/21 2113  AST 14*  ALT <5  ALKPHOS 143*  BILITOT 1.1  PROT 7.0  ALBUMIN 3.2*   No results for input(s): LIPASE, AMYLASE in the last 168 hours. No results for input(s): AMMONIA in the last 168 hours. Coagulation Profile: Recent Labs  Lab 03/02/21 0535  INR 1.5*   Cardiac Enzymes: No results for input(s): CKTOTAL, CKMB, CKMBINDEX, TROPONINI in the last 168 hours. BNP (last 3 results) No results for input(s): PROBNP in the last 8760 hours. HbA1C: No results for input(s): HGBA1C in the last 72 hours. CBG: No results for input(s): GLUCAP in the last 168 hours. Lipid Profile: No results for input(s): CHOL, HDL, LDLCALC, TRIG, CHOLHDL, LDLDIRECT in the last 72 hours. Thyroid Function Tests: No results for input(s): TSH, T4TOTAL, FREET4, T3FREE, THYROIDAB in the last 72 hours. Anemia Panel: No results for input(s): VITAMINB12, FOLATE, FERRITIN, TIBC, IRON, RETICCTPCT in the last 72 hours. Sepsis Labs: Recent Labs  Lab 03/01/21 2113 03/01/21 2320 03/02/21 0535 03/04/21 0021  PROCALCITON  --   --  104.12 66.03  LATICACIDVEN 2.4* 1.6  --   --     Recent Results (from the past 240  hour(s))  CULTURE, URINE COMPREHENSIVE     Status: None (Preliminary result)   Collection Time: 03/01/21  3:26 PM   Specimen: Urine   UR  Result Value Ref Range Status   Urine Culture, Comprehensive Preliminary report  Preliminary   Organism ID, Bacteria Comment  Preliminary    Comment: Microbiological testing to rule out the presence of possible pathogens is in progress.    Organism ID, Bacteria Comment  Preliminary    Comment: Microbiological testing to rule out the presence of possible pathogens is in progress.   Blood culture (routine single)     Status: Abnormal   Collection Time: 03/01/21  9:13 PM   Specimen: BLOOD  Result Value Ref Range Status   Specimen Description   Final    BLOOD BLOOD LEFT FOREARM Performed at [facility], [address] [place], [place] [address]  Special Requests   Final    BOTTLES DRAWN AEROBIC AND ANAEROBIC Blood Culture results may not be optimal due to an excessive volume of blood received in culture bottles Performed at [facility], [address] [place], [place] [address]    Culture  Setup Time   Final    IN BOTH AEROBIC AND ANAEROBIC BOTTLES GRAM NEGATIVE RODS IN CHAINS CRITICAL RESULT CALLED TO, READ BACK BY AND VERIFIED WITH: CHARLES SHANLEVER @1139 ON [id number].SH Performed at [facility], [address] [place], [place] [address]    Culture CITROBACTER KOSERI (A)  Final   Report Status 03/04/2021 FINAL  Final   Organism ID, Bacteria CITROBACTER KOSERI  Final      Susceptibility   Citrobacter koseri - MIC*    CEFAZOLIN <=4 SENSITIVE Sensitive     CEFEPIME <=0.12 SENSITIVE Sensitive     CEFTAZIDIME <=1 SENSITIVE Sensitive     CEFTRIAXONE <=0.25 SENSITIVE Sensitive     CIPROFLOXACIN <=0.25 SENSITIVE Sensitive     GENTAMICIN <=1 SENSITIVE Sensitive     IMIPENEM <=0.25 SENSITIVE Sensitive     TRIMETH/SULFA <=20 SENSITIVE Sensitive     PIP/TAZO <=4 SENSITIVE Sensitive     * CITROBACTER KOSERI  Blood  Culture ID Panel (Reflexed)     Status: Abnormal   Collection Time: 03/01/21  9:13 PM  Result Value Ref Range Status   Enterococcus faecalis NOT DETECTED NOT DETECTED Final   Enterococcus Faecium NOT DETECTED NOT DETECTED Final   Listeria monocytogenes NOT DETECTED NOT DETECTED Final   Staphylococcus species NOT DETECTED NOT DETECTED Final   Staphylococcus aureus (BCID) NOT DETECTED NOT DETECTED Final   Staphylococcus epidermidis NOT DETECTED NOT DETECTED Final   Staphylococcus lugdunensis NOT DETECTED NOT DETECTED Final   Streptococcus species NOT DETECTED NOT DETECTED Final   Streptococcus agalactiae NOT DETECTED NOT DETECTED Final   Streptococcus pneumoniae NOT DETECTED NOT DETECTED Final   Streptococcus pyogenes NOT DETECTED NOT DETECTED Final   A.calcoaceticus-baumannii NOT DETECTED NOT DETECTED Final   Bacteroides fragilis NOT DETECTED NOT DETECTED Final   Enterobacterales DETECTED (A) NOT DETECTED Final    Comment: Enterobacterales represent a large order of gram negative bacteria, not a single organism. Refer to culture for further identification. RESULT CALLED TO, READ BACK BY AND VERIFIED WITH: CHARLES SHANLEVER @1139 ON [id number]    Enterobacter cloacae complex NOT DETECTED NOT DETECTED Final   Escherichia coli NOT DETECTED NOT DETECTED Final   Klebsiella aerogenes NOT DETECTED NOT DETECTED Final   Klebsiella oxytoca NOT DETECTED NOT DETECTED Final   Klebsiella pneumoniae NOT DETECTED NOT DETECTED Final   Proteus species NOT DETECTED NOT DETECTED Final   Salmonella species NOT DETECTED NOT DETECTED Final   Serratia marcescens NOT DETECTED NOT DETECTED Final   Haemophilus influenzae NOT DETECTED NOT DETECTED Final   Neisseria meningitidis NOT DETECTED NOT DETECTED Final   Pseudomonas aeruginosa NOT DETECTED NOT DETECTED Final   Stenotrophomonas maltophilia NOT DETECTED NOT DETECTED Final   Candida albicans NOT DETECTED NOT DETECTED Final   Candida auris NOT DETECTED NOT  DETECTED Final   Candida glabrata NOT DETECTED NOT DETECTED Final   Candida krusei NOT DETECTED NOT DETECTED Final   Candida parapsilosis NOT DETECTED NOT DETECTED Final   Candida tropicalis NOT DETECTED NOT DETECTED Final   Cryptococcus neoformans/gattii NOT DETECTED NOT DETECTED Final   CTX-M ESBL NOT DETECTED NOT DETECTED Final   Carbapenem resistance IMP NOT DETECTED NOT DETECTED Final   Carbapenem resistance KPC NOT DETECTED   NOT DETECTED Final   Carbapenem resistance NDM NOT DETECTED NOT DETECTED Final   Carbapenem resist OXA 48 LIKE NOT DETECTED NOT DETECTED Final   Carbapenem resistance VIM NOT DETECTED NOT DETECTED Final    Comment: Performed at [facility], [address] [place], [place] [address]  Resp Panel by RT-PCR (Flu A&B, Covid) Nasopharyngeal Swab     Status: None   Collection Time: 03/01/21 10:22 PM   Specimen: Nasopharyngeal Swab; Nasopharyngeal(NP) swabs in vial transport medium  Result Value Ref Range Status   SARS Coronavirus 2 by RT PCR NEGATIVE NEGATIVE Final    Comment: (NOTE) SARS-CoV-2 target nucleic acids are NOT DETECTED.  The SARS-CoV-2 RNA is generally detectable in upper respiratory specimens during the acute phase of infection. The lowest concentration of SARS-CoV-2 viral copies this assay can detect is 138 copies/mL. A negative result does not preclude SARS-Cov-2 infection and should not be used as the sole basis for treatment or other patient management decisions. A negative result may occur with  improper specimen collection/handling, submission of specimen other than nasopharyngeal swab, presence of viral mutation(s) within the areas targeted by this assay, and inadequate number of viral copies(<138 copies/mL). A negative result must be combined with clinical observations, patient history, and epidemiological information. The expected result is Negative.  Fact Sheet for Patients:  EntrepreneurPulse.com.au  Fact  Sheet for Healthcare Providers:  IncredibleEmployment.be  This test is no t yet approved or cleared by the [place] FDA and  has been authorized for detection and/or diagnosis of SARS-CoV-2 by FDA under an Emergency Use Authorization (EUA). This EUA will remain  in effect (meaning this test can be used) for the duration of the COVID-19 declaration under Section 564(b)(1) of the Act, 21 U.S.C.section 360bbb-3(b)(1), unless the authorization is terminated  or revoked sooner.       Influenza A by PCR NEGATIVE NEGATIVE Final   Influenza B by PCR NEGATIVE NEGATIVE Final    Comment: (NOTE) The Xpert Xpress SARS-CoV-2/FLU/RSV plus assay is intended as an aid in the diagnosis of influenza from Nasopharyngeal swab specimens and should not be used as a sole basis for treatment. Nasal washings and aspirates are unacceptable for Xpert Xpress SARS-CoV-2/FLU/RSV testing.  Fact Sheet for Patients: EntrepreneurPulse.com.au  Fact Sheet for Healthcare Providers: IncredibleEmployment.be  This test is not yet approved or cleared by the [place] FDA and has been authorized for detection and/or diagnosis of SARS-CoV-2 by FDA under an Emergency Use Authorization (EUA). This EUA will remain in effect (meaning this test can be used) for the duration of the COVID-19 declaration under Section 564(b)(1) of the Act, 21 U.S.C. section 360bbb-3(b)(1), unless the authorization is terminated or revoked.  Performed at [facility], [address] [place], [place] [address]   Urine culture     Status: Abnormal   Collection Time: 03/01/21 10:45 PM   Specimen: In/Out Cath Urine  Result Value Ref Range Status   Specimen Description   Final    IN/OUT CATH URINE Performed at [facility], [address], [place], [place] [address]    Special Requests   Final    NONE Performed at [facility], [address]  [place], [place] [address]    Culture >=100,000 COLONIES/mL CITROBACTER KOSERI (A)  Final   Report Status 03/04/2021 FINAL  Final   Organism ID, Bacteria CITROBACTER KOSERI (A)  Final      Susceptibility   Citrobacter koseri - MIC*    CEFAZOLIN <=4 SENSITIVE  Sensitive     CEFEPIME <=0.12 SENSITIVE Sensitive     CEFTRIAXONE <=0.25 SENSITIVE Sensitive     CIPROFLOXACIN <=0.25 SENSITIVE Sensitive     GENTAMICIN <=1 SENSITIVE Sensitive     IMIPENEM <=0.25 SENSITIVE Sensitive     NITROFURANTOIN 64 INTERMEDIATE Intermediate     TRIMETH/SULFA <=20 SENSITIVE Sensitive     PIP/TAZO <=4 SENSITIVE Sensitive     * >=100,000 COLONIES/mL CITROBACTER KOSERI  CULTURE, BLOOD (ROUTINE X 2) w Reflex to ID Panel     Status: None (Preliminary result)   Collection Time: 03/04/21 12:21 AM   Specimen: BLOOD  Result Value Ref Range Status   Specimen Description BLOOD BLOOD RIGHT HAND  Final   Special Requests   Final    BOTTLES DRAWN AEROBIC AND ANAEROBIC Blood Culture adequate volume   Culture   Final    NO GROWTH < 12 HOURS Performed at [facility], [address] [place], [place] [address]    Report Status PENDING  Incomplete  CULTURE, BLOOD (ROUTINE X 2) w Reflex to ID Panel     Status: None (Preliminary result)   Collection Time: 03/04/21 12:23 AM   Specimen: BLOOD  Result Value Ref Range Status   Specimen Description BLOOD BLOOD RIGHT FOREARM  Final   Special Requests   Final    BOTTLES DRAWN AEROBIC AND ANAEROBIC Blood Culture adequate volume   Culture   Final    NO GROWTH < 12 HOURS Performed at [facility], [address], [place], [place] [address]    Report Status PENDING  Incomplete         Radiology Studies: No results found.      Scheduled Meds: . amLODipine  2.5 mg Oral Daily  . apixaban  2.5 mg Oral BID  . carbidopa-levodopa  2 tablet Oral QID  . Chlorhexidine Gluconate Cloth  6 each Topical Daily  . selegiline  5 mg Oral BID  . sodium bicarbonate   650 mg Oral BID  . tamsulosin  0.4 mg Oral Daily   Continuous Infusions: . cefTRIAXone (ROCEPHIN)  IV 2 g (03/04/21 0533)  . lactated ringers Stopped (03/04/21 1308)     LOS: 3 days    Time spent: 32 minutes, more than 50% time spent on direct patient care.    Sharen Hones, MD [facility]   To contact the attending provider between 7A-7P or the covering provider during after hours 7P-7A, please log into the web site [email or web address] and access using universal [place] password for that web site. If you do not have the password, please call the hospital operator.  03/04/2021, 1:47 PM

## 2021-03-04 NOTE — Evaluation (Signed)
Physical Therapy Evaluation Patient Details Name: Jonathon Welch MRN: [id number] DOB: [birth year] Today's Date: 03/04/2021   History of Present Illness  85 y.o. male with medical history significant for Chronic A. fib and history of aortic valve replacement on anticoagulation, Parkinson's disease, HTN,CKD lllb and BPH who recently had an indwelling Foley catheter for 3 weeks, removed 5/16 at urology office.  He came to the hospital on 5/16 with complaints of generalized weakness and shaking chills.  Temperature was 102.6.  He was found to have profound elevation of procalcitonin level with abnormal urine.  He was diagnosed with acute urinary tract infection with sepsis.  He also had Foley catheter anchored again due to urinary retention.  Clinical Impression  Pt initially hesitant to do a lot with PT, but ultimately did well and showed ability to circumambulate the nurses station (~160 ft with walker, ~40 w/o AD).  He does not typically need O2 or AD, on arrival on 2L with sats at 99%, removed and on room air he stayed in the mid/high 90s, he did do better with walker with increased cadence and speed.  Pt had one small stagger step and occasional veering (R>L) but generally did well and was safe t/o the session.     Follow Up Recommendations Home health PT    Equipment Recommendations  Rolling walker with 5" wheels;3in1 (PT)    Recommendations for Other Services       Precautions / Restrictions Precautions Precautions: Fall Restrictions Weight Bearing Restrictions: No      Mobility  Bed Mobility Overal bed mobility: Needs Assistance Bed Mobility: Supine to Sit     Supine to sit: Min guard     General bed mobility comments: Pt was able to get to EOB w/o heavy assist, but did have some retropulsion needing guidance to scoot to EOB and maintain upright posture    Transfers Overall transfer level: Needs assistance Equipment used: Rolling walker (2 wheeled) Transfers: Sit to/from  Stand Sit to Stand: Min assist         General transfer comment: Initial attempt to stand w/o assist and from standard height bed was unsuccessful despite cues for hand placement.  Raised bed ~2" and gave very light CGA cuing to keep weight forward and pt was able to rise w/o issue  Ambulation/Gait Ambulation/Gait assistance: Supervision Gait Distance (Feet): 200 Feet Assistive device: Rolling walker (2 wheeled);None       General Gait Details: Initially with typical parkinsonian shuffling gait, but with increased distance was able to increase step length/cadence.  He did have a little veering and one episode of buckling but he was able to maintain balance w/o direct assist.  Pt's O2 remained in the mid/high 90s t/o the effort on room air. He did walk the final 35-40 ft w/o AD, but was more guarded and had some increased instability w/o LOBs.  Stairs            Wheelchair Mobility    Modified Rankin (Stroke Patients Only)       Balance Overall balance assessment: Mild deficits observed, not formally tested (does not use walker at baseline, but more confident and stable with AD)                                           Pertinent Vitals/Pain Pain Assessment: No/denies pain (has been having pain at catheter site)  Home Living Family/patient expects to be discharged to:: Private residence Living Arrangements: Spouse/significant other Available Help at Discharge: Available 24 hours/day   Home Access: Stairs to enter Entrance Stairs-Rails: None Entrance Stairs-Number of Steps: 2 Home Layout: Able to live on main level with bedroom/bathroom Home Equipment: None      Prior Function Level of Independence: Needs assistance   Gait / Transfers Assistance Needed: Pt typically has been able to do in-home ambulation w/o AD, has not been getting out of the home as much in the last few months  ADL's / [place] Needed: wife manages most of  homemaking, does need to assist him at times with dressing/bathing, more recently she has been having trouble keeping up with indwelling cath        Hand Dominance        Extremity/Trunk Assessment   Upper Extremity Assessment Upper Extremity Assessment: Generalized weakness    Lower Extremity Assessment Lower Extremity Assessment: Generalized weakness       Communication   Communication: No difficulties;HOH  Cognition Arousal/Alertness: Awake/alert Behavior During Therapy: WFL for tasks assessed/performed Overall Cognitive Status: Impaired/Different from baseline                                 General Comments: Pt aware of situaiton, knew birthday was this weekend, but did have some minimal issues with processing cues/instruction and wife confirms that he is not at his baseline, per wife likely associated with UTI      General Comments      Exercises     Assessment/Plan    PT Assessment Patient needs continued PT services  PT Problem List Decreased strength;Decreased range of motion;Decreased activity tolerance;Decreased balance;Decreased mobility;Decreased coordination;Decreased knowledge of use of DME;Decreased safety awareness;Decreased cognition;Cardiopulmonary status limiting activity       PT Treatment Interventions Gait training;Stair training;DME instruction;Functional mobility training;Therapeutic activities;Therapeutic exercise;Balance training;Neuromuscular re-education;Patient/family education    PT Goals (Current goals can be found in the Care Plan section)  Acute Rehab PT Goals Patient Stated Goal: get stonger and more stable PT Goal Formulation: With patient Time For Goal Achievement: 03/18/21 Potential to Achieve Goals: Good    Frequency Min 2X/week   Barriers to discharge        Co-evaluation               AM-PAC PT "6 Clicks" Mobility  Outcome Measure Help needed turning from your back to your side while in a flat  bed without using bedrails?: None Help needed moving from lying on your back to sitting on the side of a flat bed without using bedrails?: A Little Help needed moving to and from a bed to a chair (including a wheelchair)?: A Little Help needed standing up from a chair using your arms (e.g., wheelchair or bedside chair)?: A Little Help needed to walk in hospital room?: A Little Help needed climbing 3-5 steps with a railing? : A Little 6 Click Score: 19    End of Session Equipment Utilized During Treatment: Gait belt Activity Tolerance: Patient tolerated treatment well Patient left: with call bell/phone within reach;with chair alarm set;with family/visitor present Nurse Communication: Mobility status PT Visit Diagnosis: Muscle weakness (generalized) (M62.81);Difficulty in walking, not elsewhere classified (R26.2)    Time: 4098-1191 PT Time Calculation (min) (ACUTE ONLY): 41 min   Charges:   PT Evaluation $PT Eval Low Complexity: 1 Low PT Treatments $Gait Training: 8-22 mins $Therapeutic Activity: 8-22  mins        Kreg Shropshire, DPT 03/04/2021, 3:00 PM

## 2021-03-04 NOTE — Progress Notes (Addendum)
Met with the patient and his wife in the room, they have transportation, [facility] is [facility] with [facility], He need a RW and a 3 in 1, that wil be brought into the room prior to DC by [employer], spoke with Suanne Marker and notified He can afford his medications, provided he and his wife with a list of PCS agencies/companies

## 2021-03-04 NOTE — Care Management Important Message (Signed)
Important Message  Patient Details  Name: Jonathon Welch MRN: [id number] Date of Birth: Aug 17, 1936   Medicare Important Message Given:  Yes     Juliann Pulse A Jatasia Gundrum 03/04/2021, 3:12 PM

## 2021-03-04 NOTE — Evaluation (Signed)
Occupational Therapy Evaluation Patient Details Name: Jonathon Welch MRN: [id number] DOB: [birth year] Today's Date: 03/04/2021    History of Present Illness 85 y.o. male with medical history significant for Chronic A. fib and history of aortic valve replacement on anticoagulation, Parkinson's disease, HTN,CKD lllb and BPH who recently had an indwelling Foley catheter for 3 weeks, removed 5/16 at urology office.  He came to the hospital on 5/16 with complaints of generalized weakness and shaking chills.  Temperature was 102.6.  He was found to have profound elevation of procalcitonin level with abnormal urine.  He was diagnosed with acute urinary tract infection with sepsis.  He also had Foley catheter anchored again due to urinary retention.   Clinical Impression   Patient presenting with decreased I in self care, balance, functional mobility/transfers, endurance, and safety awareness.  Patient and wife present in the room and reports pt ambulating without use of AD PTA. Pt can normally dressing and wash himself but occasionally needs min A from wife with fine motor tasks(buttoning). Patient currently functioning at min guard - min A with use of RW for self care tasks and functional transfers/standing balance. Pt needing cuing for advancement and management of RW this session. Pt with LOB posteriorly with head turns needing min A to correct. Pt is very pleasant and agreeable to OT intervention. OT recommended use of RW and 3 in 1 commode for home to place over toilet or in shower for increased safety with tasks.  Patient will benefit from acute OT to increase overall independence in the areas of ADLs, functional mobility, and safety awareness in order to safely discharge home with family.    Follow Up Recommendations  Supervision/Assistance - 24 hour;Home health OT    Equipment Recommendations  3 in 1 bedside commode;Other (comment) (RW)       Precautions / Restrictions Precautions Precautions:  Fall Restrictions Weight Bearing Restrictions: No      Mobility Bed Mobility Overal bed mobility: Needs Assistance Bed Mobility: Supine to Sit     Supine to sit: Min guard     General bed mobility comments: Pt was able to get to EOB w/o heavy assist, but did have some retropulsion needing guidance to scoot to EOB and maintain upright posture    Transfers Overall transfer level: Needs assistance Equipment used: Rolling walker (2 wheeled) Transfers: Sit to/from Stand Sit to Stand: Min assist         General transfer comment: Initial attempt to stand w/o assist and from standard height bed was unsuccessful despite cues for hand placement.  Raised bed ~2" and gave very light CGA cuing to keep weight forward and pt was able to rise w/o issue    Balance Overall balance assessment: Mild deficits observed, not formally tested                                         ADL either performed or assessed with clinical judgement   ADL Overall ADL's : Needs assistance/impaired     Grooming: Wash/dry hands;Wash/dry face;Oral care;Standing;Min guard                   Toilet Transfer: Designer, television/film set Details (indicate cue type and reason): simulated                 Vision Patient Visual Report: No change from baseline  Pertinent Vitals/Pain Pain Assessment: No/denies pain     Hand Dominance Right   Extremity/Trunk Assessment Upper Extremity Assessment Upper Extremity Assessment: Generalized weakness   Lower Extremity Assessment Lower Extremity Assessment: Generalized weakness       Communication Communication Communication: No difficulties;HOH   Cognition Arousal/Alertness: Awake/alert Behavior During Therapy: WFL for tasks assessed/performed Overall Cognitive Status: Impaired/Different from baseline                                 General Comments: increased time to process information with min  cuing for safety awareness and technique.              Home Living Family/patient expects to be discharged to:: Private residence Living Arrangements: Spouse/significant other Available Help at Discharge: Available 24 hours/day Type of Home: House Home Access: Stairs to enter [employer] of Steps: 2 Entrance Stairs-Rails: None Home Layout: Able to live on main level with bedroom/bathroom     Bathroom Shower/Tub: Walk-in shower         Home Equipment: Grab bars - toilet;Grab bars - tub/shower          Prior Functioning/Environment Level of Independence: Needs assistance  Gait / Transfers Assistance Needed: Pt typically has been able to do in-home ambulation w/o AD, has not been getting out of the home as much in the last few months ADL's / Homemaking Assistance Needed: wife manages most of homemaking, does need to assist him at times with dressing/bathing, more recently she has been having trouble keeping up with indwelling cath            OT Problem List: Decreased strength;Impaired balance (sitting and/or standing);Decreased cognition;Decreased safety awareness;Decreased activity tolerance;Decreased knowledge of use of DME or AE      OT Treatment/Interventions: Self-care/ADL training;Manual therapy;Therapeutic exercise;Patient/family education;Neuromuscular education;Balance training;Energy conservation;Therapeutic activities;Cognitive remediation/compensation;DME and/or AE instruction    OT Goals(Current goals can be found in the care plan section) Acute Rehab OT Goals Patient Stated Goal: get stonger and more stable OT Goal Formulation: With patient/family Time For Goal Achievement: 03/18/21 Potential to Achieve Goals: Fair ADL Goals Pt Will Perform Grooming: with modified independence Pt Will Perform Lower Body Dressing: with supervision;sit to/from stand Pt Will Transfer to Toilet: with supervision;ambulating Pt Will Perform Toileting - Clothing  Manipulation and hygiene: with supervision;sit to/from stand Pt/caregiver will Perform Home Exercise Program: Both right and left upper extremity;Increased strength;With written HEP provided;With theraband;With Supervision  OT Frequency: Min 2X/week   Barriers to D/C:    none known at this time          AM-PAC OT "6 Clicks" Daily Activity     Outcome Measure Help from another person eating meals?: A Little Help from another person taking care of personal grooming?: A Little Help from another person toileting, which includes using toliet, bedpan, or urinal?: A Little Help from another person bathing (including washing, rinsing, drying)?: A Little Help from another person to put on and taking off regular upper body clothing?: A Little Help from another person to put on and taking off regular lower body clothing?: A Little 6 Click Score: 18   End of Session Equipment Utilized During Treatment: Rolling walker Nurse Communication: Mobility status  Activity Tolerance: Patient tolerated treatment well Patient left: with call bell/phone within reach;with bed alarm set;in bed  OT Visit Diagnosis: Unsteadiness on feet (R26.81);Muscle weakness (generalized) (M62.81)  Time: 9373-4287 OT Time Calculation (min): 34 min Charges:  OT General Charges $OT Visit: 1 Visit OT Evaluation $OT Eval Low Complexity: 1 Low OT Treatments $Self Care/Home Management : 8-22 mins $Therapeutic Activity: 8-22 mins  Darleen Crocker, MS, OTR/L , CBIS ascom [phone]  03/04/21, 4:07 PM

## 2021-03-05 LAB — URINALYSIS, COMPLETE
Bilirubin, UA: NEGATIVE
Glucose, UA: NEGATIVE
Nitrite, UA: NEGATIVE
Specific Gravity, UA: 1.015 (ref 1.005–1.030)
Urobilinogen, Ur: 1 mg/dL (ref 0.2–1.0)
pH, UA: 5.5 (ref 5.0–7.5)

## 2021-03-05 LAB — BASIC METABOLIC PANEL
Anion gap: 6 (ref 5–15)
BUN: 29 mg/dL — ABNORMAL HIGH (ref 8–23)
CO2: 24 mmol/L (ref 22–32)
Calcium: 8.7 mg/dL — ABNORMAL LOW (ref 8.9–10.3)
Chloride: 107 mmol/L (ref 98–111)
Creatinine, Ser: 1.11 mg/dL (ref 0.61–1.24)
GFR, Estimated: >60 mL/min (ref >60)
Glucose, Bld: 129 mg/dL — ABNORMAL HIGH (ref 70–99)
Potassium: 4.1 mmol/L (ref 3.5–5.1)
Sodium: 137 mmol/L (ref 135–145)

## 2021-03-05 LAB — CBC
HCT: 41 % (ref 39.0–52.0)
Hemoglobin: 13.3 g/dL (ref 13.0–17.0)
MCH: 28.3 pg (ref 26.0–34.0)
MCHC: 32.4 g/dL (ref 30.0–36.0)
MCV: 87.2 fL (ref 80.0–100.0)
Platelets: 184 10*3/uL (ref 150–400)
RBC: 4.7 MIL/uL (ref 4.22–5.81)
RDW: 14.2 % (ref 11.5–15.5)
WBC: 7.5 10*3/uL (ref 4.0–10.5)
nRBC: 0 % (ref 0.0–0.2)

## 2021-03-05 LAB — PROCALCITONIN: Procalcitonin: 32.99 ng/mL

## 2021-03-05 LAB — MICROSCOPIC EXAMINATION: WBC, UA: >30 /hpf — ABNORMAL HIGH (ref 0–5)

## 2021-03-05 LAB — MAGNESIUM: Magnesium: 2.1 mg/dL (ref 1.7–2.4)

## 2021-03-05 MED ORDER — CEFTRIAXONE IV (FOR PTA / DISCHARGE USE ONLY): 2.0000 g | INTRAVENOUS | 0 refills | Status: AC

## 2021-03-05 MED ORDER — HYDRALAZINE HCL 20 MG/ML IJ SOLN
10.0000 mg | Freq: Four times a day (QID) | INTRAMUSCULAR | Status: DC | PRN
  Administered 2021-03-05: 10 mg via INTRAVENOUS
  Filled 2021-03-05: qty 1

## 2021-03-05 NOTE — Plan of Care (Signed)

## 2021-03-05 NOTE — Progress Notes (Addendum)
PHARMACY CONSULT NOTE FOR:  OUTPATIENT  PARENTERAL ANTIBIOTIC THERAPY (OPAT)  Indication: citrobacter bacteremia from complicated UTI without PO antibiotic option (failed po cephalexin, avoid quinolones due to history of AAA, avoid TMP/SMZ d/t advanced CKD) Regimen: ceftriaxone 2gm IV q24h End date: 03/15/2021  Remove IV access (PICC or midline) upon completed of IV antibiotics on 03/15/2021  IV antibiotic discharge orders are pended. To discharging provider:  please sign these orders via discharge navigator,  Select New Orders & click on the button choice - Manage This Unsigned Work.    Thank you for allowing pharmacy to be a part of this patient's care.  Doreene Eland, PharmD, BCPS.   Work Cell: [phone] 03/05/2021 9:25 AM

## 2021-03-05 NOTE — Progress Notes (Signed)
Order for midline placed.  IVT spoke with primary RN, Georgina Peer and informed her that IVT who can place midlines is not currently on this campus.  RN will make MD aware.

## 2021-03-05 NOTE — TOC Progression Note (Addendum)
Transition of Care [facility]) - Progression Note    Patient Details  Name: Jonathon Welch MRN: [id number] Date of Birth: [birth year]  Transition of Care [facility]) CM/SW [place], LCSW Phone Number: 03/05/2021, 9:21 AM  Clinical Narrative:   Notified by MD and Pharmacist that patient will DC home today with IV Rocephin. Notified Corene Cornea with [place] and Dorian Pod with [facility]. Pharmacist Rachel Bo has also been in contact with Dorian Pod.   10:10- Leslie Dales with [facility] with referral info. She stated patient should not DC until she notifies CSW that everything is in place for IV meds. CSW notified care team. Dorian Pod aware of plan for DC today 03/05/21. Per RN, patient is also waiting to get her midline placed.   12:00- Call from [place]. She has confirmed everything with patient's son [name] and everything is in place on their end for home IV Rocephin. Informed her of plan for patient to DC  today after midline. She verbalized agreement.   12:35- Call from [place]. [facility] is unable to start RN services until Sunday. [facility] is not able to come do bedside teaching today. Dorian Pod is trying to see if [facility] or [facility] can do patient's start of care Saturday then [place] resume on Sunday. She will call CSW when this is confirmed. She stated if they cannot arrange this, patient would not be able to DC until he gets his dose tomorrow at the hospital. She has updated [facility] and patient's son. Updated care team not to DC patient until CSW hears back from [place].   1:40- Reached out for update, Dorian Pod waiting to hear from [place].   2:10- Reached out for update.   2:25- Per Dorian Pod, they have nursing coverage for 3pm tomorrow start of care so patient can DC today. Notified care team.  Expected Discharge Plan: [place] Barriers to Discharge: Continued Medical Work up  Expected Discharge Plan and  Services Expected Discharge Plan: [place]   Discharge Planning Services: CM Consult   Living arrangements for the past 2 months: Single Family Home                 DME Arranged: Gilford Rile rolling,3-N-1 DME Agency: [employer] Date DME Agency Contacted: 03/04/21 Time DME Agency Contacted: [phone] Representative spoke with at DME Agency: Suanne Marker Reid: PT [place]: [place] ([place]) Date [place]: 03/04/21 Time [place]: [place] Representative spoke with at [place]: [place] ([place]) Interventions    Readmission Risk Interventions No flowsheet data found.

## 2021-03-05 NOTE — Discharge Summary (Signed)
Physician Discharge Summary  Patient ID: Jonathon Welch MRN: [id number] DOB/AGE: [birth year] 85 y.o.  Admit date: 03/01/2021 Discharge date: 03/05/2021  Admission Diagnoses:  Discharge Diagnoses:  Principal Problem:   Sepsis secondary to UTI [facility]) Active Problems:   Chronic atrial fibrillation ([place])   Parkinson's disease ([place])   H/O aortic valve replacement   Hypertension   Complicated UTI (urinary tract infection)   Chronic anticoagulation   Discharged Condition: good  Hospital Course:  GOTHAM RADEN a 85 y.o.malewith medical history significant forChronic A. fib and history of aortic valve replacement on anticoagulation, Parkinson's disease, HTN,CKD lllb and BPH who recently had an indwelling Foley catheter for 3 weeks, removed5/16at urology office.He came to the hospital on 5/16with complaints of generalized weakness and shaking chills. Temperature was 102.6. He was found to have profound elevation of procalcitonin level with abnormal urine. He was diagnosed with acute urinary tract infection with sepsis. He also had Foley catheter anchored again due to urinary retention. He is started on Rocephin.  Blood culture and urine culture both positive for Citrobacter.  Repeat culture performed on 5/19  #1.  Sepsis with Citrobacter septicemia secondary to UTI. Complicated Citrobacter UTI secondary to Foley catheter. Benign prostate hypertrophy with urinary retention. Patient appears to be doing better.  Patient has significant urinary retention, will continue Foley catheter.  Discussed with patient and son, patient will need to follow-up with urology in 2 weeks. Blood culture and urine culture both growing Citrobacter, however, Cipro is not a viable option as patient had a aortic aneurysm.  Since patient has bacteremia, will opt to treat with IV antibiotics for additional 10 days with Rocephin.  Midline will be placed before discharge, please remove midline when antibiotics  completed.   Patient also evaluated by physical therapy/Occupational Therapy, recommend home with home care.    2.   Acute kidney injury. POA Metabolic acidosis. Initially thought patient has chronic kidney disease stage IIIb, but renal function normalized after giving fluids.  Apparently patient had acute kidney injury.  3. Chronic atrial fibrillation  Continue Eliquis  4.  Parkinson disease. Appears to be stable.   Consults: None  Significant Diagnostic Studies: PORTABLE CHEST 1 VIEW  COMPARISON:  None.  FINDINGS: Mildly decreased lung volumes are seen which is likely secondary to the degree of patient inspiration. Mild, diffuse, chronic appearing increased interstitial lung markings are noted. Very mild scarring and/or atelectasis is seen within the left lung base. There is mild elevation of the right hemidiaphragm. There is no evidence of a pleural effusion or pneumothorax. The cardiac silhouette is mildly enlarged. Moderate severity calcification of the aortic arch is noted. The visualized skeletal structures are unremarkable.  IMPRESSION: Chronic appearing increased interstitial lung markings with very mild left basilar scarring and/or atelectasis.   Electronically Signed   By: Virgina Norfolk M.D.   On: 03/01/2021 22:06   Treatments: Rocephin and IV fluids.  Discharge Exam: Blood pressure (!) 155/70, pulse 61, temperature (!) 97.4 F (36.3 C), resp. rate 16, height 5' 5"  (1.651 m), weight 70.3 kg, SpO2 97 %. General appearance: alert and cooperative Resp: clear to auscultation bilaterally Cardio: regular rate and rhythm, S1, S2 normal, no murmur, click, rub or gallop GI: soft, non-tender; bowel sounds normal; no masses,  no organomegaly Extremities: extremities normal, atraumatic, no cyanosis or edema  Disposition: Discharge disposition: 01-Home or Self Care       Discharge Instructions    [facility] pharmacist to adjust dose  for Vancomycin, Aminoglycosides  and other anti-infective therapies as requested by physician.   Complete by: As directed    [facility] to provide Cath Flo 40m   Complete by: As directed    Administer for PICC line occlusion and as ordered by physician for other access device issues.   Anaphylaxis Kit: Provided to treat any anaphylactic reaction to the medication being provided to the patient if First Dose or when requested by physician   Complete by: As directed    Epinephrine 172mml vial / amp: Administer 0.106m40m0.106ml28mubcutaneously once for moderate to severe anaphylaxis, nurse to call physician and pharmacy when reaction occurs and call 911 if needed for immediate care   Diphenhydramine 50mg52mIV vial: Administer 25-50mg 64mM PRN for first dose reaction, rash, itching, mild reaction, nurse to call physician and pharmacy when reaction occurs   Sodium Chloride 0.9% NS 500ml I61mdminister if needed for hypovolemic blood pressure drop or as ordered by physician after call to physician with anaphylactic reaction   Change dressing on IV access line weekly and PRN   Complete by: As directed    Diet - low sodium heart healthy   Complete by: As directed    Flush IV access with Sodium Chloride 0.9% and Heparin 10 units/ml or 100 units/ml   Complete by: As directed    Home infusion instructions - [facility]   Complete by: As directed    Instructions: Flush IV access with Sodium Chloride 0.9% and Heparin 10units/ml or 100units/ml   Change dressing on IV access line: Weekly and PRN   Instructions Cath Flo 2mg: Ad4106mister for PICC Line occlusion and as ordered by physician for other access device   [facility] pharmacist to adjust dose for: Vancomycin, Aminoglycosides and other anti-infective therapies as requested by physician   Increase activity slowly   Complete by: As directed    Method of administration may be changed at the discretion of home infusion  pharmacist based upon assessment of the patient and/or caregiver's ability to self-administer the medication ordered   Complete by: As directed      Allergies as of 03/05/2021      Reactions   Peanut-containing Drug Products Other (See Comments)   Open sores inside of mouth previously. Stays away from peanuts now      Medication List    STOP taking these medications   warfarin 5 MG tablet Commonly known as: COUMADIN     TAKE these medications   acetaminophen 500 MG tablet Commonly known as: TYLENOL Take 500 mg by mouth daily as needed for moderate pain or headache.   amLODipine 2.5 MG tablet Commonly known as: NORVASC Take by mouth.   apixaban 2.5 MG Tabs tablet Commonly known as: ELIQUIS Take 2.5 mg by mouth 2 (two) times daily.   carbidopa-levodopa 50-200 MG tablet Commonly known as: SINEMET CR Take 1 tablet by mouth at bedtime.   carbidopa-levodopa 25-100 MG tablet Commonly known as: SINEMET IR Take 1.5 tablets by mouth 3 (three) times daily.   cefTRIAXone  IVPB Commonly known as: ROCEPHIN Inject 2 g into the vein daily for 10 days. Indication: Citrobacter bacteremia and complicated UTI First Dose: Yes Last Day of Therapy:  03/15/2021 Labs - Once 03/10/2021 :  CBC/D and BMP Method of administration: IV Push Remove IV access (PICC or Midline) upon completion of antibiotics on 03/15/2021 Method of administration may be changed at the discretion of home infusion pharmacist based upon assessment of the patient and/or caregiver's ability  to self-administer the medication ordered.   Coenzyme Q10 400 MG Caps Take by mouth.   ferrous gluconate 324 MG tablet Commonly known as: FERGON Take 324 mg by mouth daily with breakfast.   furosemide 20 MG tablet Commonly known as: LASIX Take 40 mg by mouth daily.   metolazone 2.5 MG tablet Commonly known as: ZAROXOLYN Take 2.5 mg by mouth as needed.   mirtazapine 7.5 MG tablet Commonly known as: REMERON Take 7.5 mg by  mouth at bedtime.   selegiline 5 MG capsule Commonly known as: ELDEPRYL Take 5 mg by mouth 2 (two) times daily.   silodosin 4 MG Caps capsule Commonly known as: RAPAFLO Take 1 capsule (4 mg total) by mouth daily with breakfast.   Vitamin D 50 MCG (2000 UT) tablet Take 2,000 Units by mouth daily.            Durable Medical Equipment  (From admission, onward)         Start     Ordered   03/04/21 1511  For home use only DME Walker rolling  Once       Question Answer Comment  Walker: With 5 Inch Wheels   Patient needs a walker to treat with the following condition Weakness      03/04/21 1511           Discharge Care Instructions  (From admission, onward)         Start     Ordered   03/05/21 0000  Change dressing on IV access line weekly and PRN  (Home infusion instructions - [facility] )        03/05/21 1031          Follow-up Information    Rusty Aus, MD Follow up in 1 week(s).   Specialty: Internal Medicine Contact information: [place] [address] [phone]              34 minutes Signed: Sharen Hones 03/05/2021, 10:31 AM

## 2021-03-05 NOTE — TOC Transition Note (Addendum)
Transition of Care [facility]) - CM/SW Discharge Note   Patient Details  Name: HILDRETH ROBART MRN: [id number] Date of Birth: 1936/10/16  Transition of Care [facility] CM/SW Contact:  Magnus Ivan, LCSW Phone Number: 03/05/2021, 2:29 PM   Clinical Narrative:   Patient is discharging home with [place] RN, PT. [facility] has arranged home IV meds. No other TOC needs identified prior to DC.    Final next level of care: [place] Barriers to Discharge: Barriers Resolved   Patient Goals and CMS Choice Patient states their goals for this hospitalization and ongoing recovery are:: get well      Discharge Placement                  Name of family member notified: son [name] aware Patient and family notified of of transfer: 03/05/21  Discharge Plan and Services   Discharge Planning Services: CM Consult            DME Arranged: Gilford Rile rolling,3-N-1,IV pump/equipment DME Agency: [employer] Date DME Agency Contacted: 03/04/21 Time DME Agency Contacted: [phone] Representative spoke with at DME Agency: Suanne Marker [place]: PT,RN [place]: [place] ([place]) Date [place]: 03/05/21 Time [place]: [place] Representative spoke with at [place]: [place] ([place]) Interventions     Readmission Risk Interventions No flowsheet data found.

## 2021-03-07 LAB — CULTURE, URINE COMPREHENSIVE

## 2021-03-08 ENCOUNTER — Telehealth: Payer: Self-pay

## 2021-03-08 NOTE — Telephone Encounter (Signed)
Please move up appointment for a cystoscopy before he finishes his antibiotics on 5/31.

## 2021-03-08 NOTE — Telephone Encounter (Signed)
Patient's wife called to let us know that after his V &T with our office on 03/01/21 he went to the ED that night and had cath replaced and was admitted for sepsis. He was discharged on Friday with a port and IV abx therapy to continue until 03/16/21, he still has a catheter in place. Patient has an apt with you to follow up on 03/19/21. Should he keep this appt or does it need to be moved up? Please advise

## 2021-03-09 LAB — CULTURE, BLOOD (ROUTINE X 2)
Culture: NO GROWTH
Culture: NO GROWTH
Special Requests: ADEQUATE
Special Requests: ADEQUATE

## 2021-03-09 NOTE — Telephone Encounter (Signed)
Pt has an appt for cysto w/[facility] tomorrow. Pt's son called yesterday asking about CT that Sam ordered. I'm assuming he still needs his CT, but according to [facility]'s note, Cysto needed to be moved up. I just wanted to confirm this before calling son back and letting him schedule CT for pt.

## 2021-03-10 ENCOUNTER — Other Ambulatory Visit: Payer: Medicare Other | Admitting: Urology

## 2021-03-10 ENCOUNTER — Ambulatory Visit: Payer: Medicare Other | Admitting: Urology

## 2021-03-10 ENCOUNTER — Encounter: Payer: Self-pay | Admitting: Urology

## 2021-03-10 ENCOUNTER — Other Ambulatory Visit: Payer: Self-pay

## 2021-03-10 VITALS — BP 180/76 | HR 64 | Ht 68.0 in | Wt 155.0 lb

## 2021-03-10 DIAGNOSIS — R339 Retention of urine, unspecified: Secondary | ICD-10-CM

## 2021-03-10 NOTE — Progress Notes (Signed)
   Cath Change/ Replacement  Patient is present today for a catheter change due to urinary retention.  7ml of water was removed from the balloon, a 16 cFR foley cath was removed with out difficulty.  Patient was cleaned and prepped in a sterile fashion with betadine. A 16 Coude FR foley cath was replaced into the bladder no complications were noted Urine return was noted 36ml and urine was yellow in color. The balloon was filled with 24ml of sterile water. A leg bag was attached for drainage.  A night bag was also given to the patient and patient was given instruction on how to change from one bag to another. Patient was given proper instruction on catheter care.    Performed by: Gaspar Cola CMA  Follow up: As scheduled

## 2021-03-10 NOTE — Progress Notes (Signed)
   03/10/21  CC:  Chief Complaint  Patient presents with  . Cysto    HPI: 85 y.o. male with recurrent urinary retention  I initially saw 02/15/2021 after an ED visit for urinary retention on 4/24  History recurrent bacteriuria/pyuria 2 months prior to ED visit  Voiding trial 5/2 remarkable for follow-up PVR 547 mL; catheter was replaced and he was started on low-dose silodosin  Repeat voiding trial 03/01/2021 with PVR 610 mL  Instructed CIC though only 100 mL of urine was obtained on initial catheterization  Presented to ED that evening with altered mental status and temp 102.6 and admitted.  Urine/blood cultures both + Citrobacter  Was discharged with an indwelling Foley  Blood pressure (!) 180/76, pulse 64, height 5\' 8"  (1.727 m), weight 155 lb (70.3 kg). NED. A&Ox3.   No respiratory distress   Abd soft, NT, ND Normal phallus with bilateral descended testicles  Cystoscopy Procedure Note  Patient identification was confirmed, informed consent was obtained, and patient was prepped using Betadine solution.  Lidocaine jelly was administered per urethral meatus.     Pre-Procedure: - Inspection reveals a normal caliber urethral meatus.  Procedure: The flexible cystoscope was introduced without difficulty - No urethral strictures/lesions are present. - Moderate lateral lobe enlargement prostate  - Moderate elevation bladder neck - Bilateral ureteral orifices identified - No gross solid or papillary lesions; inflammatory change bladder base secondary to indwelling Foley - No bladder stones - No trabeculation  Retroflexion shows no intravesical median lobe   Post-Procedure: - Patient tolerated the procedure well  Assessment/ Plan:  Discussed with Mr. Hanzlik and his son the 2 most common causes of urinary retention including outlet obstruction secondary to BPH and bladder hypotonicity.  The possibility of a combination of both was also discussed  He is scheduled for a  noncontrast CT next week and we will assess prostate volume and upper tract causes of chronic bacteriuria though I suspect his bacteriuria is secondary to a chronically elevated residual  Potential options were discussed including outlet procedure, chronic catheter drainage including SP tube and CIC   Abbie Sons, MD

## 2021-03-16 ENCOUNTER — Other Ambulatory Visit: Payer: Self-pay

## 2021-03-16 ENCOUNTER — Ambulatory Visit
Admission: RE | Admit: 2021-03-16 | Discharge: 2021-03-16 | Disposition: A | Discharge status: Home / Self Care | Payer: Medicare Other | Source: Ambulatory Visit | Attending: Physician Assistant | Admitting: Physician Assistant

## 2021-03-16 ENCOUNTER — Other Ambulatory Visit: Payer: Self-pay | Admitting: Physician Assistant

## 2021-03-16 DIAGNOSIS — R188 Other ascites: Secondary | ICD-10-CM | POA: Insufficient documentation

## 2021-03-16 DIAGNOSIS — R339 Retention of urine, unspecified: Secondary | ICD-10-CM

## 2021-03-17 ENCOUNTER — Other Ambulatory Visit: Payer: Self-pay | Admitting: Urology

## 2021-03-19 ENCOUNTER — Encounter: Payer: Self-pay | Admitting: Urology

## 2021-03-19 ENCOUNTER — Other Ambulatory Visit: Payer: Self-pay

## 2021-03-19 ENCOUNTER — Ambulatory Visit: Payer: Medicare Other | Admitting: Urology

## 2021-03-19 VITALS — BP 157/67 | HR 48 | Ht 68.0 in | Wt 155.0 lb

## 2021-03-19 DIAGNOSIS — N2889 Other specified disorders of kidney and ureter: Secondary | ICD-10-CM

## 2021-03-19 DIAGNOSIS — R339 Retention of urine, unspecified: Secondary | ICD-10-CM

## 2021-03-19 MED ORDER — FINASTERIDE 5 MG PO TABS: 5.0000 mg | ORAL_TABLET | Freq: Every day | ORAL | 3 refills | Status: AC

## 2021-03-19 NOTE — Progress Notes (Signed)
03/19/2021 10:15 AM   Jonathon Welch 02/14/1936 [id number]  Referring provider: Rusty Aus, MD [facility] [place],  [place] [address]  Chief Complaint  Patient presents with  . Other    HPI: 85 y.o. male presents for follow-up of recent CT.   Refer to my previous cystoscopy note 03/10/2021  CT with multiple bilateral renal cysts largest right lower pole 11 cm; 3.9 cm left lower pole renal mass felt suspicious for renal cell carcinoma; calcification overlying left kidney and difficult to tell if this is an intraparenchymal calculus or calcification of a cyst wall; prostate enlarged with calculated volume 58 g   PMH: Past Medical History:  Diagnosis Date  . Anemia    vitamin b12 deficiency  . Aortic valve disease   . Atrial fibrillation ([place])   . Cancer ([place])    basal and squamous cell skin cancer  . DDD (degenerative disc disease), lumbar 07/25/2016  . Depression   . Dysrhythmia    atrial fibrillation  . Herniation through surgical site   . Hyperlipidemia   . Hypertension   . Left inguinal hernia 2019  . Migraine headache    less frequent lately  . Parkinson disease ([place])   . Polycystic kidney disease   . Right inguinal hernia   . Tremor   . Ventral hernia 2019    Surgical History: Past Surgical History:  Procedure Laterality Date  . AORTIC AND MITRAL VALVE REPLACEMENT N/A   . APPENDECTOMY    . ASCENDING AORTIC ANEURYSM REPAIR W/ MECHANICAL AORTIC VALVE REPLACEMENT  1985   aneurysm repaired when replaced "blown out" tissue valve  . CARDIAC VALVE REPLACEMENT  1980 and 1985   aortic valve replaced, mechanical valve  . CORONARY ARTERY BYPASS GRAFT  1980 and 1985   aortic valve replaced not CABG  . MOHS SURGERY     nose and ear  . ROBOTIC ASSISTED LAPAROSCOPIC VENTRAL/INCISIONAL HERNIA REPAIR Right 04/06/2018   Procedure: ROBOTIC ASSISTED LAPAROSCOPIC VENTRAL/INCISIONAL HERNIA REPAIR AND RIGHT INGUINAL HERNIA;   Surgeon: Jules Husbands, MD;  Location: [facility] ORS;  Service: General;  Laterality: Right;    Home Medications:  Allergies as of 03/19/2021      Reactions   Peanut-containing Drug Products Other (See Comments)   Open sores inside of mouth previously. Stays away from peanuts now      Medication List       Accurate as of March 19, 2021 10:15 AM. If you have any questions, ask your nurse or doctor.        STOP taking these medications   Coenzyme Q10 400 MG Caps Stopped by: Abbie Sons, MD   Vitamin D 50 MCG (2000 UT) tablet Stopped by: Abbie Sons, MD     TAKE these medications   acetaminophen 500 MG tablet Commonly known as: TYLENOL Take 500 mg by mouth daily as needed for moderate pain or headache.   amLODipine 2.5 MG tablet Commonly known as: NORVASC Take by mouth.   apixaban 2.5 MG Tabs tablet Commonly known as: ELIQUIS Take 2.5 mg by mouth 2 (two) times daily.   carbidopa-levodopa 50-200 MG tablet Commonly known as: SINEMET CR Take 1 tablet by mouth at bedtime.   carbidopa-levodopa 25-100 MG tablet Commonly known as: SINEMET IR Take 1.5 tablets by mouth 3 (three) times daily.   ferrous gluconate 324 MG tablet Commonly known as: FERGON Take 324 mg by mouth daily with breakfast.   finasteride 5  MG tablet Commonly known as: PROSCAR Take 1 tablet (5 mg total) by mouth daily. Started by: Abbie Sons, MD   furosemide 20 MG tablet Commonly known as: LASIX Take 40 mg by mouth daily.   metolazone 2.5 MG tablet Commonly known as: ZAROXOLYN Take 2.5 mg by mouth as needed.   mirtazapine 7.5 MG tablet Commonly known as: REMERON Take 7.5 mg by mouth at bedtime.   omeprazole 40 MG capsule Commonly known as: PRILOSEC Take 1 capsule by mouth daily.   selegiline 5 MG capsule Commonly known as: ELDEPRYL Take 5 mg by mouth 2 (two) times daily.   silodosin 4 MG Caps capsule Commonly known as: RAPAFLO TAKE 1 CAPSULE BY MOUTH ONCE DAILY WITH BREAKFAST        Allergies:  Allergies  Allergen Reactions  . Peanut-Containing Drug Products Other (See Comments)    Open sores inside of mouth previously. Stays away from peanuts now    Family History: Family History  Problem Relation Age of Onset  . Heart disease Mother   . Hypertension Mother   . Stroke Father   . Kidney disease Sister     Social History:  reports that he quit smoking about 51 years ago. He has a 22.00 pack-year smoking history. He has never used smokeless tobacco. He reports that he does not drink alcohol and does not use drugs.   Physical Exam: BP (!) 157/67   Pulse (!) 48   Ht 5\' 8"  (1.727 m)   Wt 155 lb (70.3 kg)   BMI 23.57 kg/m   Constitutional:  Alert, No acute distress. HEENT: [place] AT, moist mucus membranes.  Trachea midline, no masses. Cardiovascular: No clubbing, cyanosis, or edema. Respiratory: Normal respiratory effort, no increased work of breathing.   Pertinent Imaging: CT images were personally reviewed and interpreted by my measurements the mass is 3.5 x 3.6 x 4.5.  Radiology report mentions 3.69 but in the impression states 5.1 cm   CT RENAL STONE STUDY  Narrative CLINICAL DATA:  Chronic UTI  EXAM: CT ABDOMEN AND PELVIS WITHOUT CONTRAST  TECHNIQUE: Multidetector CT imaging of the abdomen and pelvis was performed following the standard protocol without IV contrast.  COMPARISON:  09/28/2005  FINDINGS: Lower chest: Mild cardiomegaly. No pericardial effusion. Heavy mitral annulus calcifications. Coronary calcifications. Trace pleural fluid. Linear scarring or subsegmental atelectasis posteriorly in the lung bases. Sternotomy wire.  Hepatobiliary: Multiple cystic lesions mostly in the left lobe, with interval increase in size since prior study. Multiple partially calcified stones measuring up to 1.1 cm in the nondilated gallbladder. No biliary ductal dilatation.  Pancreas: 2.1 cystic lesion in pancreatic head, previously 1.5 cm. No  ductal dilatation or adjacent inflammatory change.  Spleen: Normal in size without focal abnormality.  Adrenals/Urinary Tract: Adrenal glands unremarkable. Nephromegaly secondary to interval progression in size of innumerable renal cysts, largest 11 cm from the right lower pole extending into the pelvis. 3.9 cm left lower pole renal mass, new since previous. Stable 4 mm calcification adjacent to the left renal collecting system. Progressive curvilinear calcifications in the lower pole right kidney. The urinary bladder is decompressed by Foley catheter  Stomach/Bowel: Stomach is nondistended. The small bowel is nondilated. The colon is nondilated, unremarkable.  Vascular/Lymphatic: Moderate scattered aortoiliac calcified plaque without aneurysm. No definite abdominal or pelvic adenopathy.  Reproductive: Prostate enlargement.  Other: No ascites.  No free air.  Musculoskeletal: Ventral hernia containing mesenteric fat and incompletely marginated 5.5 cm fluid collection. Multilevel lumbar spondylitic change. Sternotomy  wire. No fracture or worrisome bone lesion.  IMPRESSION: 1. 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 2. 5.1 cm left lower pole renal mass, new since previous, suspicious for renal cell carcinoma. Consider renal MR with without contrast for further characterization. 3. Coronary and aortoiliac  atherosclerosis (ICD10-170.0).   Electronically Signed By: Lucrezia Europe M.D. On: 03/16/2021 16:00   Assessment & Plan:    1.  Urinary retention  Failed last voiding trial and was admitted as an inpatient for sepsis  We discussed outlet procedures however would recommend a urodynamic study to assess bladder function before proceeding due to multiple comorbidities  We also discussed the option of an SP tube which they would like to proceed with initially  He has some unsteadiness with silodosin and will discontinue and start finasteride 5 mg daily  If SP  tube drainage is continued voiding trial after he has been on finasteride for-6 months  2.  Renal mass  New finding  We discussed the mass is suspicious for renal cell carcinoma though this was a noncontrast study  He has an artificial heart valve and will need to determine if this is MRI compatible  Based on his significant comorbidities feel surveillance would be the best option   Abbie Sons, MD  [facility] [address], [place], [place] [address] [phone]

## 2021-03-21 DIAGNOSIS — N2889 Other specified disorders of kidney and ureter: Secondary | ICD-10-CM | POA: Insufficient documentation

## 2021-03-23 ENCOUNTER — Encounter: Payer: Self-pay | Admitting: Urology

## 2021-03-25 ENCOUNTER — Telehealth: Payer: Self-pay

## 2021-03-25 DIAGNOSIS — R339 Retention of urine, unspecified: Secondary | ICD-10-CM

## 2021-03-25 NOTE — Telephone Encounter (Signed)
-----   Message from Abbie Sons, MD sent at 03/24/2021  7:02 AM EDT ----- Regarding: SP tube-IR Please schedule percutaneous SP tube placement with IR.  Diagnosis urinary retention.  Thanks

## 2021-03-25 NOTE — Telephone Encounter (Signed)
Order placed and faxed, awaiting IR scheduling response. Patient is on Eliquis will need clearance -stop 48hours prior Faxing to Dr. Ammie Ferrier office awaiting response

## 2021-03-26 NOTE — Telephone Encounter (Signed)
Clearance received from Dr. Ammie Ferrier office ok for patient to stop Eliquis 48 hours prior to procedure

## 2021-03-26 NOTE — Telephone Encounter (Signed)
Per Pamala Hurry in IR scheduling patient is scheduled Thurs 04/01/21 @ 10:30am Arrive @ 9:30am  Patient and wife notified, instructions reviewed to stop Eliquis 48 hours prior and to be NPO from midnight on.

## 2021-03-29 ENCOUNTER — Ambulatory Visit (INDEPENDENT_AMBULATORY_CARE_PROVIDER_SITE_OTHER): Payer: Medicare Other

## 2021-03-29 ENCOUNTER — Other Ambulatory Visit: Payer: Self-pay

## 2021-03-29 DIAGNOSIS — R339 Retention of urine, unspecified: Secondary | ICD-10-CM | POA: Diagnosis not present

## 2021-03-29 NOTE — Progress Notes (Signed)
Patient present to office today complaining of stat lock becoming loose from leg. Advised patient to adjust leg straps higher on thigh to help with comfort and avoid pulling on cath tubing. Demonstration was given to patient and Son, who was present at today's visit. A new Velcro leg strap with cath attachment was given and placed on patient's leg with demonstration.   Upcoming SPT placement was discussed and patient has a preference of cath bags due to his parkinson's, he is not able to use the twist lock. A extender tube with luer lock adjustment was given for them to take to insertion appointment if needed for the new tubing to ensure he can use his bag preference.

## 2021-03-29 NOTE — Progress Notes (Signed)
Patient on schedule for SPT placement on 04/01/2021, called and spoke with wife on phone with pre procedure instructions given. Made aware to be here @ 0930, NPO after MN prior to procedure and driver post procedure/recovery/discharge. Holding Eliquis after this pm 6/13 dose for procedure.stated understanding.

## 2021-03-30 ENCOUNTER — Other Ambulatory Visit: Payer: Self-pay | Admitting: Student

## 2021-03-31 ENCOUNTER — Other Ambulatory Visit: Payer: Self-pay | Admitting: Radiology

## 2021-04-01 ENCOUNTER — Other Ambulatory Visit: Payer: Self-pay

## 2021-04-01 ENCOUNTER — Emergency Department: Payer: Medicare Other

## 2021-04-01 ENCOUNTER — Emergency Department
Admission: EM | Admit: 2021-04-01 | Discharge: 2021-04-01 | Disposition: A | Discharge status: Home / Self Care | Payer: Medicare Other | Attending: Emergency Medicine | Admitting: Emergency Medicine

## 2021-04-01 ENCOUNTER — Emergency Department
Admission: RE | Admit: 2021-04-01 | Discharge: 2021-04-01 | Disposition: A | Discharge status: Home / Self Care | Payer: Medicare Other | Source: Ambulatory Visit | Attending: Urology | Admitting: Urology

## 2021-04-01 DIAGNOSIS — Z7901 Long term (current) use of anticoagulants: Secondary | ICD-10-CM | POA: Insufficient documentation

## 2021-04-01 DIAGNOSIS — Z951 Presence of aortocoronary bypass graft: Secondary | ICD-10-CM | POA: Diagnosis not present

## 2021-04-01 DIAGNOSIS — R339 Retention of urine, unspecified: Secondary | ICD-10-CM | POA: Insufficient documentation

## 2021-04-01 DIAGNOSIS — I10 Essential (primary) hypertension: Secondary | ICD-10-CM | POA: Diagnosis not present

## 2021-04-01 DIAGNOSIS — Z85828 Personal history of other malignant neoplasm of skin: Secondary | ICD-10-CM | POA: Diagnosis not present

## 2021-04-01 DIAGNOSIS — Z87891 Personal history of nicotine dependence: Secondary | ICD-10-CM | POA: Insufficient documentation

## 2021-04-01 DIAGNOSIS — Z91010 Allergy to peanuts: Secondary | ICD-10-CM | POA: Diagnosis not present

## 2021-04-01 DIAGNOSIS — K5900 Constipation, unspecified: Secondary | ICD-10-CM | POA: Diagnosis not present

## 2021-04-01 DIAGNOSIS — G20 Parkinson's disease: Secondary | ICD-10-CM | POA: Insufficient documentation

## 2021-04-01 DIAGNOSIS — Z79899 Other long term (current) drug therapy: Secondary | ICD-10-CM | POA: Diagnosis not present

## 2021-04-01 DIAGNOSIS — R1032 Left lower quadrant pain: Secondary | ICD-10-CM | POA: Diagnosis present

## 2021-04-01 LAB — BASIC METABOLIC PANEL
Anion gap: 7 (ref 5–15)
BUN: 44 mg/dL — ABNORMAL HIGH (ref 8–23)
CO2: 26 mmol/L (ref 22–32)
Calcium: 9.2 mg/dL (ref 8.9–10.3)
Chloride: 101 mmol/L (ref 98–111)
Creatinine, Ser: 1.7 mg/dL — ABNORMAL HIGH (ref 0.61–1.24)
GFR, Estimated: 40 mL/min — ABNORMAL LOW (ref >60)
Glucose, Bld: 225 mg/dL — ABNORMAL HIGH (ref 70–99)
Potassium: 4.3 mmol/L (ref 3.5–5.1)
Sodium: 134 mmol/L — ABNORMAL LOW (ref 135–145)

## 2021-04-01 LAB — CBC
HCT: 40.2 % (ref 39.0–52.0)
Hemoglobin: 13.3 g/dL (ref 13.0–17.0)
MCH: 28.9 pg (ref 26.0–34.0)
MCHC: 33.1 g/dL (ref 30.0–36.0)
MCV: 87.2 fL (ref 80.0–100.0)
Platelets: 204 10*3/uL (ref 150–400)
RBC: 4.61 MIL/uL (ref 4.22–5.81)
RDW: 15.1 % (ref 11.5–15.5)
WBC: 11.5 10*3/uL — ABNORMAL HIGH (ref 4.0–10.5)
nRBC: 0 % (ref 0.0–0.2)

## 2021-04-01 MED ORDER — MIDAZOLAM HCL 2 MG/2ML IJ SOLN
INTRAMUSCULAR | Status: AC
  Filled 2021-04-01: qty 2

## 2021-04-01 MED ORDER — FENTANYL CITRATE (PF) 100 MCG/2ML IJ SOLN
INTRAMUSCULAR | Status: AC
  Filled 2021-04-01: qty 2

## 2021-04-01 MED ORDER — SODIUM CHLORIDE 0.9 % IV SOLN: INTRAVENOUS | Status: DC

## 2021-04-01 MED ORDER — POLYETHYLENE GLYCOL 3350 17 G PO PACK
17.0000 g | PACK | Freq: Every day | ORAL | Status: DC
  Administered 2021-04-01: 17 g via ORAL
  Filled 2021-04-01: qty 1

## 2021-04-01 MED ORDER — SODIUM CHLORIDE 0.9% FLUSH: 5.0000 mL | Freq: Three times a day (TID) | INTRAVENOUS | Status: DC

## 2021-04-01 MED ORDER — MIDAZOLAM HCL 2 MG/2ML IJ SOLN
INTRAMUSCULAR | Status: AC | PRN
  Administered 2021-04-01: 0.5 mg via INTRAVENOUS

## 2021-04-01 NOTE — Discharge Instructions (Addendum)
Consider taking MiraLAX 3 times daily to help promote stool passage.  Follow-up with your primary provider or return to the ED if needed.

## 2021-04-01 NOTE — Progress Notes (Signed)
Patient has remained clinically stable post SPT placement per Dr Laurence Ferrari, tolerated well. Son has been at bedside post procedure with full discharge instructions given to son and patient. 14 FR SPT placed today, will return for nonsedated upsize in 6 weeks, with son stating since patient received only Versed 0.5 mg along with local for this procedure, should be ok for just local anesthetic for upsize. Extra leg bag and dressing supplies given to son. Vitals have remained stable post procedure.

## 2021-04-01 NOTE — ED Provider Notes (Signed)
[facility] Emergency Department Provider Note  ____________________________________________   Event Date/Time   First MD Initiated Contact with Patient 04/01/21 1741     (approximate)  I have reviewed the triage vital signs and the nursing notes.   HISTORY  Chief Complaint Post-op Problem  HPI Jonathon Welch is a 85 y.o. male with the below medical history, presents to the ED following a CT-guided suprapubic catheter placement earlier today.  Patient apparently has BPH, and has been utilizing an indwelling Foley cath for several weeks.  He also has a renal mass has been identified on MRI.  Patient proceeded with a suprapubic cath placement today secondary to urinary retention that did not respond to alpha blockers.  Presents now reporting some left lower quadrant pain with onset at home, after the tube was placed.  He denies any fever chills, sweats, nausea, or vomiting.  He does note that he has not had a bowel movement in 3 days prior to his procedure, which is not unusual for him.  He did have minimal stool passage after enema at home.  He took a dose of Colace stool softener once he got home this afternoon, but denies any benefit.  Patient denies any current pain at this time.  Past Medical History:  Diagnosis Date   Anemia    vitamin b12 deficiency   Aortic valve disease    Atrial fibrillation ([place])    Cancer (HCC)    basal and squamous cell skin cancer   DDD (degenerative disc disease), lumbar 07/25/2016   Depression    Dysrhythmia    atrial fibrillation   Herniation through surgical site    Hyperlipidemia    Hypertension    Left inguinal hernia 2019   Migraine headache    less frequent lately   Parkinson disease ([place])    Polycystic kidney disease    Right inguinal hernia    Tremor    Ventral hernia 2019    Patient Active Problem List   Diagnosis Date Noted   Renal mass 96/29/5284   Complicated UTI (urinary tract infection) 03/02/2021    Chronic anticoagulation 03/02/2021   Sepsis secondary to UTI ([place]) 03/01/2021   Non-recurrent inguinal hernia without obstruction or gangrene    Ventral hernia without obstruction or gangrene    B12 deficiency 07/25/2016   DDD (degenerative disc disease), lumbar 07/25/2016   Chronic atrial fibrillation ([place]) 01/14/2016   Combined fat and carbohydrate induced hyperlipemia 01/14/2015   H/O aortic valve replacement 04/10/2014   Convergent squint 09/03/2013   Aortic valve defect 03/21/2012   Hypertension 03/21/2012   Benign essential HTN 04/14/2008   Benign prostatic hypertrophy without urinary obstruction 04/13/2008   Parkinson's disease ([place]) 04/13/2008    Past Surgical History:  Procedure Laterality Date   AORTIC AND MITRAL VALVE REPLACEMENT N/A    APPENDECTOMY     ASCENDING AORTIC ANEURYSM REPAIR W/ MECHANICAL AORTIC VALVE REPLACEMENT  1985   aneurysm repaired when replaced "blown out" tissue valve   CARDIAC VALVE REPLACEMENT  1980 and 1985   aortic valve replaced, mechanical valve   CORONARY ARTERY BYPASS GRAFT  1980 and 1985   aortic valve replaced not CABG   MOHS SURGERY     nose and ear   ROBOTIC ASSISTED LAPAROSCOPIC VENTRAL/INCISIONAL HERNIA REPAIR Right 04/06/2018   Procedure: ROBOTIC ASSISTED LAPAROSCOPIC VENTRAL/INCISIONAL HERNIA REPAIR AND RIGHT INGUINAL HERNIA;  Surgeon: Jules Husbands, MD;  Location: [facility] ORS;  Service: General;  Laterality: Right;    Prior  to Admission medications   Medication Sig Start Date End Date Taking? Authorizing Provider  acetaminophen (TYLENOL) 500 MG tablet Take 500 mg by mouth daily as needed for moderate pain or headache.    [provider]  amLODipine (NORVASC) 2.5 MG tablet Take by mouth. 08/24/20   [provider]  apixaban (ELIQUIS) 2.5 MG TABS tablet Take 2.5 mg by mouth 2 (two) times daily.    [provider]  carbidopa-levodopa (SINEMET CR) 50-200 MG tablet Take 1 tablet by mouth at bedtime.     [provider]  carbidopa-levodopa (SINEMET IR) 25-100 MG tablet Take 1.5 tablets by mouth 3 (three) times daily.  01/12/16   [provider]  ferrous gluconate (FERGON) 324 MG tablet Take 324 mg by mouth daily with breakfast.    [provider]  finasteride (PROSCAR) 5 MG tablet Take 1 tablet (5 mg total) by mouth daily. 03/19/21   Stoioff, Ronda Fairly, MD  furosemide (LASIX) 20 MG tablet Take 40 mg by mouth daily.    [provider]  metolazone (ZAROXOLYN) 2.5 MG tablet Take 2.5 mg by mouth as needed.    [provider]  mirtazapine (REMERON) 7.5 MG tablet Take 7.5 mg by mouth at bedtime.    [provider]  omeprazole (PRILOSEC) 40 MG capsule Take 1 capsule by mouth daily. 02/22/21   [provider]  selegiline (ELDEPRYL) 5 MG capsule Take 5 mg by mouth 2 (two) times daily.  06/07/16   [provider]  silodosin (RAPAFLO) 4 MG CAPS capsule TAKE 1 CAPSULE BY MOUTH ONCE DAILY WITH BREAKFAST 03/16/21   Vaillancourt, Aldona Bar, PA-C    Allergies Peanut-containing drug products  Family History  Problem Relation Age of Onset   Heart disease Mother    Hypertension Mother    Stroke Father    Kidney disease Sister     Social History Social History   Tobacco Use   Smoking status: Former    Packs/day: 2.00    Years: 11.00    Pack years: 22.00    Types: Cigarettes    Quit date: 1971    Years since quitting: 51.4   Smokeless tobacco: Never  Vaping Use   Vaping Use: Never used  Substance Use Topics   Alcohol use: No    Comment: occasional beer   Drug use: No    Review of Systems Constitutional: No fever/chills Eyes: No visual changes. ENT: No sore throat. Cardiovascular: Denies chest pain. Respiratory: Denies shortness of breath. Gastrointestinal: Left lower quadrant abdominal pain.  No nausea, no vomiting.  No diarrhea.  No constipation. Genitourinary: Negative for dysuria. Musculoskeletal: Negative for back  pain. Skin: Negative for rash. Neurological: Negative for headaches, focal weakness or numbness.  ____________________________________________   PHYSICAL EXAM:  VITAL SIGNS: ED Triage Vitals  Enc Vitals Group     BP 04/01/21 1638 (!) 155/73     Pulse Rate 04/01/21 1638 (!) 55     Resp 04/01/21 1638 20     Temp 04/01/21 1638 97.8 F (36.6 C)     Temp Source 04/01/21 1638 Oral     SpO2 04/01/21 1638 99 %     Weight 04/01/21 1639 154 lb 5.2 oz (70 kg)     Height 04/01/21 1639 5\' 8"  (1.727 m)     Head Circumference --      Peak Flow --      Pain Score 04/01/21 1639 3     Pain Loc --  Pain Edu? --      Excl. in [place]? --     Constitutional: Alert and oriented. Well appearing and in no acute distress. Eyes: Conjunctivae are normal. PERRL. EOMI. Head: Atraumatic. Mouth/Throat: Mucous membranes are moist.  Oropharynx non-erythematous. Cardiovascular: Normal rate, regular rhythm. Grossly normal heart sounds.  Good peripheral circulation. Respiratory: Normal respiratory effort.  No retractions. Lungs CTAB. Gastrointestinal: Soft, mildly distended, and nontender. Normal bowel sounds noted. No abdominal bruits. No CVA tenderness.  Suprapubic cath in place with Tegaderm dressing.  No tenderness or surrounding erythema or induration noticed. Musculoskeletal: No lower extremity tenderness nor edema.  No joint effusions. Neurologic:  Normal speech and language. No gross focal neurologic deficits are appreciated. No gait instability. Skin:  Skin is warm, dry and intact. No rash noted. Psychiatric: Mood and affect are normal. Speech and behavior are normal.  ____________________________________________   LABS (all labs ordered are listed, but only abnormal results are displayed)  Labs Reviewed  CBC - Abnormal; Notable for the following components:      Result Value   WBC 11.5 (*)    All other components within normal limits  BASIC METABOLIC PANEL - Abnormal; Notable for the  following components:   Sodium 134 (*)    Glucose, Bld 225 (*)    BUN 44 (*)    Creatinine, Ser 1.70 (*)    GFR, Estimated 40 (*)    All other components within normal limits   ____________________________________________  EKG  ____________________________________________  RADIOLOGY I, Melvenia Needles, personally viewed and evaluated these images (plain radiographs) as part of my medical decision making, as well as reviewing the written report by the radiologist.  ED MD interpretation:  agree with report  Official radiology report(s): DG Abdomen 1 View  Result Date: 04/01/2021 CLINICAL DATA:  Left lower quadrant pain after suprapubic catheter placed today. EXAM: ABDOMEN - 1 VIEW COMPARISON:  09/03/2005 FINDINGS: Gas and stool throughout the colon. No small or large bowel distention. No radiopaque stones. Pigtail catheter in the low pelvis consistent with suprapubic catheter placement. Vascular calcifications. Degenerative changes in the lumbar spine with lumbar scoliosis convex towards the right. IMPRESSION: Nonobstructive bowel gas pattern with stool-filled colon. Suprapubic catheter projected over the low pelvis. Electronically Signed   By: Lucienne Capers M.D.   On: 04/01/2021 17:19   CT IMAGE GUIDED DRAINAGE BY PERCUTANEOUS CATHETER  Result Date: 04/01/2021 INDICATION: 85 year old male with chronic urinary retention and an indwelling Foley catheter. He presents for suprapubic catheter placement. EXAM: CT IMAGE GUIDED DRAINAGE BY PERCUTANEOUS CATHETER COMPARISON:  None. MEDICATIONS: None ANESTHESIA/SEDATION: 0.5 mg Versed administered for anxiolysis. This does not constitute moderate sedation. CONTRAST:  None FLUOROSCOPY TIME:  None COMPLICATIONS: None immediate. PROCEDURE: Informed written consent was obtained from the patient after a thorough discussion of the procedural risks, benefits and alternatives. All questions were addressed. Maximal Sterile Barrier Technique was  utilized including caps, mask, sterile gowns, sterile gloves, sterile drape, hand hygiene and skin antiseptic. A timeout was performed prior to the initiation of the procedure. A planning axial CT scan was performed. The bladder was successfully identified. There was concern based on prior imaging that the large cyst exophytic from the lower pole of the right kidney may require percutaneous aspiration prior to successful suprapubic catheter placement. However, on today's imaging, the cyst is remote from the location of the bladder and also surrounded by small bowel. There is no safe window or necessity to aspirate the renal cyst. A suitable skin entry site  to allow access to the anterior bladder wall was identified and marked. The overlying skin was sterilely prepped and draped in the standard fashion using chlorhexidine skin prep. Local anesthesia was attained by infiltration with 1% lidocaine. A small dermatotomy was made. Under intermittent CT guidance, an 18 gauge trocar needle was advanced through the rectus abdominus musculature and into the bladder. A 0.035 wire was then advanced into the bladder. The skin tract was serially dilated to 14 [place]. A Cook 4 [place] all-purpose drainage catheter was then advanced over the wire and formed. There was return of clear yellow urine. The catheter was secured to the skin with 0 Prolene suture. Follow-up CT imaging demonstrates a well-positioned percutaneous suprapubic catheter. No evidence of immediate complication. IMPRESSION: Successful placement of a 58 French percutaneous suprapubic catheter. Return Interventional Radiology in 4-6 weeks for tube exchange and up size. Signed, Criselda Peaches, MD, [facility] Electronically Signed   By: Jacqulynn Cadet M.D.   On: 04/01/2021 14:58    ____________________________________________   PROCEDURES  Procedure(s) performed (including Critical  Care):  Procedures  Miralax 17 g PO  ____________________________________________   INITIAL IMPRESSION / ASSESSMENT AND PLAN / ED COURSE  As part of my medical decision making, I reviewed the following data within the [place] History obtained from family, Labs reviewed WNL, Radiograph reviewed large colonic stool burden, and Notes from prior ED visits   DDX: constipation, diverticular disease, infectious colitis  Patient with ED evaluation of postoperative left lower quadrant pain that is currently resolved.  Clinically the patient appears to have some moderate stool burden confirmed on plain x-ray.  Patient is advised that he may need to take multiple doses of PEG over the next couple days to help produce the meaningful stool.  He is given a dose in the ED at this time.  He denies any acute pain, and will be discharged follow-up with primary provider for ongoing symptoms.  Return precautions have been discussed. ____________________________________________   FINAL CLINICAL IMPRESSION(S) / ED DIAGNOSES  Final diagnoses:  Constipation, unspecified constipation type     ED Discharge Orders     None        Note:  This document was prepared using Dragon voice recognition software and may include unintentional dictation errors.    Melvenia Needles, PA-C 04/01/21 1830    Carrie Mew, MD 04/01/21 2325

## 2021-04-01 NOTE — ED Triage Notes (Signed)
Pt to ED POV for LLQ pain after having suprapubic catheter place today. Reports extreme abd pain a few hours after having placed.  Denies N/v Discussed pt with Dr Joni Fears  Hx parkinsons

## 2021-04-01 NOTE — Procedures (Signed)
Interventional Radiology Procedure Note  Procedure: Placement of a 91F suprapubic catheter.   Complications: None  Estimated Blood Loss: None  Recommendations: - Tube to foley bag - Return to IR in 6 weeks for drain exchange and upsize   Signed,  Criselda Peaches, MD

## 2021-04-09 ENCOUNTER — Other Ambulatory Visit: Payer: Self-pay

## 2021-04-09 ENCOUNTER — Ambulatory Visit
Admission: RE | Admit: 2021-04-09 | Discharge: 2021-04-09 | Disposition: A | Discharge status: Home / Self Care | Payer: Medicare Other | Source: Ambulatory Visit | Attending: Urology | Admitting: Urology

## 2021-04-09 DIAGNOSIS — N2889 Other specified disorders of kidney and ureter: Secondary | ICD-10-CM

## 2021-04-09 MED ORDER — GADOBUTROL 1 MMOL/ML IV SOLN
7.0000 mL | Freq: Once | INTRAVENOUS | Status: AC | PRN
  Administered 2021-04-09: 7 mL via INTRAVENOUS

## 2021-04-12 ENCOUNTER — Other Ambulatory Visit: Payer: Self-pay | Admitting: Physician Assistant

## 2021-04-12 ENCOUNTER — Telehealth: Payer: Self-pay | Admitting: *Deleted

## 2021-04-12 DIAGNOSIS — R339 Retention of urine, unspecified: Secondary | ICD-10-CM

## 2021-04-12 NOTE — Telephone Encounter (Signed)
Notified patient as instructed, patient pleased °

## 2021-04-12 NOTE — Telephone Encounter (Signed)
-----   Message from Abbie Sons, MD sent at 04/11/2021 10:52 AM EDT ----- The renal mass seen on CT is consistent with a benign cyst on MRI and no findings suspicious for cancer.  Periodic monitoring is recommended to assess for any changes and would recommend a repeat MRI in 1 year

## 2021-04-13 ENCOUNTER — Other Ambulatory Visit: Payer: Self-pay | Admitting: Physician Assistant

## 2021-04-13 DIAGNOSIS — R339 Retention of urine, unspecified: Secondary | ICD-10-CM

## 2021-04-14 ENCOUNTER — Telehealth: Payer: Self-pay

## 2021-04-14 NOTE — Telephone Encounter (Signed)
Incoming call on triage line from [place] [facility] in regards to patients refill for silodosin being denied as refill not appropriate. Per Sam, the patient has a foley catheter and the medication is not appropriate. Advised pharmacy that should the patient have any questions or concerns to call the office.

## 2021-04-19 ENCOUNTER — Encounter: Payer: Self-pay | Admitting: Urology

## 2021-04-27 ENCOUNTER — Telehealth: Payer: Self-pay

## 2021-04-27 DIAGNOSIS — R339 Retention of urine, unspecified: Secondary | ICD-10-CM

## 2021-04-27 NOTE — Telephone Encounter (Signed)
Order placed for a SPT exchange/ upsizing in IR , message sent to scheduling. Awaiting response  Called and spoke with patient and wife and questions were answered in regards to dressing changes. Will call back with scheduled date and time

## 2021-04-27 NOTE — Telephone Encounter (Signed)
Per scheduling: patient scheduled for Fri 7/29 coming in 8:30a for a 9a appt  Patient's wife notified, patient does not need to stop Eliquis for an exchange. She verbalized understanding. NPO midnight

## 2021-05-07 NOTE — Progress Notes (Signed)
Patient on schedule for unsedated SPT upsize for 05/14/2021. Spoke with Desteny Freeman/son on phone with pre procedure instructions given. Made aware to be here @ 0830, may eat/meds since no sedation. Stated understanding.

## 2021-05-14 ENCOUNTER — Other Ambulatory Visit: Payer: Self-pay

## 2021-05-14 ENCOUNTER — Ambulatory Visit
Admission: RE | Admit: 2021-05-14 | Discharge: 2021-05-14 | Disposition: A | Discharge status: Home / Self Care | Payer: Medicare Other | Source: Ambulatory Visit | Attending: Urology | Admitting: Urology

## 2021-05-14 DIAGNOSIS — R339 Retention of urine, unspecified: Secondary | ICD-10-CM | POA: Insufficient documentation

## 2021-05-14 DIAGNOSIS — I4891 Unspecified atrial fibrillation: Secondary | ICD-10-CM | POA: Diagnosis not present

## 2021-05-14 DIAGNOSIS — Z91010 Allergy to peanuts: Secondary | ICD-10-CM | POA: Insufficient documentation

## 2021-05-14 DIAGNOSIS — Z79899 Other long term (current) drug therapy: Secondary | ICD-10-CM | POA: Diagnosis not present

## 2021-05-14 DIAGNOSIS — I110 Hypertensive heart disease with heart failure: Secondary | ICD-10-CM | POA: Insufficient documentation

## 2021-05-14 DIAGNOSIS — Z7901 Long term (current) use of anticoagulants: Secondary | ICD-10-CM | POA: Diagnosis not present

## 2021-05-14 DIAGNOSIS — I509 Heart failure, unspecified: Secondary | ICD-10-CM | POA: Insufficient documentation

## 2021-05-14 DIAGNOSIS — G20 Parkinson's disease: Secondary | ICD-10-CM | POA: Diagnosis not present

## 2021-05-14 DIAGNOSIS — Z87891 Personal history of nicotine dependence: Secondary | ICD-10-CM | POA: Insufficient documentation

## 2021-05-14 DIAGNOSIS — Z435 Encounter for attention to cystostomy: Secondary | ICD-10-CM | POA: Insufficient documentation

## 2021-05-14 DIAGNOSIS — Q613 Polycystic kidney, unspecified: Secondary | ICD-10-CM | POA: Insufficient documentation

## 2021-05-14 HISTORY — PX: IR CATHETER TUBE CHANGE: IMG717

## 2021-05-14 MED ORDER — IODIXANOL 320 MG/ML IV SOLN
25.0000 mL | Freq: Once | INTRAVENOUS | Status: AC | PRN
  Administered 2021-05-14: 10 mL

## 2021-05-14 NOTE — H&P (Signed)
Chief Complaint: Urinary retention. Request is for suprapubic catheter exchange and upsize.  Referring Physician(s): Stoioff,Scott C  Supervising Physician: Aletta Edouard  Patient Status: [facility] - Out-pt  History of Present Illness: Jonathon Welch is a 85 y.o. male 85 y.o. male outpatient. History of  a fib ( on eliquis), polycystic kidney disease,  parkinson's disease, CHF, urinary retention with suprapubic catheter. IR placed a 14 Fr cook suprapubic catheter on 6.16.22. Patient presents for suprapubic catheter exchange and upsize.   Currently without any significant complaints. Endorsing pain around the suprapubic cathter site. Patient alert and laying in bed, calm and comfortable. Denies any fevers, headache, chest pain, SOB, cough, abdominal pain, nausea, vomiting or bleeding. Son at bedside is requesting education on dressing changes and thinks that the suprapubic pain may be associated with the dressing changes. Return precautions and treatment recommendations and follow-up discussed with the patient and son who are agreeable with the plan.     Past Medical History:  Diagnosis Date   Anemia    vitamin b12 deficiency   Aortic valve disease    Atrial fibrillation ([place])    Cancer (HCC)    basal and squamous cell skin cancer   DDD (degenerative disc disease), lumbar 07/25/2016   Depression    Dysrhythmia    atrial fibrillation   Herniation through surgical site    Hyperlipidemia    Hypertension    Left inguinal hernia 2019   Migraine headache    less frequent lately   Parkinson disease ([place])    Polycystic kidney disease    Right inguinal hernia    Tremor    Ventral hernia 2019    Past Surgical History:  Procedure Laterality Date   AORTIC AND MITRAL VALVE REPLACEMENT N/A    APPENDECTOMY     ASCENDING AORTIC ANEURYSM REPAIR W/ MECHANICAL AORTIC VALVE REPLACEMENT  1985   aneurysm repaired when replaced "blown out" tissue valve   CARDIAC VALVE REPLACEMENT  1980  and 1985   aortic valve replaced, mechanical valve   CORONARY ARTERY BYPASS GRAFT  1980 and 1985   aortic valve replaced not CABG   MOHS SURGERY     nose and ear   ROBOTIC ASSISTED LAPAROSCOPIC VENTRAL/INCISIONAL HERNIA REPAIR Right 04/06/2018   Procedure: ROBOTIC ASSISTED LAPAROSCOPIC VENTRAL/INCISIONAL HERNIA REPAIR AND RIGHT INGUINAL HERNIA;  Surgeon: Jules Husbands, MD;  Location: [facility] ORS;  Service: General;  Laterality: Right;    Allergies: Peanut-containing drug products  Medications: Prior to Admission medications   Medication Sig Start Date End Date Taking? Authorizing Provider  acetaminophen (TYLENOL) 500 MG tablet Take 500 mg by mouth daily as needed for moderate pain or headache.    [provider]  amLODipine (NORVASC) 2.5 MG tablet Take by mouth. 08/24/20   [provider]  apixaban (ELIQUIS) 2.5 MG TABS tablet Take 2.5 mg by mouth 2 (two) times daily.    [provider]  carbidopa-levodopa (SINEMET CR) 50-200 MG tablet Take 1 tablet by mouth at bedtime.    [provider]  carbidopa-levodopa (SINEMET IR) 25-100 MG tablet Take 1.5 tablets by mouth 3 (three) times daily.  01/12/16   [provider]  ferrous gluconate (FERGON) 324 MG tablet Take 324 mg by mouth daily with breakfast.    [provider]  finasteride (PROSCAR) 5 MG tablet Take 1 tablet (5 mg total) by mouth daily. 03/19/21   Stoioff, Ronda Fairly, MD  furosemide (LASIX) 20 MG tablet Take 40 mg by mouth daily.  [provider]  metolazone (ZAROXOLYN) 2.5 MG tablet Take 2.5 mg by mouth as needed.    [provider]  mirtazapine (REMERON) 7.5 MG tablet Take 7.5 mg by mouth at bedtime.    [provider]  omeprazole (PRILOSEC) 40 MG capsule Take 1 capsule by mouth daily. 02/22/21   [provider]  selegiline (ELDEPRYL) 5 MG capsule Take 5 mg by mouth 2 (two) times daily.  06/07/16   [provider]  silodosin (RAPAFLO) 4 MG CAPS  capsule TAKE 1 CAPSULE BY MOUTH ONCE DAILY WITH BREAKFAST 03/16/21   Debroah Loop, PA-C     Family History  Problem Relation Age of Onset   Heart disease Mother    Hypertension Mother    Stroke Father    Kidney disease Sister     Social History   Socioeconomic History   Marital status: Married    Spouse name: Not on file   Number of children: Not on file   Years of education: Not on file   Highest education level: Not on file  Occupational History   Not on file  Tobacco Use   Smoking status: Former    Packs/day: 2.00    Years: 11.00    Pack years: 22.00    Types: Cigarettes    Quit date: 1971    Years since quitting: 51.6   Smokeless tobacco: Never  Vaping Use   Vaping Use: Never used  Substance and Sexual Activity   Alcohol use: No    Comment: occasional beer   Drug use: No   Sexual activity: Not Currently  Other Topics Concern   Not on file  Social History Narrative   Not on file   Social Determinants of Health   Financial Resource Strain: Not on file  Food Insecurity: Not on file  Transportation Needs: Not on file  Physical Activity: Not on file  Stress: Not on file  Social Connections: Not on file     Review of Systems: A 12 point ROS discussed and pertinent positives are indicated in the HPI above.  All other systems are negative.  Review of Systems  Constitutional:  Negative for fever.  HENT:  Negative for congestion.   Respiratory:  Negative for cough and shortness of breath.   Cardiovascular:  Negative for chest pain.  Gastrointestinal:  Negative for abdominal pain.  Genitourinary:        Pain around suprapubic site.  Neurological:  Negative for headaches.  Psychiatric/Behavioral:  Negative for behavioral problems and confusion.    Vital Signs: BP (!) 191/96   Pulse 64   Temp 98.1 F (36.7 C) (Oral)   Resp 15   Wt 155 lb (70.3 kg)   BMI 23.57 kg/m   Physical Exam Vitals and nursing note reviewed.  Constitutional:       Appearance: He is well-developed.  HENT:     Head: Normocephalic.  Pulmonary:     Effort: Pulmonary effort is normal.  Musculoskeletal:        General: Normal range of motion.     Cervical back: Normal range of motion.  Skin:    General: Skin is dry.  Neurological:     Mental Status: He is alert and oriented to person, place, and time.    Imaging: No results found.  Labs:  CBC: Recent Labs    03/03/21 0513 03/04/21 0021 03/05/21 0538 04/01/21 1641  WBC 11.8* 9.8 7.5 11.5*  HGB 13.2 12.7* 13.3 13.3  HCT 41.3 39.9  41.0 40.2  PLT 181 177 184 204    COAGS: Recent Labs    03/02/21 0535  INR 1.5*    BMP: Recent Labs    03/03/21 0513 03/04/21 0021 03/05/21 0538 04/01/21 1641  NA 136 134* 137 134*  K 4.5 4.4 4.1 4.3  CL 106 106 107 101  CO2 22 19* 24 26  GLUCOSE 106* 186* 129* 225*  BUN 36* 39* 29* 44*  CALCIUM 8.5* 8.3* 8.7* 9.2  CREATININE 1.31* 1.44* 1.11 1.70*  GFRNONAA 54* 49* >60 40*    LIVER FUNCTION TESTS: Recent Labs    03/01/21 2113  BILITOT 1.1  AST 14*  ALT <5  ALKPHOS 143*  PROT 7.0  ALBUMIN 3.2*      Assessment and Plan:  85 y.o. male outpatient. History of  a fib ( on eliquis), polycystic kidney disease,  parkinson's disease, CHF, urinary retention with suprapubic catheter. IR placed a 14 Fr cook suprapubic catheter on 6.16.22. Patient presents for suprapubic catheter exchange and upsize.  Labs from 6.16.22 shows WBC 11.5, BUN 44, Cr 1.7. Patient is on eliquis. No pertinent allergies. Son is at bedside.   Risks and benefits discussed with the patient including bleeding, infection, damage to adjacent structures, bowel perforation/fistula connection, and sepsis.  All of the patient's questions were answered, patient is agreeable to proceed. Consent signed and in chart.     Thank you for this interesting consult.  I greatly enjoyed meeting SEBASHTIAN ZWEIG and look forward to participating in their care.  A copy of this report was  sent to the requesting provider on this date.  Electronically Signed: Jacqualine Mau, NP 05/14/2021, 8:52 AM   I spent a total of  30 Minutes   in face to face in clinical consultation, greater than 50% of which was counseling/coordinating care for suprapubic catheter exchange and upsize

## 2021-05-21 ENCOUNTER — Telehealth: Payer: Medicare Other

## 2021-05-21 NOTE — Telephone Encounter (Signed)
Pt son called stating he received an email from patients wife that the SP tube has "split" I advised pt to call our office when he gets to patients house to further evaluate and take the next steps on what to do. Pt son agreed Acupuncturist) and will call back

## 2021-05-21 NOTE — Telephone Encounter (Signed)
Pt son called back. Believes the connector tube may have fallen off. Pt son states he fixed the connector and now catheter is draining fine. No complaints or symptoms from pt. Pt son will call back if any other issues occur.

## 2021-06-17 ENCOUNTER — Ambulatory Visit: Payer: Medicare Other | Admitting: Urology

## 2021-06-17 ENCOUNTER — Other Ambulatory Visit: Payer: Self-pay

## 2021-06-17 ENCOUNTER — Ambulatory Visit (INDEPENDENT_AMBULATORY_CARE_PROVIDER_SITE_OTHER): Payer: Medicare Other | Admitting: Physician Assistant

## 2021-06-17 DIAGNOSIS — R339 Retention of urine, unspecified: Secondary | ICD-10-CM

## 2021-06-17 NOTE — Progress Notes (Signed)
Suprapubic Cath Change  Patient is present today for a suprapubic catheter change due to urinary retention.  28m of water was drained from the balloon, a 16FR [place] tip foley cath was removed from the tract without difficulty.  Site was cleaned and prepped in a sterile fashion with betadine.  A 16FR foley cath was replaced into the tract no complications were noted. Urine return was noted, 10 ml of sterile water was inflated into the balloon and patient's leg bag was attached for drainage.  Patient tolerated well. A night bag was given to patient and proper instruction was given on how to switch bags.    Performed by: SDebroah Loop PA-C   Follow up: Return in about 4 weeks (around 07/15/2021) for SPT exchange.

## 2021-07-19 ENCOUNTER — Other Ambulatory Visit: Payer: Self-pay

## 2021-07-19 ENCOUNTER — Encounter: Payer: Self-pay | Admitting: Physician Assistant

## 2021-07-19 ENCOUNTER — Ambulatory Visit: Payer: Medicare Other | Admitting: Physician Assistant

## 2021-07-19 VITALS — BP 187/70 | HR 53 | Ht 68.0 in | Wt 151.0 lb

## 2021-07-19 DIAGNOSIS — R339 Retention of urine, unspecified: Secondary | ICD-10-CM

## 2021-07-19 NOTE — Progress Notes (Signed)
Suprapubic Cath Change  Patient is present today for a suprapubic catheter change due to urinary retention.  24ml of water was drained from the balloon, a 16FR foley cath was removed from the tract without difficulty.  Site was cleaned and prepped in a sterile fashion with betadine.  A 16FR foley cath was replaced into the tract no complications were noted. Urine return was noted, 10 ml of sterile water was inflated into the balloon and a leg bag was attached for drainage.  Patient tolerated well.    Performed by: Debroah Loop, PA-C   Follow up: Return in about 4 weeks (around 08/16/2021) for SPT exchange.

## 2021-08-19 ENCOUNTER — Ambulatory Visit: Payer: Medicare Other | Admitting: Physician Assistant

## 2021-08-19 ENCOUNTER — Other Ambulatory Visit: Payer: Self-pay

## 2021-08-19 DIAGNOSIS — R339 Retention of urine, unspecified: Secondary | ICD-10-CM

## 2021-08-19 NOTE — Patient Instructions (Signed)
At least 4 hours prior to your appointment with me on 12/12, please remove your leg bag and plug your catheter to allow urine to accumulate in your bladder. Then I'd like you to drink at least 3 glasses (24 oz) of fluids and urinate through your penis as best as you can. When you get to clinic, I'll have you urinate one more time and then we'll use your suprapubic catheter to drain your bladder to see how much urine is left over.  If you are unable to urinate and having pain, it's ok to reconnect your leg bag before you see me and I'll just change your catheter that day.

## 2021-08-19 NOTE — Progress Notes (Signed)
Suprapubic Cath Change  Patient is present today for a suprapubic catheter change due to urinary retention.  27ml of water was drained from the balloon, a 16FR foley cath was removed from the tract without difficulty.  Site was cleaned and prepped in a sterile fashion with betadine.  A 16FR foley cath was replaced into the tract no complications were noted. Urine return was noted, 10 ml of sterile water was inflated into the balloon and a leg bag was attached for drainage.  Patient tolerated well.     Performed by: Debroah Loop, PA-C   Additional notes: Patient will have been on finasteride x6 months at the time of his next SPT change. We will plan for voiding trial next month instead. Provided patient and son with a catheter plug today and we discussed plugging his SPT in the morning with plans for afternoon PVR via bladder drainage (hernia--issues with bladder scan). If he fails, will proceed with SPT change and reattempt voiding trial after 9-10 months on finasteride.  Follow up: Return in about 4 weeks (around 09/16/2021) for Voiding trial.

## 2021-09-16 ENCOUNTER — Ambulatory Visit: Payer: Medicare Other | Admitting: Physician Assistant

## 2021-09-27 ENCOUNTER — Other Ambulatory Visit: Payer: Self-pay

## 2021-09-27 ENCOUNTER — Ambulatory Visit (INDEPENDENT_AMBULATORY_CARE_PROVIDER_SITE_OTHER): Payer: Medicare Other | Admitting: Physician Assistant

## 2021-09-27 DIAGNOSIS — R339 Retention of urine, unspecified: Secondary | ICD-10-CM

## 2021-09-27 NOTE — Patient Instructions (Signed)
Keep your suprapubic catheter plugged and continue urinating from your penis. If you develop difficulty urinating, ok to unplug the suprapubic catheter, reattach a drainage bag, and call our clinic to make [place] aware.  Otherwise, I'll plan to see you back in clinic in about 2 weeks. We'll unplug your catheter at that time to recheck how well you're emptying your bladder. If you are empty then too, then we'll go ahead and take your suprapubic catheter out and allow the tract to heal shut.

## 2021-09-27 NOTE — Progress Notes (Signed)
09/27/2021 2:06 PM   Jonathon Welch 1936-04-04 [id number]  CC: Chief Complaint  Patient presents with   Urinary Retention   HPI: Jonathon Welch is a 85 y.o. male with PMH Parkinson's disease, polycystic kidney disease, left renal mass on surveillance, and BPH with urinary retention managed with suprapubic catheter who presents today for voiding trial on finasteride x6 months. He is accompanied today by his son, who contributes to HPI.   Today he reports his SPT was plugged this morning and he subsequently was able to void 3 times without difficulty. I unplugged his SPT in clinic today to assess his residual, which was approximately 20ccs.  PMH: Past Medical History:  Diagnosis Date   Anemia    vitamin b12 deficiency   Aortic valve disease    Atrial fibrillation ([place])    Cancer (HCC)    basal and squamous cell skin cancer   DDD (degenerative disc disease), lumbar 07/25/2016   Depression    Dysrhythmia    atrial fibrillation   Herniation through surgical site    Hyperlipidemia    Hypertension    Left inguinal hernia 2019   Migraine headache    less frequent lately   Parkinson disease ([place])    Polycystic kidney disease    Right inguinal hernia    Tremor    Ventral hernia 2019    Surgical History: Past Surgical History:  Procedure Laterality Date   AORTIC AND MITRAL VALVE REPLACEMENT N/A    APPENDECTOMY     ASCENDING AORTIC ANEURYSM REPAIR W/ MECHANICAL AORTIC VALVE REPLACEMENT  1985   aneurysm repaired when replaced "blown out" tissue valve   CARDIAC VALVE REPLACEMENT  1980 and 1985   aortic valve replaced, mechanical valve   CORONARY ARTERY BYPASS GRAFT  1980 and 1985   aortic valve replaced not CABG   IR CATHETER TUBE CHANGE  05/14/2021   MOHS SURGERY     nose and ear   ROBOTIC ASSISTED LAPAROSCOPIC VENTRAL/INCISIONAL HERNIA REPAIR Right 04/06/2018   Procedure: ROBOTIC ASSISTED LAPAROSCOPIC VENTRAL/INCISIONAL HERNIA REPAIR AND RIGHT INGUINAL HERNIA;   Surgeon: Jules Husbands, MD;  Location: [facility] ORS;  Service: General;  Laterality: Right;    Home Medications:  Allergies as of 09/27/2021       Reactions   Peanut-containing Drug Products Other (See Comments)   Open sores inside of mouth previously. Stays away from peanuts now        Medication List        Accurate as of September 27, 2021  2:06 PM. If you have any questions, ask your nurse or doctor.          acetaminophen 500 MG tablet Commonly known as: TYLENOL Take 500 mg by mouth daily as needed for moderate pain or headache.   amLODipine 2.5 MG tablet Commonly known as: NORVASC Take by mouth.   apixaban 2.5 MG Tabs tablet Commonly known as: ELIQUIS Take 2.5 mg by mouth 2 (two) times daily.   carbidopa-levodopa 50-200 MG tablet Commonly known as: SINEMET CR Take 1 tablet by mouth at bedtime.   carbidopa-levodopa 25-100 MG tablet Commonly known as: SINEMET IR Take 1.5 tablets by mouth 3 (three) times daily.   CRANBERRY PO Take by mouth.   [employer] PROSTATE MAX + PO Take by mouth. beta-sitos/D3/minerals/cranber (PROSTATE MAX PLUS ORAL)   ferrous gluconate 324 MG tablet Commonly known as: FERGON Take 324 mg by mouth daily with breakfast.   finasteride 5 MG tablet Commonly known as: PROSCAR Take  1 tablet (5 mg total) by mouth daily.   furosemide 20 MG tablet Commonly known as: LASIX Take 40 mg by mouth daily.   metolazone 2.5 MG tablet Commonly known as: ZAROXOLYN Take 2.5 mg by mouth as needed.   mirtazapine 7.5 MG tablet Commonly known as: REMERON Take 7.5 mg by mouth at bedtime.   modafinil 100 MG tablet Commonly known as: PROVIGIL Take by mouth.   omeprazole 40 MG capsule Commonly known as: PRILOSEC Take 1 capsule by mouth daily.   selegiline 5 MG capsule Commonly known as: ELDEPRYL Take 5 mg by mouth 2 (two) times daily.        Allergies:  Allergies  Allergen Reactions   Peanut-Containing Drug Products Other (See Comments)     Open sores inside of mouth previously. Stays away from peanuts now    Family History: Family History  Problem Relation Age of Onset   Heart disease Mother    Hypertension Mother    Stroke Father    Kidney disease Sister     Social History:   reports that he quit smoking about 51 years ago. His smoking use included cigarettes. He has a 22.00 pack-year smoking history. He has never used smokeless tobacco. He reports that he does not drink alcohol and does not use drugs.  Physical Exam: There were no vitals taken for this visit.  Constitutional:  Alert and oriented, no acute distress, nontoxic appearing HEENT: [place], AT Cardiovascular: No clubbing, cyanosis, or edema Respiratory: Normal respiratory effort, no increased work of breathing Skin: No rashes, bruises or suspicious lesions Neurologic: Grossly intact, no focal deficits, moving all 4 extremities Psychiatric: Normal mood and affect  Suprapubic Cath Change  Patient is present today for a suprapubic catheter change due to urinary retention.  46ml of water was drained from the balloon, a 16FR foley cath was removed from the tract with out difficulty.  Site was cleaned and prepped in a sterile fashion with betadine.  A 16FR foley cath was replaced into the tract no complications were noted. 10 ml of sterile water was inflated into the balloon and the catheter was plugged.  Patient tolerated well.     Performed by: Debroah Loop, PA-C   Assessment & Plan:   1. Urinary retention Voiding trial passed today. In shared decision making with the patient and his son, we decided to keep his SPT in place and plugged for an additional 2 weeks pending reevaluation. If his residual remains appropriate in 2 weeks, will proceed with SPT removal. I exchanged his SPT in clinic today and kept it plugged; see note above. We discussed reattaching a drainage back to his SPT if he develops difficulty urinating in the interim.  Return in about 2  weeks (around 10/11/2021) for Repeat VT (no PVR needed).  Debroah Loop, PA-C  [facility] [address], [place], [place] [address] [phone]

## 2021-10-05 ENCOUNTER — Telehealth: Payer: Self-pay | Admitting: Physician Assistant

## 2021-10-05 NOTE — Telephone Encounter (Signed)
Pts son [name] called in and would like to speak with Sam or someone in regards to the pts upcoming procedure. He said his mother has some questions. His call back number is [phone].

## 2021-10-05 NOTE — Telephone Encounter (Signed)
Spoke w pt's son [name], who is concerned for pt's upcoming voiding trial on Thursday. Pt is due to potentially have cath removed on Thursday and pt's son is going out of town 12/26 and will not return until 1/2, and is worried about something going wrong while out of town. Per Sam, OK to move voiding trial out till son returns from out of town. Appt rescheduled, Scott confirmed appt.

## 2021-10-07 ENCOUNTER — Ambulatory Visit: Payer: Medicare Other | Admitting: Physician Assistant

## 2021-10-20 NOTE — Progress Notes (Signed)
10/21/2021 1:30 PM   Jonathon Welch Oct 12, 1936 [id number]  Referring provider: Rusty Aus, MD [facility] [place],  [place] [address]  Chief Complaint  Patient presents with   Urinary Retention   Urologic history: 1.  Polycystic kidney disease -MRI 03/2021 - Polycystic kidney disease with innumerable hepatic cyst and renal cysts  2. Bosniak 95F left renal cyst -MRI 03/2021 - Lesion of concern in lower pole of the LEFT kidney has hemorrhagic rim with no peripheral or internal enhancement. Findings most consistent with a complex hemorrhagic cyst. As lesion is new and moderately complex with categorized as Bosniak 95F . Recommend follow-up MRI 6 in 12 months  3. BPH with retention -Managed with suprapubic tube and finasteride 5 mg daily  HPI: Jonathon Welch is a 86 y.o. male who presents today for a repeat voiding trial.  Patient was able to pass a voiding trial approximately 3 weeks ago, but an abundance of caution it was decided to reevaluate in another 2 weeks to ensure patient is emptying bladder appropriately prior to removing the suprapubic tube.  Catheter plug is removed and patient is noted to have a PVR of less than 100 cc.      He has been able to void spontaneously the entire 2 weeks without having to unplug the SPT catheter.  Patient denies any modifying or aggravating factors.  Patient denies any gross hematuria, dysuria or suprapubic/flank pain.  Patient denies any fevers, chills, nausea or vomiting.    PMH: Past Medical History:  Diagnosis Date   Anemia    vitamin b12 deficiency   Aortic valve disease    Atrial fibrillation ([place])    Cancer (HCC)    basal and squamous cell skin cancer   DDD (degenerative disc disease), lumbar 07/25/2016   Depression    Dysrhythmia    atrial fibrillation   Herniation through surgical site    Hyperlipidemia    Hypertension    Left inguinal hernia 2019   Migraine headache     less frequent lately   Parkinson disease ([place])    Polycystic kidney disease    Right inguinal hernia    Tremor    Ventral hernia 2019    Surgical History: Past Surgical History:  Procedure Laterality Date   AORTIC AND MITRAL VALVE REPLACEMENT N/A    APPENDECTOMY     ASCENDING AORTIC ANEURYSM REPAIR W/ MECHANICAL AORTIC VALVE REPLACEMENT  1985   aneurysm repaired when replaced "blown out" tissue valve   CARDIAC VALVE REPLACEMENT  1980 and 1985   aortic valve replaced, mechanical valve   CORONARY ARTERY BYPASS GRAFT  1980 and 1985   aortic valve replaced not CABG   IR CATHETER TUBE CHANGE  05/14/2021   MOHS SURGERY     nose and ear   ROBOTIC ASSISTED LAPAROSCOPIC VENTRAL/INCISIONAL HERNIA REPAIR Right 04/06/2018   Procedure: ROBOTIC ASSISTED LAPAROSCOPIC VENTRAL/INCISIONAL HERNIA REPAIR AND RIGHT INGUINAL HERNIA;  Surgeon: Jules Husbands, MD;  Location: [facility] ORS;  Service: General;  Laterality: Right;    Home Medications:  Allergies as of 10/21/2021       Reactions   Peanut-containing Drug Products Other (See Comments)   Open sores inside of mouth previously. Stays away from peanuts now        Medication List        Accurate as of October 21, 2021  1:30 PM. If you have any questions, ask your nurse or doctor.  acetaminophen 500 MG tablet Commonly known as: TYLENOL Take 500 mg by mouth daily as needed for moderate pain or headache.   amLODipine 2.5 MG tablet Commonly known as: NORVASC Take by mouth.   apixaban 2.5 MG Tabs tablet Commonly known as: ELIQUIS Take 2.5 mg by mouth 2 (two) times daily.   carbidopa-levodopa 50-200 MG tablet Commonly known as: SINEMET CR Take 1 tablet by mouth at bedtime.   carbidopa-levodopa 25-100 MG tablet Commonly known as: SINEMET IR Take 1.5 tablets by mouth 3 (three) times daily.   CRANBERRY PO Take by mouth.   CVS PROSTATE MAX + PO Take by mouth. beta-sitos/D3/minerals/cranber (PROSTATE MAX PLUS ORAL)    ferrous gluconate 324 MG tablet Commonly known as: FERGON Take 324 mg by mouth daily with breakfast.   finasteride 5 MG tablet Commonly known as: PROSCAR Take 1 tablet (5 mg total) by mouth daily.   furosemide 20 MG tablet Commonly known as: LASIX Take 40 mg by mouth daily.   metolazone 2.5 MG tablet Commonly known as: ZAROXOLYN Take 2.5 mg by mouth as needed.   mirtazapine 7.5 MG tablet Commonly known as: REMERON Take 7.5 mg by mouth at bedtime.   modafinil 100 MG tablet Commonly known as: PROVIGIL Take by mouth.   omeprazole 40 MG capsule Commonly known as: PRILOSEC Take 1 capsule by mouth daily.   selegiline 5 MG capsule Commonly known as: ELDEPRYL Take 5 mg by mouth 2 (two) times daily.        Allergies:  Allergies  Allergen Reactions   Peanut-Containing Drug Products Other (See Comments)    Open sores inside of mouth previously. Stays away from peanuts now    Family History: Family History  Problem Relation Age of Onset   Heart disease Mother    Hypertension Mother    Stroke Father    Kidney disease Sister     Social History:  reports that he quit smoking about 52 years ago. His smoking use included cigarettes. He has a 22.00 pack-year smoking history. He has never used smokeless tobacco. He reports that he does not drink alcohol and does not use drugs.  ROS: Pertinent ROS in HPI  Physical Exam: Constitutional:  Well nourished. Alert and oriented, No acute distress. HEENT: [place] AT, mask in place.  Trachea midline Cardiovascular: No clubbing, cyanosis, or edema. Respiratory: Normal respiratory effort, no increased work of breathing. Neurologic: Grossly intact, no focal deficits, moving all 4 extremities. Psychiatric: Normal mood and affect.  Laboratory Data: Lab Results  Component Value Date   WBC 11.5 (H) 04/01/2021   HGB 13.3 04/01/2021   HCT 40.2 04/01/2021   MCV 87.2 04/01/2021   PLT 204 04/01/2021    Lab Results  Component Value  Date   CREATININE 1.70 (H) 04/01/2021    Lab Results  Component Value Date   AST 14 (L) 03/01/2021   Lab Results  Component Value Date   ALT <5 03/01/2021     Urinalysis    Component Value Date/Time   COLORURINE YELLOW (A) 03/01/2021 2245   APPEARANCEUR HAZY (A) 03/01/2021 2245   APPEARANCEUR Cloudy (A) 03/01/2021 1526   LABSPEC 1.013 03/01/2021 2245   PHURINE 5.0 03/01/2021 2245   GLUCOSEU NEGATIVE 03/01/2021 2245   HGBUR MODERATE (A) 03/01/2021 2245   BILIRUBINUR NEGATIVE 03/01/2021 2245   BILIRUBINUR Negative 03/01/2021 1526   KETONESUR 5 (A) 03/01/2021 2245   PROTEINUR 100 (A) 03/01/2021 2245   NITRITE NEGATIVE 03/01/2021 2245   LEUKOCYTESUR LARGE (A) 03/01/2021 2245  I have reviewed the labs.   Pertinent Imaging: N/A  Catheter Removal  Patient is present today for a catheter removal.  9 ml of water was drained from the balloon. A 16 FR foley cath was removed from the suprapubic site  no complications were noted . Patient tolerated well.  Assessment & Plan:    1. Urinary retention -Patient with a successful trial of void -Suprapubic tube is removed  2. BPH with retention -Continue finasteride 5 mg daily  3. Bosniak 41f left renal cyst -Follow-up MRI in June 2023  Return in about 1 month (around 11/21/2021) for I PSS .  These notes generated with voice recognition software. I apologize for typographical errors.  Zara Council, PA-C  [facility] [address]  [place],  [address] [phone]

## 2021-10-21 ENCOUNTER — Other Ambulatory Visit: Payer: Self-pay

## 2021-10-21 ENCOUNTER — Ambulatory Visit (INDEPENDENT_AMBULATORY_CARE_PROVIDER_SITE_OTHER): Payer: Medicare Other | Admitting: Urology

## 2021-10-21 ENCOUNTER — Encounter: Payer: Self-pay | Admitting: Urology

## 2021-10-21 DIAGNOSIS — R339 Retention of urine, unspecified: Secondary | ICD-10-CM | POA: Diagnosis not present

## 2021-10-21 DIAGNOSIS — N401 Enlarged prostate with lower urinary tract symptoms: Secondary | ICD-10-CM | POA: Diagnosis not present

## 2021-10-21 DIAGNOSIS — N2889 Other specified disorders of kidney and ureter: Secondary | ICD-10-CM | POA: Diagnosis not present

## 2021-10-21 DIAGNOSIS — N138 Other obstructive and reflux uropathy: Secondary | ICD-10-CM | POA: Diagnosis not present

## 2021-11-23 ENCOUNTER — Ambulatory Visit: Payer: Medicare Other | Admitting: Urology

## 2021-11-25 ENCOUNTER — Emergency Department: Payer: Medicare Other

## 2021-11-25 ENCOUNTER — Encounter: Payer: Self-pay | Admitting: Emergency Medicine

## 2021-11-25 ENCOUNTER — Emergency Department
Admit: 2021-11-25 | Discharge: 2021-11-25 | Disposition: A | Discharge status: Home / Self Care | Payer: Medicare Other | Attending: Emergency Medicine | Admitting: Emergency Medicine

## 2021-11-25 ENCOUNTER — Inpatient Hospital Stay
Admission: EM | Admit: 2021-11-25 | Discharge: 2021-11-29 | DRG: 872 | Disposition: A | Discharge status: Home / Self Care | Payer: Medicare Other | Attending: Family Medicine | Admitting: Family Medicine

## 2021-11-25 ENCOUNTER — Other Ambulatory Visit: Payer: Self-pay

## 2021-11-25 DIAGNOSIS — E785 Hyperlipidemia, unspecified: Secondary | ICD-10-CM | POA: Diagnosis present

## 2021-11-25 DIAGNOSIS — E871 Hypo-osmolality and hyponatremia: Secondary | ICD-10-CM | POA: Diagnosis not present

## 2021-11-25 DIAGNOSIS — N179 Acute kidney failure, unspecified: Secondary | ICD-10-CM

## 2021-11-25 DIAGNOSIS — I5022 Chronic systolic (congestive) heart failure: Secondary | ICD-10-CM | POA: Diagnosis present

## 2021-11-25 DIAGNOSIS — I480 Paroxysmal atrial fibrillation: Secondary | ICD-10-CM | POA: Diagnosis present

## 2021-11-25 DIAGNOSIS — R7989 Other specified abnormal findings of blood chemistry: Secondary | ICD-10-CM

## 2021-11-25 DIAGNOSIS — N10 Acute tubulo-interstitial nephritis: Secondary | ICD-10-CM | POA: Diagnosis present

## 2021-11-25 DIAGNOSIS — F02B0 Dementia in other diseases classified elsewhere, moderate, without behavioral disturbance, psychotic disturbance, mood disturbance, and anxiety: Secondary | ICD-10-CM | POA: Diagnosis present

## 2021-11-25 DIAGNOSIS — Z952 Presence of prosthetic heart valve: Secondary | ICD-10-CM | POA: Diagnosis not present

## 2021-11-25 DIAGNOSIS — D6869 Other thrombophilia: Secondary | ICD-10-CM | POA: Diagnosis not present

## 2021-11-25 DIAGNOSIS — Z85828 Personal history of other malignant neoplasm of skin: Secondary | ICD-10-CM

## 2021-11-25 DIAGNOSIS — Z841 Family history of disorders of kidney and ureter: Secondary | ICD-10-CM

## 2021-11-25 DIAGNOSIS — Q613 Polycystic kidney, unspecified: Secondary | ICD-10-CM

## 2021-11-25 DIAGNOSIS — A419 Sepsis, unspecified organism: Secondary | ICD-10-CM | POA: Diagnosis present

## 2021-11-25 DIAGNOSIS — Z66 Do not resuscitate: Secondary | ICD-10-CM | POA: Diagnosis present

## 2021-11-25 DIAGNOSIS — I5023 Acute on chronic systolic (congestive) heart failure: Secondary | ICD-10-CM | POA: Diagnosis present

## 2021-11-25 DIAGNOSIS — Z7901 Long term (current) use of anticoagulants: Secondary | ICD-10-CM

## 2021-11-25 DIAGNOSIS — Z8249 Family history of ischemic heart disease and other diseases of the circulatory system: Secondary | ICD-10-CM | POA: Diagnosis not present

## 2021-11-25 DIAGNOSIS — Z20822 Contact with and (suspected) exposure to covid-19: Secondary | ICD-10-CM | POA: Diagnosis present

## 2021-11-25 DIAGNOSIS — F32A Depression, unspecified: Secondary | ICD-10-CM | POA: Diagnosis present

## 2021-11-25 DIAGNOSIS — G20 Parkinson's disease: Secondary | ICD-10-CM | POA: Diagnosis present

## 2021-11-25 DIAGNOSIS — N12 Tubulo-interstitial nephritis, not specified as acute or chronic: Secondary | ICD-10-CM

## 2021-11-25 DIAGNOSIS — H9190 Unspecified hearing loss, unspecified ear: Secondary | ICD-10-CM | POA: Diagnosis present

## 2021-11-25 DIAGNOSIS — I10 Essential (primary) hypertension: Secondary | ICD-10-CM | POA: Diagnosis not present

## 2021-11-25 DIAGNOSIS — N1832 Chronic kidney disease, stage 3b: Secondary | ICD-10-CM | POA: Diagnosis present

## 2021-11-25 DIAGNOSIS — R6520 Severe sepsis without septic shock: Secondary | ICD-10-CM | POA: Diagnosis not present

## 2021-11-25 DIAGNOSIS — D6859 Other primary thrombophilia: Secondary | ICD-10-CM | POA: Diagnosis present

## 2021-11-25 DIAGNOSIS — N400 Enlarged prostate without lower urinary tract symptoms: Secondary | ICD-10-CM | POA: Diagnosis present

## 2021-11-25 DIAGNOSIS — R778 Other specified abnormalities of plasma proteins: Secondary | ICD-10-CM

## 2021-11-25 DIAGNOSIS — G9340 Encephalopathy, unspecified: Secondary | ICD-10-CM | POA: Diagnosis not present

## 2021-11-25 DIAGNOSIS — F0390 Unspecified dementia without behavioral disturbance: Secondary | ICD-10-CM

## 2021-11-25 DIAGNOSIS — I482 Chronic atrial fibrillation, unspecified: Secondary | ICD-10-CM | POA: Diagnosis present

## 2021-11-25 DIAGNOSIS — Z79899 Other long term (current) drug therapy: Secondary | ICD-10-CM

## 2021-11-25 DIAGNOSIS — Z951 Presence of aortocoronary bypass graft: Secondary | ICD-10-CM

## 2021-11-25 DIAGNOSIS — E860 Dehydration: Secondary | ICD-10-CM | POA: Diagnosis present

## 2021-11-25 DIAGNOSIS — G43909 Migraine, unspecified, not intractable, without status migrainosus: Secondary | ICD-10-CM | POA: Diagnosis present

## 2021-11-25 DIAGNOSIS — Z823 Family history of stroke: Secondary | ICD-10-CM

## 2021-11-25 DIAGNOSIS — I130 Hypertensive heart and chronic kidney disease with heart failure and stage 1 through stage 4 chronic kidney disease, or unspecified chronic kidney disease: Secondary | ICD-10-CM | POA: Diagnosis present

## 2021-11-25 LAB — COMPREHENSIVE METABOLIC PANEL
ALT: <5 U/L (ref 0–44)
AST: 13 U/L — ABNORMAL LOW (ref 15–41)
Albumin: 2.5 g/dL — ABNORMAL LOW (ref 3.5–5.0)
Alkaline Phosphatase: 145 U/L — ABNORMAL HIGH (ref 38–126)
Anion gap: 11 (ref 5–15)
BUN: 86 mg/dL — ABNORMAL HIGH (ref 8–23)
CO2: 22 mmol/L (ref 22–32)
Calcium: 8.4 mg/dL — ABNORMAL LOW (ref 8.9–10.3)
Chloride: 96 mmol/L — ABNORMAL LOW (ref 98–111)
Creatinine, Ser: 3.16 mg/dL — ABNORMAL HIGH (ref 0.61–1.24)
GFR, Estimated: 19 mL/min — ABNORMAL LOW (ref >60)
Glucose, Bld: 161 mg/dL — ABNORMAL HIGH (ref 70–99)
Potassium: 4.3 mmol/L (ref 3.5–5.1)
Sodium: 129 mmol/L — ABNORMAL LOW (ref 135–145)
Total Bilirubin: 1.3 mg/dL — ABNORMAL HIGH (ref 0.3–1.2)
Total Protein: 6.3 g/dL — ABNORMAL LOW (ref 6.5–8.1)

## 2021-11-25 LAB — RESP PANEL BY RT-PCR (FLU A&B, COVID) ARPGX2
Influenza A by PCR: NEGATIVE
Influenza B by PCR: NEGATIVE
SARS Coronavirus 2 by RT PCR: NEGATIVE

## 2021-11-25 LAB — CBC WITH DIFFERENTIAL/PLATELET
Abs Immature Granulocytes: 0.72 10*3/uL — ABNORMAL HIGH (ref 0.00–0.07)
Basophils Absolute: 0.1 10*3/uL (ref 0.0–0.1)
Basophils Relative: 1 %
Eosinophils Absolute: 0 10*3/uL (ref 0.0–0.5)
Eosinophils Relative: 0 %
HCT: 36.6 % — ABNORMAL LOW (ref 39.0–52.0)
Hemoglobin: 12.1 g/dL — ABNORMAL LOW (ref 13.0–17.0)
Immature Granulocytes: 3 %
Lymphocytes Relative: 2 %
Lymphs Abs: 0.4 10*3/uL — ABNORMAL LOW (ref 0.7–4.0)
MCH: 29.5 pg (ref 26.0–34.0)
MCHC: 33.1 g/dL (ref 30.0–36.0)
MCV: 89.3 fL (ref 80.0–100.0)
Monocytes Absolute: 1.1 10*3/uL — ABNORMAL HIGH (ref 0.1–1.0)
Monocytes Relative: 4 %
Neutro Abs: 21.8 10*3/uL — ABNORMAL HIGH (ref 1.7–7.7)
Neutrophils Relative %: 90 %
Platelets: 203 10*3/uL (ref 150–400)
RBC: 4.1 MIL/uL — ABNORMAL LOW (ref 4.22–5.81)
RDW: 13.9 % (ref 11.5–15.5)
WBC: 24 10*3/uL — ABNORMAL HIGH (ref 4.0–10.5)
nRBC: 0 % (ref 0.0–0.2)

## 2021-11-25 LAB — GLUCOSE, CAPILLARY
Glucose-Capillary: 169 mg/dL — ABNORMAL HIGH (ref 70–99)
Glucose-Capillary: 150 mg/dL — ABNORMAL HIGH (ref 70–99)

## 2021-11-25 LAB — URINALYSIS, COMPLETE (UACMP) WITH MICROSCOPIC
Bilirubin Urine: NEGATIVE
Glucose, UA: NEGATIVE mg/dL
Ketones, ur: NEGATIVE mg/dL
Nitrite: NEGATIVE
Protein, ur: 100 mg/dL — AB
Specific Gravity, Urine: 1.014 (ref 1.005–1.030)
Squamous Epithelial / HPF: NONE SEEN (ref 0–5)
WBC, UA: >50 WBC/hpf — ABNORMAL HIGH (ref 0–5)
pH: 5 (ref 5.0–8.0)

## 2021-11-25 LAB — ECHOCARDIOGRAM COMPLETE
AR max vel: 2.41 cm2
AV Area VTI: 2.51 cm2
AV Area mean vel: 2.29 cm2
AV Mean grad: 12 mmHg
AV Peak grad: 23.2 mmHg
Ao pk vel: 2.41 m/s
Area-P 1/2: 4.46 cm2
Height: 68 in
MV VTI: 3.09 cm2
S' Lateral: 4.23 cm
Weight: 2416.24 oz

## 2021-11-25 LAB — LACTIC ACID, PLASMA
Lactic Acid, Venous: 1.2 mmol/L (ref 0.5–1.9)
Lactic Acid, Venous: 1.3 mmol/L (ref 0.5–1.9)

## 2021-11-25 LAB — TSH: TSH: 3.23 u[IU]/mL (ref 0.350–4.500)

## 2021-11-25 LAB — TROPONIN I (HIGH SENSITIVITY)
Troponin I (High Sensitivity): 53 ng/L — ABNORMAL HIGH (ref <18)
Troponin I (High Sensitivity): 52 ng/L — ABNORMAL HIGH (ref <18)

## 2021-11-25 LAB — BRAIN NATRIURETIC PEPTIDE: B Natriuretic Peptide: 1149.9 pg/mL — ABNORMAL HIGH (ref 0.0–100.0)

## 2021-11-25 LAB — T4, FREE: Free T4: 1.12 ng/dL (ref 0.61–1.12)

## 2021-11-25 LAB — MAGNESIUM: Magnesium: 2.3 mg/dL (ref 1.7–2.4)

## 2021-11-25 LAB — LIPASE, BLOOD: Lipase: 24 U/L (ref 11–51)

## 2021-11-25 MED ORDER — LACTATED RINGERS IV BOLUS
500.0000 mL | Freq: Once | INTRAVENOUS | Status: AC
  Administered 2021-11-25: 500 mL via INTRAVENOUS

## 2021-11-25 MED ORDER — DEXTROSE 5 % IV SOLN
500.0000 mg | Freq: Once | INTRAVENOUS | Status: DC
  Filled 2021-11-25: qty 500

## 2021-11-25 MED ORDER — SODIUM CHLORIDE 0.9 % IV SOLN: INTRAVENOUS | Status: DC

## 2021-11-25 MED ORDER — CARBIDOPA-LEVODOPA ER 50-200 MG PO TBCR
1.0000 | EXTENDED_RELEASE_TABLET | Freq: Every day | ORAL | Status: DC
  Filled 2021-11-25: qty 1

## 2021-11-25 MED ORDER — FINASTERIDE 5 MG PO TABS
5.0000 mg | ORAL_TABLET | Freq: Every day | ORAL | Status: DC
  Administered 2021-11-26 – 2021-11-29 (×4): 5 mg via ORAL
  Filled 2021-11-25 (×4): qty 1

## 2021-11-25 MED ORDER — FERROUS GLUCONATE 324 (38 FE) MG PO TABS
324.0000 mg | ORAL_TABLET | Freq: Every day | ORAL | Status: DC
  Administered 2021-11-26 – 2021-11-29 (×4): 324 mg via ORAL
  Filled 2021-11-25 (×4): qty 1

## 2021-11-25 MED ORDER — SELEGILINE HCL 5 MG PO TABS
5.0000 mg | ORAL_TABLET | Freq: Two times a day (BID) | ORAL | Status: DC
  Administered 2021-11-25 – 2021-11-28 (×7): 5 mg via ORAL
  Filled 2021-11-25 (×10): qty 1

## 2021-11-25 MED ORDER — ONDANSETRON HCL 4 MG PO TABS: 4.0000 mg | ORAL_TABLET | Freq: Four times a day (QID) | ORAL | Status: DC | PRN

## 2021-11-25 MED ORDER — APIXABAN 2.5 MG PO TABS
2.5000 mg | ORAL_TABLET | Freq: Two times a day (BID) | ORAL | Status: DC
  Administered 2021-11-25 – 2021-11-29 (×8): 2.5 mg via ORAL
  Filled 2021-11-25 (×9): qty 1

## 2021-11-25 MED ORDER — PANTOPRAZOLE SODIUM 40 MG IV SOLR
40.0000 mg | Freq: Once | INTRAVENOUS | Status: AC
  Administered 2021-11-25: 40 mg via INTRAVENOUS
  Filled 2021-11-25: qty 10

## 2021-11-25 MED ORDER — PANTOPRAZOLE SODIUM 40 MG PO TBEC
40.0000 mg | DELAYED_RELEASE_TABLET | Freq: Every day | ORAL | Status: DC
  Administered 2021-11-26 – 2021-11-29 (×4): 40 mg via ORAL
  Filled 2021-11-25 (×4): qty 1

## 2021-11-25 MED ORDER — SODIUM CHLORIDE 0.9 % IV SOLN
1.0000 g | Freq: Once | INTRAVENOUS | Status: AC
  Administered 2021-11-25: 1 g via INTRAVENOUS
  Filled 2021-11-25: qty 10

## 2021-11-25 MED ORDER — MIRTAZAPINE 15 MG PO TABS: 7.5000 mg | ORAL_TABLET | Freq: Every day | ORAL | Status: DC

## 2021-11-25 MED ORDER — AMLODIPINE BESYLATE 5 MG PO TABS: 2.5000 mg | ORAL_TABLET | Freq: Every day | ORAL | Status: DC

## 2021-11-25 MED ORDER — ACETAMINOPHEN 500 MG PO TABS: 500.0000 mg | ORAL_TABLET | Freq: Every day | ORAL | Status: DC | PRN

## 2021-11-25 MED ORDER — SODIUM CHLORIDE 0.9 % IV SOLN
2.0000 g | INTRAVENOUS | Status: DC
  Administered 2021-11-26 – 2021-11-29 (×4): 2 g via INTRAVENOUS
  Filled 2021-11-25 (×2): qty 2
  Filled 2021-11-25 (×3): qty 20

## 2021-11-25 MED ORDER — CARBIDOPA-LEVODOPA 25-100 MG PO TABS
2.0000 | ORAL_TABLET | Freq: Four times a day (QID) | ORAL | Status: DC
  Administered 2021-11-25 – 2021-11-29 (×16): 2 via ORAL
  Filled 2021-11-25 (×20): qty 2

## 2021-11-25 MED ORDER — ONDANSETRON HCL 4 MG/2ML IJ SOLN: 4.0000 mg | Freq: Four times a day (QID) | INTRAMUSCULAR | Status: DC | PRN

## 2021-11-25 NOTE — ED Notes (Signed)
FIRST NURSE NOTE: [facility] sending pt over because pt had a near syncopal episode. Staff states that they were unable to get a BP on him. Pt has a hx of CHF and gave pt diuretics.  [facility] gave 500 of Rocephin

## 2021-11-25 NOTE — Progress Notes (Signed)
*  PRELIMINARY RESULTS* Echocardiogram 2D Echocardiogram has been performed.  Wallie Char Kipp Shank 11/25/2021, 1:24 PM

## 2021-11-25 NOTE — H&P (Signed)
History and Physical    Jonathon Welch EGB:[id number] DOB: [birth year] DOA: 11/25/2021  PCP: Rusty Aus, MD  Patient coming from: Home  I have personally briefly reviewed patient's old medical records in [place]  Chief Complaint: Dehydration, confusion, weakness  HPI: Jonathon Welch is a 86 y.o. male with medical history significant of of A-fib, HTN, Parkinson's, polycystic kidney disease, CKD stage IIIa, HDL, and aortic valve stenosis status post AVR as well as systolic heart failure who was sent to the ED from clinic for dehydration and possible UTI.  History was obtained from the patient's wife who is at the bedside as patient is very hard of hearing.  Despite of being hard of hearing, patient was able to answer some of the questions and he appears to be alert and oriented x3.  Patient also has moderate dementia due to Parkinson's disease.  Per history, patient went to see his PCP 3 days ago due to some intermittent confusion and weakness.  Reportedly he also had diarrhea for few days about a week ago which lasted for 2 to 3 days.  UA was done in the clinic 3 days ago and he was thought to be having a UTI but according to wife, they were not informed that he had hematuria and he was not prescribed any antibiotics.  The family thought that due to being on diuretics, he was likely dehydrated which led to the confusion and weakness so they took him back to the clinic today for IV fluids.  Reportedly he was given 500 cc of IV fluid and 500 mg of IV Rocephin.  The clinic personnel could not get blood pressure so they sent him to the emergency department for further management.  According to the wife, patient has been complaining of intermittent right as well as left lower quadrant abdominal pain but he has not had any fever or chills (however he sometimes has tremors due to Parkinson's disease),, nausea, vomiting, any problem with urination or with bowel movement.  Patient does have a history  of UTI about a year ago.  ED Course: Upon arrival to ED, patient was found to have atrial fibrillation with some tachypnea but blood pressure was fine.He was afebrile but significant leukocytosis of 24.0.  Creatinine was also elevated much higher than his baseline.  Patient was also found to have significantly elevated proBNP, no baseline known, chest x-ray showed cephalization of the pulmonary vasculature without overt pulmonary edema, patient was also tachypneic so ED physician had curb sided with cardiology and per their recommendation, a stat echo is being done when I saw this patient.  UA was consistent with a UTI and CT abdomen and pelvis without contrast shows possible left perinephric fat stranding suggestive of pyelonephritis.  Hospitalist service were consulted for admission.  Review of Systems: As per HPI otherwise negative.    Past Medical History:  Diagnosis Date   Anemia    vitamin b12 deficiency   Aortic valve disease    Atrial fibrillation ([place])    Cancer (HCC)    basal and squamous cell skin cancer   DDD (degenerative disc disease), lumbar 07/25/2016   Depression    Dysrhythmia    atrial fibrillation   Herniation through surgical site    Hyperlipidemia    Hypertension    Left inguinal hernia 2019   Migraine headache    less frequent lately   Parkinson disease ([place])    Polycystic kidney disease    Right inguinal hernia  Tremor    Ventral hernia 2019    Past Surgical History:  Procedure Laterality Date   AORTIC AND MITRAL VALVE REPLACEMENT N/A    APPENDECTOMY     ASCENDING AORTIC ANEURYSM REPAIR W/ MECHANICAL AORTIC VALVE REPLACEMENT  1985   aneurysm repaired when replaced "blown out" tissue valve   CARDIAC VALVE REPLACEMENT  1980 and 1985   aortic valve replaced, mechanical valve   CORONARY ARTERY BYPASS GRAFT  1980 and 1985   aortic valve replaced not CABG   IR CATHETER TUBE CHANGE  05/14/2021   MOHS SURGERY     nose and ear   ROBOTIC ASSISTED  LAPAROSCOPIC VENTRAL/INCISIONAL HERNIA REPAIR Right 04/06/2018   Procedure: ROBOTIC ASSISTED LAPAROSCOPIC VENTRAL/INCISIONAL HERNIA REPAIR AND RIGHT INGUINAL HERNIA;  Surgeon: Jules Husbands, MD;  Location: [facility] ORS;  Service: General;  Laterality: Right;     reports that he quit smoking about 52 years ago. His smoking use included cigarettes. He has a 22.00 pack-year smoking history. He has never used smokeless tobacco. He reports that he does not drink alcohol and does not use drugs.  No Active Allergies  Family History  Problem Relation Age of Onset   Heart disease Mother    Hypertension Mother    Stroke Father    Kidney disease Sister     Prior to Admission medications   Medication Sig Start Date End Date Taking? Authorizing Provider  acetaminophen (TYLENOL) 500 MG tablet Take 500 mg by mouth daily as needed for moderate pain or headache.    [provider]  amLODipine (NORVASC) 2.5 MG tablet Take by mouth. 08/24/20   [provider]  apixaban (ELIQUIS) 2.5 MG TABS tablet Take 2.5 mg by mouth 2 (two) times daily.    [provider]  carbidopa-levodopa (SINEMET CR) 50-200 MG tablet Take 1 tablet by mouth at bedtime.    [provider]  carbidopa-levodopa (SINEMET IR) 25-100 MG tablet Take 1.5 tablets by mouth 3 (three) times daily.  01/12/16   [provider]  CRANBERRY PO Take by mouth.    [provider]  ferrous gluconate (FERGON) 324 MG tablet Take 324 mg by mouth daily with breakfast.    [provider]  finasteride (PROSCAR) 5 MG tablet Take 1 tablet (5 mg total) by mouth daily. 03/19/21   Stoioff, Ronda Fairly, MD  furosemide (LASIX) 20 MG tablet Take 40 mg by mouth daily.    [provider]  metolazone (ZAROXOLYN) 2.5 MG tablet Take 2.5 mg by mouth as needed.    [provider]  mirtazapine (REMERON) 7.5 MG tablet Take 7.5 mg by mouth at bedtime.    [provider]  Misc Natural Products ([facility]  PROSTATE MAX + PO) Take by mouth. beta-sitos/D3/minerals/cranber (PROSTATE MAX PLUS ORAL)    [provider]  modafinil (PROVIGIL) 100 MG tablet Take by mouth. Patient not taking: Reported on 10/21/2021 07/07/21 07/07/22  [provider]  omeprazole (PRILOSEC) 40 MG capsule Take 1 capsule by mouth daily. 02/22/21   [provider]  selegiline (ELDEPRYL) 5 MG capsule Take 5 mg by mouth 2 (two) times daily.  06/07/16   [provider]    Physical Exam: Vitals:   11/25/21 1200 11/25/21 1215 11/25/21 1232 11/25/21 1300  BP: (!) 173/83   137/80  Pulse:  (!) 43 67 (!) 108  Resp: (!) 24 (!) 24 (!) 22 (!) 22  Temp:      TempSrc:      SpO2:  95% 96% 95%  Weight:      Height:        Constitutional: NAD, calm, comfortable Vitals:   11/25/21 1200 11/25/21 1215 11/25/21 1232 11/25/21 1300  BP: (!) 173/83   137/80  Pulse:  (!) 43 67 (!) 108  Resp: (!) 24 (!) 24 (!) 22 (!) 22  Temp:      TempSrc:      SpO2:  95% 96% 95%  Weight:      Height:       Eyes: PERRL, lids and conjunctivae normal ENMT: Mucous membranes are moist. Posterior pharynx clear of any exudate or lesions.Normal dentition.  Neck: normal, supple, no masses, no thyromegaly Respiratory: clear to auscultation bilaterally, no wheezing, no crackles. Normal respiratory effort. No accessory muscle use.  Cardiovascular: Regular rate and rhythm, no murmurs / rubs / gallops. No extremity edema. 2+ pedal pulses. No carotid bruits.  Abdomen: no tenderness, no masses palpated. No hepatosplenomegaly. Bowel sounds positive.  Musculoskeletal: no clubbing / cyanosis. No joint deformity upper and lower extremities. Good ROM, no contractures. Normal muscle tone.  Skin: no rashes, lesions, ulcers. No induration Neurologic: CN 2-12 grossly intact. Sensation intact, DTR normal. Strength 5/5 in all 4.  Psychiatric: Normal judgment and insight. Alert and oriented x 3. Normal mood.    Labs on Admission: I have  personally reviewed following labs and imaging studies  CBC: Recent Labs  Lab 11/25/21 0946  WBC 24.0*  NEUTROABS 21.8*  HGB 12.1*  HCT 36.6*  MCV 89.3  PLT 353   Basic Metabolic Panel: Recent Labs  Lab 11/25/21 0946  NA 129*  K 4.3  CL 96*  CO2 22  GLUCOSE 161*  BUN 86*  CREATININE 3.16*  CALCIUM 8.4*  MG 2.3   GFR: Estimated Creatinine Clearance: 17.1 mL/min (A) (by C-G formula based on SCr of 3.16 mg/dL (H)). Liver Function Tests: Recent Labs  Lab 11/25/21 0946  AST 13*  ALT <5  ALKPHOS 145*  BILITOT 1.3*  PROT 6.3*  ALBUMIN 2.5*   Recent Labs  Lab 11/25/21 0946  LIPASE 24   No results for input(s): AMMONIA in the last 168 hours. Coagulation Profile: No results for input(s): INR, PROTIME in the last 168 hours. Cardiac Enzymes: No results for input(s): CKTOTAL, CKMB, CKMBINDEX, TROPONINI in the last 168 hours. BNP (last 3 results) No results for input(s): PROBNP in the last 8760 hours. HbA1C: No results for input(s): HGBA1C in the last 72 hours. CBG: No results for input(s): GLUCAP in the last 168 hours. Lipid Profile: No results for input(s): CHOL, HDL, LDLCALC, TRIG, CHOLHDL, LDLDIRECT in the last 72 hours. Thyroid Function Tests: Recent Labs    11/25/21 0946  TSH 3.230  FREET4 1.12   Anemia Panel: No results for input(s): VITAMINB12, FOLATE, FERRITIN, TIBC, IRON, RETICCTPCT in the last 72 hours. Urine analysis:    Component Value Date/Time   COLORURINE YELLOW (A) 11/25/2021 1227   APPEARANCEUR TURBID (A) 11/25/2021 1227   APPEARANCEUR Cloudy (A) 03/01/2021 1526   LABSPEC 1.014 11/25/2021 1227   PHURINE 5.0 11/25/2021 1227   GLUCOSEU NEGATIVE 11/25/2021 1227   HGBUR SMALL (A) 11/25/2021 1227   BILIRUBINUR NEGATIVE 11/25/2021 1227   BILIRUBINUR Negative 03/01/2021 [place] 11/25/2021 1227   PROTEINUR 100 (A) 11/25/2021 1227   NITRITE NEGATIVE 11/25/2021 1227   LEUKOCYTESUR MODERATE (A) 11/25/2021 1227     Radiological Exams on Admission: CT ABDOMEN PELVIS WO CONTRAST  Result Date: 11/25/2021 CLINICAL DATA:  Abdominal  pain EXAM: CT ABDOMEN AND PELVIS WITHOUT CONTRAST TECHNIQUE: Multidetector CT imaging of the abdomen and pelvis was performed following the standard protocol without IV contrast. RADIATION DOSE REDUCTION: This exam was performed according to the departmental dose-optimization program which includes automated exposure control, adjustment of the mA and/or kV according to patient size and/or use of iterative reconstruction technique. COMPARISON:  CT abdomen and pelvis dated Mar 16, 2020; MRI abdomen dated April 09, 2021 FINDINGS: Lower chest: Cardiomegaly with coronary artery and mitral annular calcifications. Small bilateral pleural effusions and atelectasis. Hepatobiliary: Numerous low-attenuation lesions are seen throughout the liver, but most pronounced in the left lobe of the liver, unchanged compared to prior and likely simple cysts. Cholelithiasis with no gallbladder wall thickening. Cystic lesion of the head of the pancreas measuring 2.0 cm Pancreas: Unchanged pancreatic head cyst measuring 2.1 cm. No peripancreatic inflammation. Spleen: Normal in size without focal abnormality. Adrenals/Urinary Tract: Bilateral adrenal glands are unremarkable. New left perinephric fat stranding. A complex cyst of the lower pole of the left kidney is decreased in size when compared with prior exam, measuring up to 2.0 cm on series 5, image 32, previously measured up to 5.1 cm. Additional numerous cystic lesions are seen throughout the bilateral kidneys, most are compatible with simple cysts, others demonstrate high density, likely due to proteinaceous material. Bladder is unremarkable. Stomach/Bowel: Small hiatal hernia. Stomach is otherwise unremarkable. No bowel wall thickening, inflammatory change or evidence of obstruction. Appendix is not definitely visualized, although there are no secondary findings of  acute appendicitis. Vascular/Lymphatic: Aortic atherosclerosis. No enlarged abdominal or pelvic lymph nodes. Reproductive: Prostate is unremarkable. Other: Small umbilical hernia containing fat and fluid. Trace pelvic free fluid. Musculoskeletal: No acute or significant osseous findings. IMPRESSION: 1. New left perinephric fat stranding. A complex cyst of the lower pole the left kidney is decreased in size when compared with prior exam. Findings may be due to ruptured cyst, although infection is an additional consideration. Recommend correlation with urinalysis. 2. Findings compatible with polycystic kidney disease including numerous hepatic and renal cysts. 3. Stable cystic lesion of the head of the pancreas, previously evaluated on prior MRI. 4.  Aortic Atherosclerosis (ICD10-I70.0). Electronically Signed   By: Yetta Glassman M.D.   On: 11/25/2021 10:32   DG Chest Portable 1 View  Result Date: 11/25/2021 CLINICAL DATA:  Congestive heart failure. Hypertensive earlier today. History of CABG and aortic valve replacement. EXAM: PORTABLE CHEST 1 VIEW COMPARISON:  AP chest 03/01/2021 FINDINGS: Status post median sternotomy. Mildly to moderately enlarged cardiac silhouette, unchanged. Likely mitral annular calcification. Mild calcification within aortic arch. Mildly decreased lung volumes with bronchovascular crowding. Cephalization of the pulmonary vasculature without overt pulmonary edema. No pleural effusion or pneumothorax. No acute skeletal abnormality. IMPRESSION: Cephalization of the pulmonary vasculature without overt pulmonary edema. Electronically Signed   By: Yvonne Kendall M.D.   On: 11/25/2021 09:51    EKG: Independently reviewed.  EKG shows left bundle branch block.  Assessment/Plan Principal Problem:   Sepsis ([place]) Active Problems:   Benign essential HTN   BPH (benign prostatic hyperplasia)   Acute pyelonephritis   Sepsis secondary to left-sided pyelonephritis in a patient with  cholecystectomy disease: Patient meets sepsis criteria based on tachypnea and leukocytosis.  Patient received 500 mg of Rocephin in the clinic and then 500 mg of additional Rocephin was given here.  I will start him on 2 g sodium daily per sepsis protocol recommendations.  He has received 1 L of fluid in the ED,  due to history of CHF.  I will start him on gentle hydration at 50 cc/h.  Urine culture and blood culture as collected.  Lactic acid normal.  Paroxysmal atrial fibrillation: Rates fairly controlled.  He is not on any rate control medications.  Resume home dose of Eliquis.  Reported history of systolic congestive heart failure: No previous echo available in the records to check.  Some vascular congestion.  Elevated BNP.  He is not hypoxic.  Lungs clear to auscultation.  Echo pending.  We will follow-up.  AKI on CKD stage IIIa: His baseline creatinine is around 1.4 with a GFR of 49.  His creatinine today is 3.16.  This is likely in the setting of sepsis as well as dehydration.  Holding his home Lasix and Zaroxolyn.  Continue gentle hydration.  History of Parkinson's disease with moderate dementia: Resume home medications.  Hypertension: Blood pressure fairly stable.  Resume home dose of amlodipine 2.5 mg.  History of aortic stenosis s/p TAVR and [place] in 1980: Currently no symptoms.  Echo pending  BPH: Continue home dose of finasteride.  DVT prophylaxis: apixaban (ELIQUIS) tablet 2.5 mg Start: 11/25/21 1330 Code Status: DNR, confirmed with wife Family Communication: Wife present at bedside.  Plan of care discussed with patient in length and he verbalized understanding and agreed with it. Disposition Plan: Home versus SNF when medically stable Consults called: Cardiology curb sided by ED  Darliss Cheney MD [facility] Please page via [place] and do not message via secure chat for urgent patient care matters. Secure chat can be used for non urgent patient care  matters. 11/25/2021, 1:33 PM  To contact the attending provider between 7A-7P or the covering provider during after hours 7P-7A, please log into the web site [email or web address]

## 2021-11-25 NOTE — Sepsis Progress Note (Signed)
Sepsis protocol monitored by eLink 

## 2021-11-25 NOTE — ED Provider Notes (Signed)
[facility] Provider Note    Event Date/Time   First MD Initiated Contact with Patient 11/25/21 [phone]     (approximate)   History   Hypotension   HPI  Jonathon Welch is a 86 y.o. male with a past medical history of A-fib, HTN, Parkinson's, polycystic kidney disease, HDL, and aortic valve stenosis status post AVR as well as systolic heart failure who presents after being referred from clinic for further evaluation of status post TAVR and some what sounds like baseline cognitive deficits thought to be related to his Parkinson disease he presents accompanied by his son for assessment of several concerns.  They referred to the emergency room from clinic where they initially went as they were unable to obtain a blood pressure in clinic but patient did receive 500 mg of Rocephin for UTI.  It seems that the urine was sent on 2/6 and some blood work and the UA at that time showed findings concerning for cystitis.  Patient also has had very little p.o. intake in the week prior had been on Lasix with concerns for some volume overload.  He also had some diarrhea last week and his son's concerns that he was possibly over diuresed in the setting of diarrhea became dehydrated and so they are also planning to get some IV fluids in clinic today but they did not get any yet.  He notes that he has not had any diarrhea last couple days although had a large black bowel movement last night.  Patient reportedly has been intermittently complaining of some abdominal pain the left day or so.  He has not had any reported cough, fevers, complaints of chest pain, headache, urinary symptoms or recent falls or injuries person is history directly from the patient is fairly limited.  Person he does have a remote history of a GI bleed but is not exactly sure where this was from.  He is not on any NSAIDs.  He is on Eliquis      Physical Exam  Triage Vital Signs: ED Triage Vitals  Enc Vitals Group      BP 11/25/21 0858 111/77     Pulse Rate 11/25/21 0858 (!) 45     Resp 11/25/21 0858 20     Temp 11/25/21 0858 98 F (36.7 C)     Temp Source 11/25/21 0858 Oral     SpO2 11/25/21 0858 94 %     Weight 11/25/21 0900 151 lb 0.2 oz (68.5 kg)     Height 11/25/21 0900 5\' 8"  (1.727 m)     Head Circumference --      Peak Flow --      Pain Score 11/25/21 0900 8     Pain Loc --      Pain Edu? --      Excl. in [place]? --     Most recent vital signs: Vitals:   11/25/21 1232 11/25/21 1300  BP:  137/80  Pulse: 67 (!) 108  Resp: (!) 22 (!) 22  Temp:    SpO2: 96% 95%    General: Awake, no distress.  CV:  Systolic and diastolic murmurs.  Normal rate slightly irregular rhythm.  Prolonged capillary refill. Resp:  Normal effort.  Clear bilaterally Abd:  No distention.  Slightly distended but soft throughout. Other:  Very mild edema in the lower extremities which time bedside notes is less than usual swelling.  Patient is pleasantly confused and seeming very hard of hearing which patient's son  at bedside states is baseline.   ED Results / Procedures / Treatments  Labs (all labs ordered are listed, but only abnormal results are displayed) Labs Reviewed  COMPREHENSIVE METABOLIC PANEL - Abnormal; Notable for the following components:      Result Value   Sodium 129 (*)    Chloride 96 (*)    Glucose, Bld 161 (*)    BUN 86 (*)    Creatinine, Ser 3.16 (*)    Calcium 8.4 (*)    Total Protein 6.3 (*)    Albumin 2.5 (*)    AST 13 (*)    Alkaline Phosphatase 145 (*)    Total Bilirubin 1.3 (*)    GFR, Estimated 19 (*)    All other components within normal limits  CBC WITH DIFFERENTIAL/PLATELET - Abnormal; Notable for the following components:   WBC 24.0 (*)    RBC 4.10 (*)    Hemoglobin 12.1 (*)    HCT 36.6 (*)    Neutro Abs 21.8 (*)    Lymphs Abs 0.4 (*)    Monocytes Absolute 1.1 (*)    Abs Immature Granulocytes 0.72 (*)    All other components within normal limits  BRAIN NATRIURETIC  PEPTIDE - Abnormal; Notable for the following components:   B Natriuretic Peptide 1,149.9 (*)    All other components within normal limits  URINALYSIS, COMPLETE (UACMP) WITH MICROSCOPIC - Abnormal; Notable for the following components:   Color, Urine YELLOW (*)    APPearance TURBID (*)    Hgb urine dipstick SMALL (*)    Protein, ur 100 (*)    Leukocytes,Ua MODERATE (*)    WBC, UA >50 (*)    Bacteria, UA MANY (*)    Non Squamous Epithelial PRESENT (*)    All other components within normal limits  TROPONIN I (HIGH SENSITIVITY) - Abnormal; Notable for the following components:   Troponin I (High Sensitivity) 53 (*)    All other components within normal limits  TROPONIN I (HIGH SENSITIVITY) - Abnormal; Notable for the following components:   Troponin I (High Sensitivity) 52 (*)    All other components within normal limits  RESP PANEL BY RT-PCR (FLU A&B, COVID) ARPGX2  CULTURE, BLOOD (ROUTINE X 2)  CULTURE, BLOOD (ROUTINE X 2)  URINE CULTURE  TSH  T4, FREE  MAGNESIUM  LIPASE, BLOOD  LACTIC ACID, PLASMA  LACTIC ACID, PLASMA     EKG  EKG is remarkable for appears to be a sinus rhythm with sinus arrhythmia with left bundle branch block and a QTc interval of 537 with some nonspecific ST changes in inferior and lateral leads without other clear evidence of acute ischemia.  RADIOLOGY  Chest x-ray interpreted by myself shows some cephalization without overt edema and small stable appearing cardiomegaly.  No focal consolidation, pneumothorax unlikely acute process.  Also reviewed radiology interpretation and agree with the findings.  CT abdomen pelvis on my interpretation shows left-sided perinephric stranding for significant cyst burden bilaterally.  No other clear acute process on my interpretation.  I also reviewed radiology's findings and agree with the interpretation including evidence of aortic atherosclerosis and no other clear acute process.   PROCEDURES:  Critical Care  performed: Yes, see critical care procedure note(s)  .Critical Care Performed by: Lucrezia Starch, MD Authorized by: Lucrezia Starch, MD   Critical care provider statement:    Critical care time (minutes):  30   Critical care was necessary to treat or prevent imminent or life-threatening deterioration of the  following conditions:  Sepsis   Critical care was time spent personally by me on the following activities:  Development of treatment plan with patient or surrogate, discussions with consultants, evaluation of patient's response to treatment, examination of patient, ordering and review of laboratory studies, ordering and review of radiographic studies, ordering and performing treatments and interventions, pulse oximetry, re-evaluation of patient's condition and review of old [place] ED: Medications  lactated ringers bolus 500 mL (has no administration in time range)  cefTRIAXone (ROCEPHIN) 500 mg in dextrose 5 % 50 mL IVPB (has no administration in time range)  lactated ringers bolus 500 mL (has no administration in time range)  pantoprazole (PROTONIX) injection 40 mg (40 mg Intravenous Given 11/25/21 0948)     IMPRESSION / MDM / [place] / ED COURSE  I reviewed the triage vital signs and the nursing notes.                               Patient was initially sent to emergency room because they were unable to obtain a blood pressure in clinic earlier today patient was scheduled to get some IV fluids with concern for acute kidney injury seen on labs drawn on 2/6 and where he received a dose of Rocephin for UTI diagnosed from the urine sent on 2 6.  On arrival to emergency room here who has a normal blood pressure is afebrile and hemodynamically stable.  He does appear slightly dry.  Overall constellation of symptoms is concerning for dehydration possibly in the setting of overdiuresis last week in the setting of some diarrhea last week as well as a  possible GI bleed given history of black stools yesterday.  In addition patient has a history of GI bleeding and is on Eliquis.  His belly is soft he reported he has been compliant with abdominal discomfort as well so it is possible he is experiencing ongoing symptoms related to cystitis having only started antibiotic today versus other acute abdominal process.  EKG is remarkable for appears to be a sinus rhythm with sinus arrhythmia with left bundle branch block and a QTc interval of 537 with some nonspecific ST changes in inferior and lateral leads without other clear evidence of acute ischemia.  Chest x-ray interpreted by myself shows some cephalization without overt edema and small stable appearing cardiomegaly.  No focal consolidation, pneumothorax unlikely acute process.  Also reviewed radiology interpretation and agree with the findings.  CT abdomen pelvis on my interpretation shows left-sided perinephric stranding for significant cyst burden bilaterally.  No other clear acute process on my interpretation.  I also reviewed radiology's findings and agree with the interpretation including evidence of aortic atherosclerosis and no other clear acute process.  CMP is remarkable for sodium of 129, glucose of 161, BUN of 86 and a creatinine of 3.16 compared to 1.77 months ago without other significant electrolyte or metabolic derangements.  CBC shows WBC count of 24,000 and hemoglobin of 12.1.  TSH, free T4 and ECMO are within normal limits.  Troponin is elevated but stable at 53 and 52 concerning for demand ischemia with lower suspicion for occlusion of LAD at this time.  COVID influenza PCR is negative.  Lipase not consistent pancreatitis.  Lactic acid is not elevated.  UA appears infected with many bacteria and greater than 50 WBCs with moderate leuks.  BNP is fairly elevated at 1149.  Given findings  on x-ray of possible early edema this does complicate patient's overall volume status as he appears  dehydrated on exam.  I discussed this with on-call cardiologist Dr. Saralyn Pilar recommended obtaining an echo and gentle fluid hydration as patient appears dry and had evidence of an AKI which he does.  Given intermittent tachypnea and leukocytosis with concerns for pyelonephritis and concern for sepsis.  Blood cultures ordered and patient given additional IV antibiotics.  He was started on gentle fluids hydration.  I will admit to medicine service for further evaluation management.  I discussed this with admitting hospitalist.  Echo ordered.      FINAL CLINICAL IMPRESSION(S) / ED DIAGNOSES   Final diagnoses:  Pyelonephritis  Troponin I above reference range  AKI (acute kidney injury) ([place])     Rx / DC Orders   ED Discharge Orders     None        Note:  This document was prepared using Dragon voice recognition software and may include unintentional dictation errors.   Lucrezia Starch, MD 11/25/21 1316

## 2021-11-25 NOTE — ED Notes (Signed)
I&O cath performed- pt had yellow with precipitates noted that turned into chunky, white drainage that clogged the in and out tube

## 2021-11-25 NOTE — Progress Notes (Signed)
CODE SEPSIS - PHARMACY COMMUNICATION  **Broad Spectrum Antibiotics should be administered within 1 hour of Sepsis diagnosis**  Time Code Sepsis Called/Page Received: 1310  Antibiotics Ordered: ceftriaxone  Time of 1st antibiotic administration: 1651  Additional action taken by pharmacy: messaged RN   Tawnya Crook, PharmD, BCPS Clinical Pharmacist 11/25/2021 1:58 PM

## 2021-11-25 NOTE — ED Notes (Signed)
Assisted pt to stand and attempted to use urinal- pt unable to provide urine sample at this time- instructed son to tell this RN if pt was ready to try again

## 2021-11-25 NOTE — ED Triage Notes (Signed)
Pt comes into the ED VIA [facility] clinic c/o being unable to get a blood pressure reading on him this morning.  Pt is a CHF patient.  Pt does admit to stomach pain.  Last BM was 1 nights ago.  Pt also explains he has some SHOB, but states its no worse than normal.

## 2021-11-25 NOTE — ED Notes (Signed)
ECHO at bedside.

## 2021-11-26 DIAGNOSIS — D6869 Other thrombophilia: Secondary | ICD-10-CM

## 2021-11-26 DIAGNOSIS — N1832 Chronic kidney disease, stage 3b: Secondary | ICD-10-CM

## 2021-11-26 DIAGNOSIS — Q613 Polycystic kidney, unspecified: Secondary | ICD-10-CM

## 2021-11-26 DIAGNOSIS — N179 Acute kidney failure, unspecified: Secondary | ICD-10-CM

## 2021-11-26 DIAGNOSIS — I5023 Acute on chronic systolic (congestive) heart failure: Secondary | ICD-10-CM

## 2021-11-26 LAB — COMPREHENSIVE METABOLIC PANEL
ALT: <5 U/L (ref 0–44)
AST: 10 U/L — ABNORMAL LOW (ref 15–41)
Albumin: 2.3 g/dL — ABNORMAL LOW (ref 3.5–5.0)
Alkaline Phosphatase: 170 U/L — ABNORMAL HIGH (ref 38–126)
Anion gap: 10 (ref 5–15)
BUN: 91 mg/dL — ABNORMAL HIGH (ref 8–23)
CO2: 22 mmol/L (ref 22–32)
Calcium: 8.3 mg/dL — ABNORMAL LOW (ref 8.9–10.3)
Chloride: 100 mmol/L (ref 98–111)
Creatinine, Ser: 2.99 mg/dL — ABNORMAL HIGH (ref 0.61–1.24)
GFR, Estimated: 20 mL/min — ABNORMAL LOW (ref >60)
Glucose, Bld: 147 mg/dL — ABNORMAL HIGH (ref 70–99)
Potassium: 3.9 mmol/L (ref 3.5–5.1)
Sodium: 132 mmol/L — ABNORMAL LOW (ref 135–145)
Total Bilirubin: 1 mg/dL (ref 0.3–1.2)
Total Protein: 5.8 g/dL — ABNORMAL LOW (ref 6.5–8.1)

## 2021-11-26 LAB — PROCALCITONIN: Procalcitonin: 4.65 ng/mL

## 2021-11-26 LAB — CBC
HCT: 36.8 % — ABNORMAL LOW (ref 39.0–52.0)
Hemoglobin: 12.2 g/dL — ABNORMAL LOW (ref 13.0–17.0)
MCH: 29.5 pg (ref 26.0–34.0)
MCHC: 33.2 g/dL (ref 30.0–36.0)
MCV: 89.1 fL (ref 80.0–100.0)
Platelets: 240 10*3/uL (ref 150–400)
RBC: 4.13 MIL/uL — ABNORMAL LOW (ref 4.22–5.81)
RDW: 13.9 % (ref 11.5–15.5)
WBC: 20.2 10*3/uL — ABNORMAL HIGH (ref 4.0–10.5)
nRBC: 0 % (ref 0.0–0.2)

## 2021-11-26 LAB — CORTISOL-AM, BLOOD: Cortisol - AM: 32 ug/dL — ABNORMAL HIGH (ref 6.7–22.6)

## 2021-11-26 LAB — PROTIME-INR
INR: 2.2 — ABNORMAL HIGH (ref 0.8–1.2)
Prothrombin Time: 24.4 seconds — ABNORMAL HIGH (ref 11.4–15.2)

## 2021-11-26 MED ORDER — AMLODIPINE BESYLATE 5 MG PO TABS
2.5000 mg | ORAL_TABLET | Freq: Two times a day (BID) | ORAL | Status: DC
  Administered 2021-11-26 – 2021-11-29 (×6): 2.5 mg via ORAL
  Filled 2021-11-26 (×6): qty 1

## 2021-11-26 NOTE — Assessment & Plan Note (Addendum)
No dyspnea or swelling or orthopnea - Hold Lasix and metolazone

## 2021-11-26 NOTE — Hospital Course (Signed)
Mr. Jonathon Welch is an 86 y.o. 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  Initially, developed SOB, peripheral edema, 10lb weight gain about 1 month ago.  Treated with oral Lasix, at PCP's office, edema seemed to resolve.  Then in last week, developed sluggishness, weakness.  Went to PCP's office, Cr up to 3.3, nursing unable to obtain BP, sent to the ER.  Noted to have recent culture of urine with Citrobacter.  In the ER, BP normal.  CT abdomen and pelvis consistent with UTI.  BNP >1000.  CXR with cephalization of vessels, no overt edema.

## 2021-11-26 NOTE — Progress Notes (Signed)
°  Progress Note   Patient: Jonathon Welch BWG:[id number] DOB: 18-Mar-1936 DOA: 11/25/2021     1 DOS: the patient was seen and examined on 11/26/2021   Brief hospital course: Jonathon Welch is an 86 y.o. 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  Initially, developed SOB, peripheral edema, 10lb weight gain about 1 month ago.  Treated with oral Lasix, at PCP's office, edema seemed to resolve.  Then in last week, developed sluggishness, weakness.  Went to PCP's office, Cr up to 3.3, nursing unable to obtain BP, sent to the ER.  Noted to have recent culture of urine with Citrobacter.  In the ER, BP normal.  CT abdomen and pelvis consistent with UTI.  BNP >1000.  CXR with cephalization of vessels, no overt edema.    Assessment and Plan: * Sepsis (Jonathon Welch)- (present on admission) - Continue Rocephin  Acute pyelonephritis- (present on admission) - See above  Acute renal failure superimposed on stage 3b chronic kidney disease ([place]) - Continue IV fluids -Consult nephrology - Hold diuretics  Acute on chronic systolic CHF (congestive heart failure) (HCC) - Hold diuretics  Stage 3b chronic kidney disease (CKD) (HCC) Due to polycystic kidney disease, now with AKI  Acquired thrombophilia (HCC) - Continue apixaban  Hypertension- (present on admission) Blood pressure labile - Continue amlodipine   Parkinson's disease ([place])- (present on admission) - Continue Sinemet, selegiline  BPH (benign prostatic hyperplasia)- (present on admission) - Continue finasteride  Benign essential HTN- (present on admission) Blood pressure labile - Continue amlodipine  Chronic atrial fibrillation ([place])- (present on admission) Heart rate controlled - Continue apixaban        Subjective: Seems to be  getting better.  No vomiting, no fever.  Physical Exam: Vitals:   11/25/21 2328 11/25/21 2330 11/26/21 0251 11/26/21 0749  BP: (!) 93/55 (!) 95/58 114/73 129/72  Pulse: 72 71 77 80  Resp: 18  18 15   Temp: 98.3 F (36.8 C)  98.4 F (36.9 C) 97.7 F (36.5 C)  TempSrc: Oral  Oral   SpO2: 91% 91% 94% 96%  Weight:      Height:       Elderly adult male, lying in bed, no obvious distress Heart rate regular, no murmurs, no lower extremity edema , Respiratory rate and rhythm, lungs without rales or wheezes Abdomen soft tenderness palpation in the right side. Awake and alert, seems moderately impaired by dementia  Data Reviewed: My review of labs and imaging is notable for TSH normal, COVID-negative, echo with stable EF 30 to 35%, CT abdomen pelvis with perinephric fat stranding around the kidney EKG with A-fib, IVCD, no change from previous, INR 2.2 White blood cell count on the 20   Family Communication: Wife and son at the bedside  Disposition: Status is: Inpatient Remains inpatient appropriate because: Requires IV antibiotics on an ongoing basis, likely SNF          Planned Discharge Destination: Home      Author: Edwin Dada, MD 11/26/2021 5:39 PM  For on call review [email or web address].

## 2021-11-26 NOTE — Evaluation (Signed)
Physical Therapy Evaluation Patient Details Name: Jonathon Welch MRN: [id number] DOB: [birth year] Today's Date: 11/26/2021  History of Present Illness  The patient is an 86 yo male that presented to ED from clinic due to dehydration and possible UTI. Work up showed sepsis, a UTI and CT abdomen and pelvis without contrast shows possible left perinephric fat stranding suggestive of pyelonephritis.  PMH of A-fib, HTN, Parkinson's, polycystic kidney disease, CKD stage IIIa, HDL, and aortic valve stenosis status post AVR as well as systolic heart failure.   Clinical Impression  Patient alert upon PT entrance, denied pain, oriented x4 (did know month/year). Pt did exhibit increased fatigue/lethargy after activity/orthostatic vitals. The patient/family reported at baseline he lives in a two story home, has had a lot of falls, normally does not want to use AD. Son assists with IADLs.   The patient was able to perform bed mobility with minA, extended time. Good sitting balance noted. Sit <> Stand with RW and CGA-minA, performed several times. Pt with strong posterior lean, but able to correct with cueing, and demonstrated decreased safety awareness of UE placement on RW. He was able to transfer to the recliner with CGA, and then ambulated ~96ft with RW and CGA, close chair follow. Pt reported feeling weak, wobbly, and short of breath activity, noted to be orthostatic.  Overall the patient demonstrated deficits (see "PT Problem List") that impede the patient's functional abilities, safety, and mobility and would benefit from skilled PT intervention. Recommendation is SNF due to current level of assistance needed and decreased caregiver support.         Recommendations for follow up therapy are one component of a multi-disciplinary discharge planning process, led by the attending physician.  Recommendations may be updated based on patient status, additional functional criteria and insurance  authorization.  Follow Up Recommendations Skilled nursing-short term rehab (<3 hours/day)    Assistance Recommended at Discharge    Patient can return home with the following  A little help with walking and/or transfers;A little help with bathing/dressing/bathroom;Assistance with cooking/housework;Direct supervision/assist for medications management;Help with stairs or ramp for entrance;Assist for transportation    Equipment Recommendations None recommended by PT  Recommendations for Other Services       Functional Status Assessment Patient has had a recent decline in their functional status and demonstrates the ability to make significant improvements in function in a reasonable and predictable amount of time.     Precautions / Restrictions Precautions Precautions: Fall Restrictions Weight Bearing Restrictions: No      Mobility  Bed Mobility Overal bed mobility: Needs Assistance Bed Mobility: Supine to Sit     Supine to sit: Min assist, HOB elevated     General bed mobility comments: light handheld assist to complete trunk elevation    Transfers Overall transfer level: Needs assistance Equipment used: Rolling walker (2 wheels) Transfers: Sit to/from Stand, Bed to chair/wheelchair/BSC Sit to Stand: Min guard, Min assist   Step pivot transfers: Min guard            Ambulation/Gait   Gait Distance (Feet): 60 Feet Assistive device: Rolling walker (2 wheels)         General Gait Details: decreased cadence, reported feeling wobbly, after did endorse feeling short of breath, appeared syncopal  Stairs            Wheelchair Mobility    Modified Rankin (Stroke Patients Only)       Balance Overall balance assessment: Needs assistance Sitting-balance support: Feet supported Sitting  balance-Leahy Scale: Good     Standing balance support: Reliant on assistive device for balance Standing balance-Leahy Scale: Fair Standing balance comment: able to  statically stand without UE support                             Pertinent Vitals/Pain Pain Assessment Pain Assessment: No/denies pain    Home Living Family/patient expects to be discharged to:: Private residence Living Arrangements: Spouse/significant other Available Help at Discharge: Available 24 hours/day Type of Home: House Home Access: Stairs to enter   [employer] of Steps: 2, grab bar mounted on wall by the stairs on the R that he can reach   Home Layout: Able to live on main level with bedroom/bathroom;Two level Home Equipment: Grab bars - tub/shower;BSC/3in1;Rolling Walker (2 wheels);Rollator (4 wheels) Additional Comments: pt reported many falls recently    Prior Function Prior Level of Function : Needs assist       Physical Assist : Mobility (physical);ADLs (physical) Mobility (physical): Bed mobility;Transfers ADLs (physical): IADLs Mobility Comments: has a rollator and a RW, but didn't normally use it; except for the last week or so       Hand Dominance   Dominant Hand: Right    Extremity/Trunk Assessment   Upper Extremity Assessment Upper Extremity Assessment: Defer to OT evaluation    Lower Extremity Assessment Lower Extremity Assessment: Generalized weakness    Cervical / Trunk Assessment Cervical / Trunk Assessment: Kyphotic  Communication   Communication: No difficulties;HOH  Cognition Arousal/Alertness: Awake/alert Behavior During Therapy: WFL for tasks assessed/performed Overall Cognitive Status: Within Functional Limits for tasks assessed                                          General Comments      Exercises     Assessment/Plan    PT Assessment Patient needs continued PT services  PT Problem List Decreased strength;Decreased mobility;Decreased range of motion;Decreased knowledge of precautions;Decreased activity tolerance;Decreased balance;Decreased knowledge of use of DME;Decreased safety  awareness       PT Treatment Interventions DME instruction;Therapeutic exercise;Gait training;Balance training;Stair training;Neuromuscular re-education;Functional mobility training;Patient/family education;Therapeutic activities    PT Goals (Current goals can be found in the Care Plan section)  Acute Rehab PT Goals Patient Stated Goal: to feel better PT Goal Formulation: With patient Time For Goal Achievement: 12/10/21 Potential to Achieve Goals: Good    Frequency Min 2X/week     Co-evaluation               AM-PAC PT "6 Clicks" Mobility  Outcome Measure Help needed turning from your back to your side while in a flat bed without using bedrails?: A Little Help needed moving from lying on your back to sitting on the side of a flat bed without using bedrails?: A Little Help needed moving to and from a bed to a chair (including a wheelchair)?: A Little Help needed standing up from a chair using your arms (e.g., wheelchair or bedside chair)?: A Little Help needed to walk in hospital room?: A Little Help needed climbing 3-5 steps with a railing? : A Lot 6 Click Score: 17    End of Session Equipment Utilized During Treatment: Gait belt Activity Tolerance: Other (comment);Patient limited by fatigue (limited by orthostatic hypotension) Patient left: in chair (with OT) Nurse Communication: Mobility status PT Visit Diagnosis:  Other abnormalities of gait and mobility (R26.89);Difficulty in walking, not elsewhere classified (R26.2);Muscle weakness (generalized) (M62.81)    Time: 0964-3838 PT Time Calculation (min) (ACUTE ONLY): 29 min   Charges:   PT Evaluation $PT Eval Low Complexity: 1 Low PT Treatments $Therapeutic Exercise: 8-22 mins $Therapeutic Activity: 8-22 mins        Lieutenant Diego PT, DPT 12:25 PM,11/26/21

## 2021-11-26 NOTE — Evaluation (Signed)
Occupational Therapy Evaluation Patient Details Name: Jonathon Welch MRN: [id number] DOB: [birth year] Today's Date: 11/26/2021   History of Present Illness The patient is an 86 yo male that presented to ED from clinic due to dehydration and possible UTI. Work up showed sepsis, a UTI and CT abdomen and pelvis without contrast shows possible left perinephric fat stranding suggestive of pyelonephritis.  PMH of A-fib, HTN, Parkinson's, polycystic kidney disease, CKD stage IIIa, HDL, and aortic valve stenosis status post AVR as well as systolic heart failure.   Clinical Impression   Pt seen for OT evaluation this date in setting of acute hospitalization d/t UTI. Pt with several recent falls as well. He presents with generalized weakness and deconditioning. Pt lives in 2 story home with spouse with 2 STE and their main BR/BA on first floor. They are not able to drive and their son who lives in [place] is responsible for doing their errands. Pt with some decreased safety awareness as well and some decreased carryover when cued for safe use of 2WW. He has a walker at home, but son reports he only uses intermittently despite increasing fall frequency. Pt currently requires MIN to CGA for ADL transfers with RW and demos decreased standing balance (F initially, but fades to POOR balance with ~2-3 mins static standing). Pt requires MOD A for seated LB ADLs and his spouse was assisting some at baseline, but pt has been steadily requiring increased assistance per their son. Pt returned to recliner at end of session. Orthostatic VS taken as he was noted to get less talkative and close eyes in standing requiring assist to safely come to sitting. Recommending STR f/u therapy services.   Orthostatics as follows:  Supine BP of 138/68 (89) with HR of 57 bpm Sitting BP of 143/112 (123) with HR of 97 bpm Standing BP of 104/53 (70) with HR of 88 bpm      Recommendations for follow up therapy are one component of a  multi-disciplinary discharge planning process, led by the attending physician.  Recommendations may be updated based on patient status, additional functional criteria and insurance authorization.   Follow Up Recommendations  Skilled nursing-short term rehab (<3 hours/day)    Assistance Recommended at Discharge Intermittent Supervision/Assistance  Patient can return home with the following A little help with walking and/or transfers;A lot of help with bathing/dressing/bathroom;Assistance with cooking/housework;Direct supervision/assist for medications management;Direct supervision/assist for financial management;Assist for transportation;Help with stairs or ramp for entrance    Functional Status Assessment  Patient has had a recent decline in their functional status and demonstrates the ability to make significant improvements in function in a reasonable and predictable amount of time.  Equipment Recommendations  BSC/3in1;Tub/shower seat;None recommended by OT (2ww)    Recommendations for Other Services       Precautions / Restrictions Precautions Precautions: Fall Restrictions Weight Bearing Restrictions: No      Mobility Bed Mobility Overal bed mobility: Needs Assistance Bed Mobility: Supine to Sit     Supine to sit: Min assist, HOB elevated     General bed mobility comments: up with PT when OT enters room    Transfers Overall transfer level: Needs assistance Equipment used: Rolling walker (2 wheels) Transfers: Sit to/from Stand, Bed to chair/wheelchair/BSC Sit to Stand: Min guard, Min assist           General transfer comment: cues for safety each time with limited carryover of safe use of RW      Balance Overall balance assessment: Needs  assistance Sitting-balance support: Feet supported Sitting balance-Leahy Scale: Good   Postural control: Posterior lean Standing balance support: Reliant on assistive device for balance Standing balance-Leahy Scale:  Fair Standing balance comment: significant posterior lean requiring increased time to right for static standing balance each stand                           ADL either performed or assessed with clinical judgement   ADL                                         General ADL Comments: SETUP to MIN A for seated UB ADLs, MOD A for seated LB ADLs, MIN A to CGA for transfers with RW.     Vision Patient Visual Report: No change from baseline       Perception     Praxis      Pertinent Vitals/Pain Pain Assessment Pain Assessment: No/denies pain     Hand Dominance Right   Extremity/Trunk Assessment Upper Extremity Assessment Upper Extremity Assessment: Generalized weakness   Lower Extremity Assessment Lower Extremity Assessment: Generalized weakness       Communication Communication Communication: No difficulties;HOH   Cognition Arousal/Alertness: Awake/alert Behavior During Therapy: WFL for tasks assessed/performed Overall Cognitive Status: Difficult to assess                                 General Comments: somewhat delayed responses/increased processing time. Some general drowsiness, but mostly appropriate with commands.     General Comments       Exercises Other Exercises Other Exercises: OT ed re: safety considerations/fall prevention including being aware of orthostatic BP   Shoulder Instructions      Home Living Family/patient expects to be discharged to:: Private residence Living Arrangements: Spouse/significant other Available Help at Discharge: Available 24 hours/day Type of Home: House Home Access: Stairs to enter [employer] of Steps: 2, grab bar mounted on wall by the stairs on the R that he can reach   Home Layout: Able to live on main level with bedroom/bathroom;Two level     Bathroom Shower/Tub: Walk-in shower         Home Equipment: Grab bars - tub/shower;BSC/3in1;Rolling Environmental consultant (2  wheels);Rollator (4 wheels)   Additional Comments: pt reported many falls recently      Prior Functioning/Environment Prior Level of Function : Needs assist       Physical Assist : Mobility (physical);ADLs (physical) Mobility (physical): Bed mobility;Transfers ADLs (physical): IADLs Mobility Comments: has a rollator and a RW, but didn't normally use it; except for the last week or so ADLs Comments: Pt's son runs errands for both pt and his spouse including getting groceries-lives in [place]. In addition, pt requires LB ADL assist from spouse and has been steadily requiring increased assist        OT Problem List: Decreased strength;Decreased activity tolerance;Decreased safety awareness;Decreased knowledge of use of DME or AE      OT Treatment/Interventions: Self-care/ADL training;Therapeutic exercise;Therapeutic activities;DME and/or AE instruction    OT Goals(Current goals can be found in the care plan section) Acute Rehab OT Goals Patient Stated Goal: to get stronger, then go home OT Goal Formulation: With patient/family Time For Goal Achievement: 12/10/21 Potential to Achieve Goals: Good ADL Goals Pt Will Transfer to Toilet:  with min guard assist;with supervision Pt Will Perform Toileting - Clothing Manipulation and hygiene: with supervision  OT Frequency: Min 2X/week    Co-evaluation              AM-PAC OT "6 Clicks" Daily Activity     Outcome Measure Help from another person eating meals?: None Help from another person taking care of personal grooming?: A Little Help from another person toileting, which includes using toliet, bedpan, or urinal?: A Lot Help from another person bathing (including washing, rinsing, drying)?: A Lot Help from another person to put on and taking off regular upper body clothing?: A Little Help from another person to put on and taking off regular lower body clothing?: A Lot 6 Click Score: 16   End of Session Equipment Utilized During  Treatment: Gait belt;Rolling walker (2 wheels) Nurse Communication: Mobility status;Other (comment) (BP)  Activity Tolerance: Patient tolerated treatment well Patient left: in chair;with call bell/phone within reach;with chair alarm set;with family/visitor present  OT Visit Diagnosis: Unsteadiness on feet (R26.81);Muscle weakness (generalized) (M62.81)                Time: 0814-4818 OT Time Calculation (min): 38 min Charges:  OT General Charges $OT Visit: 1 Visit OT Evaluation $OT Eval Moderate Complexity: 1 Mod OT Treatments $Self Care/Home Management : 8-22 mins $Therapeutic Activity: 8-22 mins  Gerrianne Scale, MS, OTR/L ascom [phone] 11/26/21, 6:20 PM

## 2021-11-26 NOTE — Assessment & Plan Note (Signed)
Blood pressure labile - Continue amlodipine

## 2021-11-26 NOTE — Consult Note (Signed)
[facility]  CONSULT NOTE    Date: 11/26/2021                  Patient Name:  Jonathon Welch  MRN: [id number]  DOB: 03-11-1936  Age / Sex: 86 y.o., male         PCP: Rusty Aus, MD                 Service Requesting Consult: Dr. Loleta Books                 Reason for Consult: Acute kidney injury            History of Present Illness: Mr. ANUJ SUMMONS presents to [facility] for weakness and confusion. Patient's history taken mostly from wife and chart as patient is hard of hearing and has baseline dementia. Patient has been having diarrhea for last few days, not eating or drinking and continued to take his furosemide. Patient went to see his PCP on 2/6 where he was found to be 7 pounds below his target weight. Family held his furosemide and started Pedialyte. Urine studies from that day is growing citrobacter.   Patient then went back to PCP yesterday where he was given IV normal saline 583mL bolus. Patient found to have hypotension and was then sent to the ED for further evaluation. Patient diagnosed with pyelonephritis on CT and admitted to [facility].   Nephrology consulted for acute kidney injury. Patient does not follow with nephrology as an outpatient.   Patient states he is feeling better today from yesterday. Wife at bedside who assists with history taking. Patient is hard of hearing and has some baseline dementia. Denies any more diarrhea and tolerating food currently.    Medications: Outpatient medications: Medications Prior to Admission  Medication Sig Dispense Refill Last Dose   acetaminophen (TYLENOL) 500 MG tablet Take 500 mg by mouth daily as needed for moderate pain or headache.   Past Week   amLODipine (NORVASC) 2.5 MG tablet Take by mouth.   11/25/2021   apixaban (ELIQUIS) 2.5 MG TABS tablet Take 2.5 mg by mouth 2 (two) times daily.   11/25/2021   carbidopa-levodopa (SINEMET IR) 25-100 MG tablet Take 2 tablets by mouth 4 (four) times daily.   11/25/2021  at 1200   CRANBERRY PO Take by mouth.   11/25/2021   ferrous gluconate (FERGON) 324 MG tablet Take 324 mg by mouth daily with breakfast.   11/25/2021   finasteride (PROSCAR) 5 MG tablet Take 1 tablet (5 mg total) by mouth daily. 90 tablet 3 11/25/2021   omeprazole (PRILOSEC) 40 MG capsule Take 1 capsule by mouth daily.   11/25/2021   selegiline (ELDEPRYL) 5 MG capsule Take 5 mg by mouth 2 (two) times daily.    11/25/2021   carbidopa-levodopa (SINEMET CR) 50-200 MG tablet Take 1 tablet by mouth at bedtime. (Patient not taking: Reported on 11/25/2021)   Not Taking   furosemide (LASIX) 20 MG tablet Take 40 mg by mouth daily. (Patient not taking: Reported on 11/25/2021)   Not Taking   metolazone (ZAROXOLYN) 2.5 MG tablet Take 2.5 mg by mouth as needed. (Patient not taking: Reported on 11/25/2021)   Not Taking   mirtazapine (REMERON) 7.5 MG tablet Take 7.5 mg by mouth at bedtime. (Patient not taking: Reported on 11/25/2021)   Not Taking   Misc Natural Products ([employer] PROSTATE MAX + PO) Take by mouth. beta-sitos/D3/minerals/cranber (PROSTATE MAX PLUS ORAL)  modafinil (PROVIGIL) 100 MG tablet Take by mouth. (Patient not taking: Reported on 10/21/2021)       Current medications: Current Facility-Administered Medications  Medication Dose Route Frequency Provider Last Rate Last Admin   0.9 %  sodium chloride infusion   Intravenous Continuous Darliss Cheney, MD 50 mL/hr at 11/25/21 1758 New Bag at 11/25/21 1758   acetaminophen (TYLENOL) tablet 500 mg  500 mg Oral Daily PRN Darliss Cheney, MD       amLODipine (NORVASC) tablet 2.5 mg  2.5 mg Oral Daily Darliss Cheney, MD       apixaban Arne Cleveland) tablet 2.5 mg  2.5 mg Oral BID Darliss Cheney, MD   2.5 mg at 11/25/21 2131   carbidopa-levodopa (SINEMET IR) 25-100 MG per tablet immediate release 2 tablet  2 tablet Oral QID Darliss Cheney, MD   2 tablet at 11/26/21 0731   cefTRIAXone (ROCEPHIN) 2 g in sodium chloride 0.9 % 100 mL IVPB  2 g Intravenous Q24H Darliss Cheney, MD        ferrous gluconate (FERGON) tablet 324 mg  324 mg Oral Q breakfast Darliss Cheney, MD   324 mg at 11/26/21 0731   finasteride (PROSCAR) tablet 5 mg  5 mg Oral Daily Darliss Cheney, MD       ondansetron (ZOFRAN) tablet 4 mg  4 mg Oral Q6H PRN Darliss Cheney, MD       Or   ondansetron (ZOFRAN) injection 4 mg  4 mg Intravenous Q6H PRN Darliss Cheney, MD       pantoprazole (PROTONIX) EC tablet 40 mg  40 mg Oral Daily Darliss Cheney, MD       selegiline (ELDEPRYL) tablet 5 mg  5 mg Oral BID Darliss Cheney, MD   5 mg at 11/25/21 2131      Allergies: No Active Allergies    Past Medical History: Past Medical History:  Diagnosis Date   Anemia    vitamin b12 deficiency   Aortic valve disease    Atrial fibrillation ([place])    Cancer ([place])    basal and squamous cell skin cancer   DDD (degenerative disc disease), lumbar 07/25/2016   Depression    Dysrhythmia    atrial fibrillation   Herniation through surgical site    Hyperlipidemia    Hypertension    Left inguinal hernia 2019   Migraine headache    less frequent lately   Parkinson disease ([place])    Polycystic kidney disease    Right inguinal hernia    Tremor    Ventral hernia 2019     Past Surgical History: Past Surgical History:  Procedure Laterality Date   AORTIC AND MITRAL VALVE REPLACEMENT N/A    APPENDECTOMY     ASCENDING AORTIC ANEURYSM REPAIR W/ MECHANICAL AORTIC VALVE REPLACEMENT  1985   aneurysm repaired when replaced "blown out" tissue valve   CARDIAC VALVE REPLACEMENT  1980 and 1985   aortic valve replaced, mechanical valve   CORONARY ARTERY BYPASS GRAFT  1980 and 1985   aortic valve replaced not CABG   IR CATHETER TUBE CHANGE  05/14/2021   MOHS SURGERY     nose and ear   ROBOTIC ASSISTED LAPAROSCOPIC VENTRAL/INCISIONAL HERNIA REPAIR Right 04/06/2018   Procedure: ROBOTIC ASSISTED LAPAROSCOPIC VENTRAL/INCISIONAL HERNIA REPAIR AND RIGHT INGUINAL HERNIA;  Surgeon: Jules Husbands, MD;  Location: [facility] ORS;  Service: General;   Laterality: Right;     Family History: Family History  Problem Relation Age of Onset   Heart disease Mother  Hypertension Mother    Stroke Father    Kidney disease Sister      Social History: Social History   Socioeconomic History   Marital status: Married    Spouse name: Not on file   Number of children: Not on file   Years of education: Not on file   Highest education level: Not on file  Occupational History   Not on file  Tobacco Use   Smoking status: Former    Packs/day: 2.00    Years: 11.00    Pack years: 22.00    Types: Cigarettes    Quit date: 1971    Years since quitting: 52.1   Smokeless tobacco: Never  Vaping Use   Vaping Use: Never used  Substance and Sexual Activity   Alcohol use: No    Comment: occasional beer   Drug use: No   Sexual activity: Not Currently  Other Topics Concern   Not on file  Social History Narrative   Not on file   Social Determinants of Health   Financial Resource Strain: Not on file  Food Insecurity: Not on file  Transportation Needs: Not on file  Physical Activity: Not on file  Stress: Not on file  Social Connections: Not on file  Intimate Partner Violence: Not on file     Review of Systems: Review of Systems  Unable to perform ROS: Dementia   Vital Signs: Blood pressure 129/72, pulse 80, temperature 97.7 F (36.5 C), resp. rate 15, height 5\' 8"  (1.727 m), weight 68.5 kg, SpO2 96 %.  Weight trends: Filed Weights   11/25/21 0900  Weight: 68.5 kg    Physical Exam: General: NAD, laying in bed  Head: +hard of hearing, Moist oral mucosal membranes  Eyes: Anicteric, PERRL  Neck: Supple, trachea midline  Lungs:  Clear to auscultation  Heart: Regular rate and rhythm  Abdomen:  Soft, nontender, +ventral hernia  Extremities:  no peripheral edema.  Neurologic: Nonfocal, moving all four extremities, no tremors on examination  Skin: No lesions  Access: none     Lab results: Basic Metabolic Panel: Recent  Labs  Lab 11/25/21 0946 11/26/21 0447  NA 129* 132*  K 4.3 3.9  CL 96* 100  CO2 22 22  GLUCOSE 161* 147*  BUN 86* 91*  CREATININE 3.16* 2.99*  CALCIUM 8.4* 8.3*  MG 2.3  --     Liver Function Tests: Recent Labs  Lab 11/25/21 0946 11/26/21 0447  AST 13* 10*  ALT <5 <5  ALKPHOS 145* 170*  BILITOT 1.3* 1.0  PROT 6.3* 5.8*  ALBUMIN 2.5* 2.3*   Recent Labs  Lab 11/25/21 0946  LIPASE 24   No results for input(s): AMMONIA in the last 168 hours.  CBC: Recent Labs  Lab 11/25/21 0946 11/26/21 0447  WBC 24.0* 20.2*  NEUTROABS 21.8*  --   HGB 12.1* 12.2*  HCT 36.6* 36.8*  MCV 89.3 89.1  PLT 203 240    Cardiac Enzymes: No results for input(s): CKTOTAL, CKMB, CKMBINDEX, TROPONINI in the last 168 hours.  BNP: Invalid input(s): POCBNP  CBG: Recent Labs  Lab 11/25/21 1948 11/25/21 2329  GLUCAP 169* 150*    Microbiology: Results for orders placed or performed during the hospital encounter of 11/25/21  Resp Panel by RT-PCR (Flu A&B, Covid) Nasopharyngeal Swab     Status: None   Collection Time: 11/25/21  9:46 AM   Specimen: Nasopharyngeal Swab; Nasopharyngeal(NP) swabs in vial transport medium  Result Value Ref Range Status   SARS  Coronavirus 2 by RT PCR NEGATIVE NEGATIVE Final    Comment: (NOTE) SARS-CoV-2 target nucleic acids are NOT DETECTED.  The SARS-CoV-2 RNA is generally detectable in upper respiratory specimens during the acute phase of infection. The lowest concentration of SARS-CoV-2 viral copies this assay can detect is 138 copies/mL. A negative result does not preclude SARS-Cov-2 infection and should not be used as the sole basis for treatment or other patient management decisions. A negative result may occur with  improper specimen collection/handling, submission of specimen other than nasopharyngeal swab, presence of viral mutation(s) within the areas targeted by this assay, and inadequate number of viral copies(<138 copies/mL). A negative  result must be combined with clinical observations, patient history, and epidemiological information. The expected result is Negative.  Fact Sheet for Patients:  EntrepreneurPulse.com.au  Fact Sheet for Healthcare Providers:  IncredibleEmployment.be  This test is no t yet approved or cleared by the [place] FDA and  has been authorized for detection and/or diagnosis of SARS-CoV-2 by FDA under an Emergency Use Authorization (EUA). This EUA will remain  in effect (meaning this test can be used) for the duration of the COVID-19 declaration under Section 564(b)(1) of the Act, 21 U.S.C.section 360bbb-3(b)(1), unless the authorization is terminated  or revoked sooner.       Influenza A by PCR NEGATIVE NEGATIVE Final   Influenza B by PCR NEGATIVE NEGATIVE Final    Comment: (NOTE) The Xpert Xpress SARS-CoV-2/FLU/RSV plus assay is intended as an aid in the diagnosis of influenza from Nasopharyngeal swab specimens and should not be used as a sole basis for treatment. Nasal washings and aspirates are unacceptable for Xpert Xpress SARS-CoV-2/FLU/RSV testing.  Fact Sheet for Patients: EntrepreneurPulse.com.au  Fact Sheet for Healthcare Providers: IncredibleEmployment.be  This test is not yet approved or cleared by the [place] FDA and has been authorized for detection and/or diagnosis of SARS-CoV-2 by FDA under an Emergency Use Authorization (EUA). This EUA will remain in effect (meaning this test can be used) for the duration of the COVID-19 declaration under Section 564(b)(1) of the Act, 21 U.S.C. section 360bbb-3(b)(1), unless the authorization is terminated or revoked.  Performed at [facility], [address] [place], [place] [address]   Blood culture (routine x 2)     Status: None (Preliminary result)   Collection Time: 11/25/21 11:43 AM   Specimen: BLOOD  Result Value Ref Range  Status   Specimen Description BLOOD BLOOD RIGHT FOREARM  Final   Special Requests   Final    BOTTLES DRAWN AEROBIC AND ANAEROBIC Blood Culture adequate volume   Culture   Final    NO GROWTH < 24 HOURS Performed at [facility], [address], [place], [place] [address]    Report Status PENDING  Incomplete  Blood culture (routine x 2)     Status: None (Preliminary result)   Collection Time: 11/25/21 11:43 AM   Specimen: BLOOD  Result Value Ref Range Status   Specimen Description BLOOD BLOOD LEFT HAND  Final   Special Requests   Final    BOTTLES DRAWN AEROBIC AND ANAEROBIC Blood Culture adequate volume   Culture   Final    NO GROWTH < 24 HOURS Performed at [facility], [address], [place], [place] [address]    Report Status PENDING  Incomplete    Coagulation Studies: Recent Labs    11/26/21 0447  LABPROT 24.4*  INR 2.2*    Urinalysis: Recent Labs    11/25/21 1227  COLORURINE YELLOW*  LABSPEC 1.014  PHURINE 5.0  GLUCOSEU NEGATIVE  HGBUR SMALL*  BILIRUBINUR NEGATIVE  KETONESUR NEGATIVE  PROTEINUR 100*  NITRITE NEGATIVE  LEUKOCYTESUR MODERATE*      Imaging: CT ABDOMEN PELVIS WO CONTRAST  Result Date: 11/25/2021 CLINICAL DATA:  Abdominal pain EXAM: CT ABDOMEN AND PELVIS WITHOUT CONTRAST TECHNIQUE: Multidetector CT imaging of the abdomen and pelvis was performed following the standard protocol without IV contrast. RADIATION DOSE REDUCTION: This exam was performed according to the departmental dose-optimization program which includes automated exposure control, adjustment of the mA and/or kV according to patient size and/or use of iterative reconstruction technique. COMPARISON:  CT abdomen and pelvis dated Mar 16, 2020; MRI abdomen dated April 09, 2021 FINDINGS: Lower chest: Cardiomegaly with coronary artery and mitral annular calcifications. Small bilateral pleural effusions and atelectasis. Hepatobiliary: Numerous low-attenuation lesions are seen  throughout the liver, but most pronounced in the left lobe of the liver, unchanged compared to prior and likely simple cysts. Cholelithiasis with no gallbladder wall thickening. Cystic lesion of the head of the pancreas measuring 2.0 cm Pancreas: Unchanged pancreatic head cyst measuring 2.1 cm. No peripancreatic inflammation. Spleen: Normal in size without focal abnormality. Adrenals/Urinary Tract: Bilateral adrenal glands are unremarkable. New left perinephric fat stranding. A complex cyst of the lower pole of the left kidney is decreased in size when compared with prior exam, measuring up to 2.0 cm on series 5, image 32, previously measured up to 5.1 cm. Additional numerous cystic lesions are seen throughout the bilateral kidneys, most are compatible with simple cysts, others demonstrate high density, likely due to proteinaceous material. Bladder is unremarkable. Stomach/Bowel: Small hiatal hernia. Stomach is otherwise unremarkable. No bowel wall thickening, inflammatory change or evidence of obstruction. Appendix is not definitely visualized, although there are no secondary findings of acute appendicitis. Vascular/Lymphatic: Aortic atherosclerosis. No enlarged abdominal or pelvic lymph nodes. Reproductive: Prostate is unremarkable. Other: Small umbilical hernia containing fat and fluid. Trace pelvic free fluid. Musculoskeletal: No acute or significant osseous findings. IMPRESSION: 1. New left perinephric fat stranding. A complex cyst of the lower pole the left kidney is decreased in size when compared with prior exam. Findings may be due to ruptured cyst, although infection is an additional consideration. Recommend correlation with urinalysis. 2. Findings compatible with polycystic kidney disease including numerous hepatic and renal cysts. 3. Stable cystic lesion of the head of the pancreas, previously evaluated on prior MRI. 4.  Aortic Atherosclerosis (ICD10-I70.0). Electronically Signed   By: Yetta Glassman  M.D.   On: 11/25/2021 10:32   DG Chest Portable 1 View  Result Date: 11/25/2021 CLINICAL DATA:  Congestive heart failure. Hypertensive earlier today. History of CABG and aortic valve replacement. EXAM: PORTABLE CHEST 1 VIEW COMPARISON:  AP chest 03/01/2021 FINDINGS: Status post median sternotomy. Mildly to moderately enlarged cardiac silhouette, unchanged. Likely mitral annular calcification. Mild calcification within aortic arch. Mildly decreased lung volumes with bronchovascular crowding. Cephalization of the pulmonary vasculature without overt pulmonary edema. No pleural effusion or pneumothorax. No acute skeletal abnormality. IMPRESSION: Cephalization of the pulmonary vasculature without overt pulmonary edema. Electronically Signed   By: Yvonne Kendall M.D.   On: 11/25/2021 09:51   ECHOCARDIOGRAM COMPLETE  Result Date: 11/25/2021    ECHOCARDIOGRAM REPORT   Patient Name:   Jonathon Welch Date of Exam: 11/25/2021 Medical Rec #:  [id number]        Height:       68.0 in Accession #:    [id number]  Weight:       151.0 lb Date of Birth:  04-Apr-1936        BSA:          1.814 m Patient Age:    86 years         BP:           143/121 mmHg Patient Gender: M                HR:           72 bpm. Exam Location:  [facility] Procedure: 2D Echo, Color Doppler and Cardiac Doppler STAT ECHO Indications:     I50.21 congestive heart failure-Acute Systolic  History:         Patient has no prior history of Echocardiogram examinations.                  AVR, Arrythmias:Atrial Fibrillation, Signs/Symptoms:Shortness                  of Breath; Risk Factors:Hypertension and Dyslipidemia.  Sonographer:     Charmayne Sheer Referring Phys:  [id number] Ida Rogue SMITH Diagnosing Phys: Isaias Cowman MD  Sonographer Comments: Suboptimal subcostal window. IMPRESSIONS  1. Left ventricular ejection fraction, by estimation, is 30 to 35%. The left ventricle has moderately decreased function. The left ventricle has no regional wall motion  abnormalities. Left ventricular diastolic parameters were normal.  2. Right ventricular systolic function is normal. The right ventricular size is normal.  3. The mitral valve is normal in structure. Mild mitral valve regurgitation. No evidence of mitral stenosis.  4. The aortic valve is abnormal. There is mild thickening of the aortic valve. Aortic valve regurgitation is trivial. No aortic stenosis is present. Echo findings are consistent with normal structure and function of the aortic valve prosthesis.  5. The inferior vena cava is normal in size with greater than 50% respiratory variability, suggesting right atrial pressure of 3 mmHg. FINDINGS  Left Ventricle: Left ventricular ejection fraction, by estimation, is 30 to 35%. The left ventricle has moderately decreased function. The left ventricle has no regional wall motion abnormalities. The left ventricular internal cavity size was normal in size. There is no left ventricular hypertrophy. Left ventricular diastolic parameters were normal. Right Ventricle: The right ventricular size is normal. No increase in right ventricular wall thickness. Right ventricular systolic function is normal. Left Atrium: Left atrial size was normal in size. Right Atrium: Right atrial size was normal in size. Pericardium: There is no evidence of pericardial effusion. Mitral Valve: The mitral valve is normal in structure. There is moderate thickening of the mitral valve leaflet(s). Mild to moderate mitral annular calcification. Mild mitral valve regurgitation. No evidence of mitral valve stenosis. MV peak gradient, 4.2 mmHg. The mean mitral valve gradient is 2.0 mmHg. Tricuspid Valve: The tricuspid valve is normal in structure. Tricuspid valve regurgitation is mild . No evidence of tricuspid stenosis. Aortic Valve: The aortic valve is abnormal. There is mild thickening of the aortic valve. Aortic valve regurgitation is trivial. No aortic stenosis is present. Aortic valve mean gradient  measures 12.0 mmHg. Aortic valve peak gradient measures 23.2 mmHg. Aortic valve area, by VTI measures 2.51 cm. There is a bioprosthetic valve present in the aortic position. Echo findings are consistent with normal structure and function of the aortic valve prosthesis. Pulmonic Valve: The pulmonic valve was normal in structure. Pulmonic valve regurgitation is not visualized. No evidence of pulmonic stenosis. Aorta: The aortic root is normal in size  and structure. Venous: The inferior vena cava is normal in size with greater than 50% respiratory variability, suggesting right atrial pressure of 3 mmHg. IAS/Shunts: No atrial level shunt detected by color flow Doppler.  LEFT VENTRICLE PLAX 2D LVIDd:         4.89 cm   Diastology LVIDs:         4.23 cm   LV e' medial:    4.13 cm/s LV PW:         1.20 cm   LV E/e' medial:  29.1 LV IVS:        1.48 cm   LV e' lateral:   7.07 cm/s LVOT diam:     2.30 cm   LV E/e' lateral: 17.0 LV SV:         96 LV SV Index:   53 LVOT Area:     4.15 cm  RIGHT VENTRICLE RV Basal diam:  4.00 cm LEFT ATRIUM              Index        RIGHT ATRIUM           Index LA diam:        5.20 cm  2.87 cm/m   RA Area:     16.00 cm LA Vol (A2C):   119.0 ml 65.61 ml/m  RA Volume:   41.40 ml  22.83 ml/m LA Vol (A4C):   79.4 ml  43.78 ml/m LA Biplane Vol: 99.1 ml  54.64 ml/m  AORTIC VALVE                     PULMONIC VALVE AV Area (Vmax):    2.41 cm      PV Vmax:       1.23 m/s AV Area (Vmean):   2.29 cm      PV Vmean:      72.200 cm/s AV Area (VTI):     2.51 cm      PV VTI:        0.215 m AV Vmax:           241.00 cm/s   PV Peak grad:  6.1 mmHg AV Vmean:          162.000 cm/s  PV Mean grad:  3.0 mmHg AV VTI:            0.384 m AV Peak Grad:      23.2 mmHg AV Mean Grad:      12.0 mmHg LVOT Vmax:         140.00 cm/s LVOT Vmean:        89.100 cm/s LVOT VTI:          0.232 m LVOT/AV VTI ratio: 0.60  AORTA Ao Root diam: 3.90 cm MITRAL VALVE MV Area (PHT): 4.46 cm     SHUNTS MV Area VTI:   3.09 cm      Systemic VTI:  0.23 m MV Peak grad:  4.2 mmHg     Systemic Diam: 2.30 cm MV Mean grad:  2.0 mmHg MV Vmax:       1.03 m/s MV Vmean:      71.3 cm/s MV Decel Time: 170 msec MV E velocity: 120.00 cm/s MV A velocity: 96.10 cm/s MV E/A ratio:  1.25 Isaias Cowman MD Electronically signed by Isaias Cowman MD Signature Date/Time: 11/25/2021/3:34:22 PM    Final      Assessment & Plan: Mr. MENASHE KAFER is a 86 y.o. white male with  polycystic kidney disease, systolic congestive heart failure EFof 30-35%, atrial fibrillation, aortic valve disease with aortic valve replacement, hypertension, Parkinson's, Dementia, migraines, , who was admitted to [facility] on 11/25/2021 for Pyelonephritis [N12] Troponin I above reference range [R77.8] AKI (acute kidney injury) ([place]) [N17.9] Sepsis ([place]) [A41.9]  Acute kidney injury on chronic kidney disease stage IIIB: baseline creatinine of 1.7, GFR of 40 04/01/21. Acute kidney injury secondary to pyelonephritis and tubulitis. Chronic kidney disease secondary to Polycystic kidney disease, hypertension, vascular disease and acute kidney injuries with limited recovery. No IV contrast exposure.  - holding furosemide - Continue IV fluids: NS at 58mL/hr. Monitor volume status closely due to underlying systolic congestive heart failure.   Pyelonephritis with sepsis: on ceftriaxone. Urine culture from 2/6 shows citrobacter koseri with pansensitivity. No stones identified on imaging.  - Discussed urinary prophylaxis with patient's wife.   Hyponatremia: secondary to acute kidney injury. Improving with improved PO intake and IV fluids.   Hypertension: current regimen of amlodipine and sinemet.       LOS: 1 Airyana Sprunger 2/10/20239:16 AM

## 2021-11-26 NOTE — TOC Initial Note (Signed)
Transition of Care [facility]) - Initial/Assessment Note    Patient Details  Name: Jonathon Welch MRN: [id number] Date of Birth: 05-Feb-1936  Transition of Care [facility]) CM/SW Contact:    Alberteen Sam, LCSW Phone Number: 11/26/2021, 2:56 PM  Clinical Narrative:                  CSW spoke with patient's spouse Jonathon Welch regarding SNF rec, reports agreeable to [facility]. CSW has sent referral.   She reports she will be at hospital tomorrow with her son,wishes to follow up on other facility choices with CSW then. CSW to meet with them tomorrow.    Expected Discharge Plan: Skilled Nursing Facility Barriers to Discharge: Continued Medical Work up   Patient Goals and CMS Choice Patient states their goals for this hospitalization and ongoing recovery are:: to go home CMS Medicare.gov Compare Post Acute Care list provided to:: Patient Represenative (must comment) (spouse) Choice offered to / list presented to : Spouse  Expected Discharge Plan and Services Expected Discharge Plan: [place]                                              Prior Living Arrangements/Services   Lives with:: Self                   Activities of Daily Living Home Assistive Devices/Equipment: Environmental consultant (specify type), Raised toilet seat with rails ADL Screening (condition at time of admission) Patient's cognitive ability adequate to safely complete daily activities?: No Is the patient deaf or have difficulty hearing?: Yes Does the patient have difficulty seeing, even when wearing glasses/contacts?: No Does the patient have difficulty concentrating, remembering, or making decisions?: Yes Patient able to express need for assistance with ADLs?: Yes Does the patient have difficulty dressing or bathing?: Yes Independently performs ADLs?: No Communication: Independent Dressing (OT): Needs assistance Is this a change from baseline?: Change from baseline, expected to last >3 days Grooming:  Needs assistance Is this a change from baseline?: Change from baseline, expected to last >3 days Feeding: Independent Bathing: Needs assistance Is this a change from baseline?: Change from baseline, expected to last >3 days Toileting: Needs assistance Is this a change from baseline?: Change from baseline, expected to last >3days In/Out Bed: Needs assistance Is this a change from baseline?: Change from baseline, expected to last >3 days Walks in Home: Needs assistance Is this a change from baseline?: Change from baseline, expected to last >3 days Does the patient have difficulty walking or climbing stairs?: Yes Weakness of Legs: Both Weakness of Arms/Hands: Both  Permission Sought/Granted                  Emotional Assessment         Alcohol / Substance Use: Not Applicable Psych Involvement: No (comment)  Admission diagnosis:  Pyelonephritis [N12] Troponin I above reference range [R77.8] AKI (acute kidney injury) ([place]) [N17.9] Sepsis ([place]) [A41.9] Patient Active Problem List   Diagnosis Date Noted   Acquired thrombophilia ([place]) 11/26/2021   Acute renal failure superimposed on stage 3b chronic kidney disease ([place]) 11/26/2021   Stage 3b chronic kidney disease (CKD) ([place]) 11/26/2021   Acute on chronic systolic CHF (congestive heart failure) ([place]) 11/26/2021   Polycystic kidney disease 11/26/2021   Sepsis ([place]) 11/25/2021   Acute pyelonephritis 11/25/2021   Renal mass 03/21/2021  Complicated UTI (urinary tract infection) 03/02/2021   Chronic anticoagulation 03/02/2021   Non-recurrent inguinal hernia without obstruction or gangrene    Ventral hernia without obstruction or gangrene    B12 deficiency 07/25/2016   DDD (degenerative disc disease), lumbar 07/25/2016   Chronic atrial fibrillation ([place]) 01/14/2016   Combined fat and carbohydrate induced hyperlipemia 01/14/2015   H/O aortic valve replacement 04/10/2014   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

## 2021-11-26 NOTE — Assessment & Plan Note (Signed)
-   Continue finasteride 

## 2021-11-26 NOTE — Assessment & Plan Note (Addendum)
Due to polycystic kidney disease, now with AKI

## 2021-11-26 NOTE — Progress Notes (Signed)
PT Cancellation Note  Patient Details Name: Jonathon Welch MRN: [id number] DOB: 1935/12/18   Cancelled Treatment:    Reason Eval/Treat Not Completed: Other (comment). Pt sleeping, family requesting a bit more time to allow the patient to sleep longer, PT to re-attempt as able.   Lieutenant Diego PT, DPT 9:57 AM,11/26/21

## 2021-11-26 NOTE — NC FL2 (Signed)
[place] LEVEL OF CARE SCREENING TOOL     IDENTIFICATION  Patient Name: Jonathon Welch Birthdate: 1935/11/21 Sex: male Admission Date (Current Location): 11/25/2021  [facility] and [place] Number:  Engineering geologist and Address:  [facility], [address] [place], [place] [address]      Provider Number: [id number]  Attending Physician Name and Address:  Edwin Dada, *  Relative Name and Phone Number:  Lorna Dibble (spouse) [phone]    Current Level of Care: Hospital Recommended Level of Care: [place] Prior Approval Number:    Date Approved/Denied:   PASRR Number: [id number]  Discharge Plan: SNF    Current Diagnoses: Patient Active Problem List   Diagnosis Date Noted   Acquired thrombophilia ([place]) 11/26/2021   Acute renal failure superimposed on stage 3b chronic kidney disease ([place]) 11/26/2021   Stage 3b chronic kidney disease (CKD) ([place]) 11/26/2021   Acute on chronic systolic CHF (congestive heart failure) (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   Aortic valve defect 03/21/2012   Hypertension 03/21/2012   Benign essential HTN 04/14/2008   BPH (benign prostatic hyperplasia) 04/13/2008   Parkinson's disease ([place]) 04/13/2008    Orientation RESPIRATION BLADDER Height & Weight     Self, Time, Place  Normal External catheter,  Incontinent Weight: 151 lb 0.2 oz (68.5 kg) Height:  5\' 8"  (172.7 cm)  BEHAVIORAL SYMPTOMS/MOOD NEUROLOGICAL BOWEL NUTRITION STATUS      Continent Diet (see discharge summary)  AMBULATORY STATUS COMMUNICATION OF NEEDS Skin   Limited Assist Verbally Normal                       Personal Care Assistance Level of Assistance  Bathing, Feeding, Dressing, Total care Bathing Assistance: Limited assistance Feeding assistance: Independent Dressing Assistance: Limited assistance Total Care Assistance: Limited assistance   Functional Limitations Info  Sight, Hearing, Speech Sight Info: Impaired Hearing Info: Impaired Speech Info: Adequate    SPECIAL CARE FACTORS FREQUENCY  PT (By licensed PT), OT (By licensed OT)     PT Frequency: min 4x weekly OT Frequency: min 4x weekly            Contractures Contractures Info: Not present    Additional Factors Info  Code Status, Allergies Code Status Info: DNR Allergies Info: No Active Allergies           Current Medications (11/26/2021):  This is the current hospital active medication list Current Facility-Administered Medications  Medication Dose Route Frequency Provider Last Rate Last Admin   0.9 %  sodium chloride infusion   Intravenous Continuous Darliss Cheney, MD 50 mL/hr at 11/26/21 1503 New Bag at 11/26/21 1503   acetaminophen (TYLENOL) tablet 500 mg  500 mg Oral Daily PRN Darliss Cheney, MD       amLODipine (NORVASC) tablet 2.5 mg  2.5 mg Oral BID Danford, Suann Larry, MD       apixaban (ELIQUIS) tablet 2.5 mg  2.5  mg Oral BID Darliss Cheney, MD   2.5 mg at 11/26/21 1155   carbidopa-levodopa (SINEMET IR) 25-100 MG per tablet immediate release 2 tablet  2 tablet Oral QID Darliss Cheney, MD   2 tablet at 11/26/21 1155   cefTRIAXone (ROCEPHIN) 2 g in sodium chloride 0.9 % 100 mL IVPB  2 g Intravenous Q24H Darliss Cheney, MD   Stopped at 11/26/21 1451   ferrous gluconate (FERGON) tablet 324 mg  324 mg Oral Q breakfast Darliss Cheney, MD   324 mg at 11/26/21 0731   finasteride (PROSCAR) tablet 5 mg  5 mg Oral Daily Darliss Cheney, MD   5 mg at 11/26/21 1155   ondansetron (ZOFRAN) tablet 4 mg  4 mg Oral Q6H PRN Darliss Cheney, MD       Or   ondansetron (ZOFRAN) injection 4 mg  4 mg Intravenous Q6H PRN Pahwani, Einar Grad, MD       pantoprazole (PROTONIX) EC tablet 40 mg  40 mg Oral Daily Pahwani, Einar Grad, MD   40 mg at 11/26/21 1155   selegiline (ELDEPRYL) tablet 5 mg  5 mg Oral BID Darliss Cheney, MD   5 mg at 11/26/21 1155     Discharge Medications: Please see discharge summary for a list of discharge medications.  Relevant Imaging Results:  Relevant Lab Results:   Additional Information SSN: [id number]  Alberteen Sam, LCSW

## 2021-11-26 NOTE — Assessment & Plan Note (Addendum)
Presented with tachypnea, leukocytosis, renal failure, elevated troponin.  CT showing left-sided perinephric stranding, recent urine culture growing Citrobacter.    Urine culture here growing Citrobacter, pansensitive Sepsis physiology resolving, but Cr still 2.44, WBC still 16K - Continue Rocephin, day 3

## 2021-11-26 NOTE — Assessment & Plan Note (Signed)
Heart rate controlled - Continue apixaban

## 2021-11-26 NOTE — Assessment & Plan Note (Signed)
See above

## 2021-11-26 NOTE — Assessment & Plan Note (Addendum)
Blood pressure normal - Continue amlodipine

## 2021-11-26 NOTE — Assessment & Plan Note (Signed)
-   Continue Sinemet, selegiline

## 2021-11-26 NOTE — Assessment & Plan Note (Signed)
Continue apixaban 

## 2021-11-26 NOTE — Assessment & Plan Note (Addendum)
-   Consult nephrology - Hold home Lasix, metolazone

## 2021-11-27 DIAGNOSIS — N1832 Chronic kidney disease, stage 3b: Secondary | ICD-10-CM

## 2021-11-27 DIAGNOSIS — N400 Enlarged prostate without lower urinary tract symptoms: Secondary | ICD-10-CM

## 2021-11-27 DIAGNOSIS — I482 Chronic atrial fibrillation, unspecified: Secondary | ICD-10-CM

## 2021-11-27 DIAGNOSIS — N179 Acute kidney failure, unspecified: Secondary | ICD-10-CM

## 2021-11-27 DIAGNOSIS — F0390 Unspecified dementia without behavioral disturbance: Secondary | ICD-10-CM

## 2021-11-27 DIAGNOSIS — D6869 Other thrombophilia: Secondary | ICD-10-CM

## 2021-11-27 DIAGNOSIS — I5023 Acute on chronic systolic (congestive) heart failure: Secondary | ICD-10-CM

## 2021-11-27 LAB — CBC
HCT: 37.2 % — ABNORMAL LOW (ref 39.0–52.0)
Hemoglobin: 12.2 g/dL — ABNORMAL LOW (ref 13.0–17.0)
MCH: 29.1 pg (ref 26.0–34.0)
MCHC: 32.8 g/dL (ref 30.0–36.0)
MCV: 88.8 fL (ref 80.0–100.0)
Platelets: 289 10*3/uL (ref 150–400)
RBC: 4.19 MIL/uL — ABNORMAL LOW (ref 4.22–5.81)
RDW: 14 % (ref 11.5–15.5)
WBC: 20.7 10*3/uL — ABNORMAL HIGH (ref 4.0–10.5)
nRBC: 0 % (ref 0.0–0.2)

## 2021-11-27 LAB — BASIC METABOLIC PANEL
Anion gap: 12 (ref 5–15)
BUN: 85 mg/dL — ABNORMAL HIGH (ref 8–23)
CO2: 19 mmol/L — ABNORMAL LOW (ref 22–32)
Calcium: 8.2 mg/dL — ABNORMAL LOW (ref 8.9–10.3)
Chloride: 103 mmol/L (ref 98–111)
Creatinine, Ser: 2.68 mg/dL — ABNORMAL HIGH (ref 0.61–1.24)
GFR, Estimated: 23 mL/min — ABNORMAL LOW (ref >60)
Glucose, Bld: 159 mg/dL — ABNORMAL HIGH (ref 70–99)
Potassium: 3.7 mmol/L (ref 3.5–5.1)
Sodium: 134 mmol/L — ABNORMAL LOW (ref 135–145)

## 2021-11-27 LAB — URINE CULTURE: Culture: >=100000 — AB

## 2021-11-27 MED ORDER — MIRTAZAPINE 15 MG PO TABS
7.5000 mg | ORAL_TABLET | Freq: Every day | ORAL | Status: DC
  Administered 2021-11-27 – 2021-11-28 (×2): 7.5 mg via ORAL
  Filled 2021-11-27 (×2): qty 1

## 2021-11-27 NOTE — Progress Notes (Signed)
Family declined SNF and home health services. Family plans on hiring their own aide prior to discharge. Patients son will let CSW know his plan tomorrow

## 2021-11-27 NOTE — Assessment & Plan Note (Addendum)
-   Continue mirtazapine 

## 2021-11-27 NOTE — Progress Notes (Signed)
[facility]  ROUNDING NOTE   Subjective:   Patient resting quietly with wife at bedside Poor appetite remains, wife reports he took a few bites of dinner and breakfast. His fluid restriction prevents him from drinking a lot Denies shortness of breath, nausea and vomiting  Creatinine improved 2.68 (2.99) Urine output recorded of 1.15L in 24 hours Sodium 134  Objective:  Vital signs in last 24 hours:  Temp:  [97.5 F (36.4 C)-98.1 F (36.7 C)] 97.8 F (36.6 C) (02/11 0738) Pulse Rate:  [54-107] 107 (02/11 0738) Resp:  [16-23] 18 (02/11 0738) BP: (117-137)/(74-114) 137/89 (02/11 0738) SpO2:  [93 %-97 %] 95 % (02/11 0738)  Weight change:  Filed Weights   11/25/21 0900  Weight: 68.5 kg    Intake/Output: I/O last 3 completed shifts: In: 1563.4 [P.O.:390; I.V.:1073.4; IV Piggyback:100] Out: 1700 [Urine:1700]   Intake/Output this shift:  Total I/O In: 120 [P.O.:120] Out: 0   Physical Exam: General: NAD  Head: Normocephalic, atraumatic. Moist oral mucosal membranes  Eyes: Anicteric  Lungs:  Clear to auscultation, normal effort  Heart: Regular rate and rhythm  Abdomen:  Soft, nontender  Extremities:  trace peripheral edema.  Neurologic: Nonfocal, moving all four extremities  Skin: No lesions       Basic Metabolic Panel: Recent Labs  Lab 11/25/21 0946 11/26/21 0447 11/27/21 0514  NA 129* 132* 134*  K 4.3 3.9 3.7  CL 96* 100 103  CO2 22 22 19*  GLUCOSE 161* 147* 159*  BUN 86* 91* 85*  CREATININE 3.16* 2.99* 2.68*  CALCIUM 8.4* 8.3* 8.2*  MG 2.3  --   --     Liver Function Tests: Recent Labs  Lab 11/25/21 0946 11/26/21 0447  AST 13* 10*  ALT <5 <5  ALKPHOS 145* 170*  BILITOT 1.3* 1.0  PROT 6.3* 5.8*  ALBUMIN 2.5* 2.3*   Recent Labs  Lab 11/25/21 0946  LIPASE 24   No results for input(s): AMMONIA in the last 168 hours.  CBC: Recent Labs  Lab 11/25/21 0946 11/26/21 0447 11/27/21 0514  WBC 24.0* 20.2* 20.7*  NEUTROABS 21.8*   --   --   HGB 12.1* 12.2* 12.2*  HCT 36.6* 36.8* 37.2*  MCV 89.3 89.1 88.8  PLT 203 240 289    Cardiac Enzymes: No results for input(s): CKTOTAL, CKMB, CKMBINDEX, TROPONINI in the last 168 hours.  BNP: Invalid input(s): POCBNP  CBG: Recent Labs  Lab 11/25/21 1948 11/25/21 2329  GLUCAP 169* 150*    Microbiology: Results for orders placed or performed during the hospital encounter of 11/25/21  Resp Panel by RT-PCR (Flu A&B, Covid) Nasopharyngeal Swab     Status: None   Collection Time: 11/25/21  9:46 AM   Specimen: Nasopharyngeal Swab; Nasopharyngeal(NP) swabs in vial transport medium  Result Value Ref Range Status   SARS Coronavirus 2 by RT PCR NEGATIVE NEGATIVE Final    Comment: (NOTE) SARS-CoV-2 target nucleic acids are NOT DETECTED.  The SARS-CoV-2 RNA is generally detectable in upper respiratory specimens during the acute phase of infection. The lowest concentration of SARS-CoV-2 viral copies this assay can detect is 138 copies/mL. A negative result does not preclude SARS-Cov-2 infection and should not be used as the sole basis for treatment or other patient management decisions. A negative result may occur with  improper specimen collection/handling, submission of specimen other than nasopharyngeal swab, presence of viral mutation(s) within the areas targeted by this assay, and inadequate number of viral copies(<138 copies/mL). A negative result must  be combined with clinical observations, patient history, and epidemiological information. The expected result is Negative.  Fact Sheet for Patients:  EntrepreneurPulse.com.au  Fact Sheet for Healthcare Providers:  IncredibleEmployment.be  This test is no t yet approved or cleared by the [place] FDA and  has been authorized for detection and/or diagnosis of SARS-CoV-2 by FDA under an Emergency Use Authorization (EUA). This EUA will remain  in effect (meaning this test can  be used) for the duration of the COVID-19 declaration under Section 564(b)(1) of the Act, 21 U.S.C.section 360bbb-3(b)(1), unless the authorization is terminated  or revoked sooner.       Influenza A by PCR NEGATIVE NEGATIVE Final   Influenza B by PCR NEGATIVE NEGATIVE Final    Comment: (NOTE) The Xpert Xpress SARS-CoV-2/FLU/RSV plus assay is intended as an aid in the diagnosis of influenza from Nasopharyngeal swab specimens and should not be used as a sole basis for treatment. Nasal washings and aspirates are unacceptable for Xpert Xpress SARS-CoV-2/FLU/RSV testing.  Fact Sheet for Patients: EntrepreneurPulse.com.au  Fact Sheet for Healthcare Providers: IncredibleEmployment.be  This test is not yet approved or cleared by the [place] FDA and has been authorized for detection and/or diagnosis of SARS-CoV-2 by FDA under an Emergency Use Authorization (EUA). This EUA will remain in effect (meaning this test can be used) for the duration of the COVID-19 declaration under Section 564(b)(1) of the Act, 21 U.S.C. section 360bbb-3(b)(1), unless the authorization is terminated or revoked.  Performed at [facility], [address] [place], [place] [address]   Blood culture (routine x 2)     Status: None (Preliminary result)   Collection Time: 11/25/21 11:43 AM   Specimen: BLOOD  Result Value Ref Range Status   Specimen Description BLOOD BLOOD RIGHT FOREARM  Final   Special Requests   Final    BOTTLES DRAWN AEROBIC AND ANAEROBIC Blood Culture adequate volume   Culture   Final    NO GROWTH 2 DAYS Performed at [facility], [address], [place], [place] [address]    Report Status PENDING  Incomplete  Blood culture (routine x 2)     Status: None (Preliminary result)   Collection Time: 11/25/21 11:43 AM   Specimen: BLOOD  Result Value Ref Range Status   Specimen Description BLOOD BLOOD LEFT HAND  Final   Special  Requests   Final    BOTTLES DRAWN AEROBIC AND ANAEROBIC Blood Culture adequate volume   Culture   Final    NO GROWTH 2 DAYS Performed at [facility], [address], [place], [place] [address]    Report Status PENDING  Incomplete  Urine Culture     Status: Abnormal   Collection Time: 11/25/21 12:27 PM   Specimen: Urine, Clean Catch  Result Value Ref Range Status   Specimen Description   Final    URINE, CLEAN CATCH Performed at [facility], [address], [place], [place] [address]    Special Requests   Final    NONE Performed at [facility], [address] [place], [place] [address]    Culture >=100,000 COLONIES/mL CITROBACTER KOSERI (A)  Final   Report Status 11/27/2021 FINAL  Final   Organism ID, Bacteria CITROBACTER KOSERI (A)  Final      Susceptibility   Citrobacter koseri - MIC*    CEFAZOLIN <=4 SENSITIVE Sensitive     CEFEPIME <=0.12 SENSITIVE Sensitive     CEFTRIAXONE <=0.25 SENSITIVE Sensitive     CIPROFLOXACIN <=  0.25 SENSITIVE Sensitive     GENTAMICIN <=1 SENSITIVE Sensitive     IMIPENEM <=0.25 SENSITIVE Sensitive     NITROFURANTOIN <=16 SENSITIVE Sensitive     TRIMETH/SULFA <=20 SENSITIVE Sensitive     PIP/TAZO <=4 SENSITIVE Sensitive     * >=100,000 COLONIES/mL CITROBACTER KOSERI    Coagulation Studies: Recent Labs    11/26/21 0447  LABPROT 24.4*  INR 2.2*    Urinalysis: Recent Labs    11/25/21 1227  COLORURINE YELLOW*  LABSPEC 1.014  PHURINE 5.0  GLUCOSEU NEGATIVE  HGBUR SMALL*  BILIRUBINUR NEGATIVE  KETONESUR NEGATIVE  PROTEINUR 100*  NITRITE NEGATIVE  LEUKOCYTESUR MODERATE*      Imaging: ECHOCARDIOGRAM COMPLETE  Result Date: 11/25/2021    ECHOCARDIOGRAM REPORT   Patient Name:   Jonathon Welch Date of Exam: 11/25/2021 Medical Rec #:  [id number]        Height:       68.0 in Accession #:    [id number]       Weight:       151.0 lb Date of Birth:  11-21-1935        BSA:          1.814 m Patient Age:    6 years          BP:           143/121 mmHg Patient Gender: M                HR:           72 bpm. Exam Location:  [facility] Procedure: 2D Echo, Color Doppler and Cardiac Doppler STAT ECHO Indications:     I50.21 congestive heart failure-Acute Systolic  History:         Patient has no prior history of Echocardiogram examinations.                  AVR, Arrythmias:Atrial Fibrillation, Signs/Symptoms:Shortness                  of Breath; Risk Factors:Hypertension and Dyslipidemia.  Sonographer:     Charmayne Sheer Referring Phys:  [id number] Ida Rogue SMITH Diagnosing Phys: Isaias Cowman MD  Sonographer Comments: Suboptimal subcostal window. IMPRESSIONS  1. Left ventricular ejection fraction, by estimation, is 30 to 35%. The left ventricle has moderately decreased function. The left ventricle has no regional wall motion abnormalities. Left ventricular diastolic parameters were normal.  2. Right ventricular systolic function is normal. The right ventricular size is normal.  3. The mitral valve is normal in structure. Mild mitral valve regurgitation. No evidence of mitral stenosis.  4. The aortic valve is abnormal. There is mild thickening of the aortic valve. Aortic valve regurgitation is trivial. No aortic stenosis is present. Echo findings are consistent with normal structure and function of the aortic valve prosthesis.  5. The inferior vena cava is normal in size with greater than 50% respiratory variability, suggesting right atrial pressure of 3 mmHg. FINDINGS  Left Ventricle: Left ventricular ejection fraction, by estimation, is 30 to 35%. The left ventricle has moderately decreased function. The left ventricle has no regional wall motion abnormalities. The left ventricular internal cavity size was normal in size. There is no left ventricular hypertrophy. Left ventricular diastolic parameters were normal. Right Ventricle: The right ventricular size is normal. No increase in right ventricular wall thickness. Right ventricular  systolic function is normal. Left Atrium: Left atrial size was normal in size. Right Atrium: Right atrial size was normal in size.  Pericardium: There is no evidence of pericardial effusion. Mitral Valve: The mitral valve is normal in structure. There is moderate thickening of the mitral valve leaflet(s). Mild to moderate mitral annular calcification. Mild mitral valve regurgitation. No evidence of mitral valve stenosis. MV peak gradient, 4.2 mmHg. The mean mitral valve gradient is 2.0 mmHg. Tricuspid Valve: The tricuspid valve is normal in structure. Tricuspid valve regurgitation is mild . No evidence of tricuspid stenosis. Aortic Valve: The aortic valve is abnormal. There is mild thickening of the aortic valve. Aortic valve regurgitation is trivial. No aortic stenosis is present. Aortic valve mean gradient measures 12.0 mmHg. Aortic valve peak gradient measures 23.2 mmHg. Aortic valve area, by VTI measures 2.51 cm. There is a bioprosthetic valve present in the aortic position. Echo findings are consistent with normal structure and function of the aortic valve prosthesis. Pulmonic Valve: The pulmonic valve was normal in structure. Pulmonic valve regurgitation is not visualized. No evidence of pulmonic stenosis. Aorta: The aortic root is normal in size and structure. Venous: The inferior vena cava is normal in size with greater than 50% respiratory variability, suggesting right atrial pressure of 3 mmHg. IAS/Shunts: No atrial level shunt detected by color flow Doppler.  LEFT VENTRICLE PLAX 2D LVIDd:         4.89 cm   Diastology LVIDs:         4.23 cm   LV e' medial:    4.13 cm/s LV PW:         1.20 cm   LV E/e' medial:  29.1 LV IVS:        1.48 cm   LV e' lateral:   7.07 cm/s LVOT diam:     2.30 cm   LV E/e' lateral: 17.0 LV SV:         96 LV SV Index:   53 LVOT Area:     4.15 cm  RIGHT VENTRICLE RV Basal diam:  4.00 cm LEFT ATRIUM              Index        RIGHT ATRIUM           Index LA diam:        5.20 cm  2.87  cm/m   RA Area:     16.00 cm LA Vol (A2C):   119.0 ml 65.61 ml/m  RA Volume:   41.40 ml  22.83 ml/m LA Vol (A4C):   79.4 ml  43.78 ml/m LA Biplane Vol: 99.1 ml  54.64 ml/m  AORTIC VALVE                     PULMONIC VALVE AV Area (Vmax):    2.41 cm      PV Vmax:       1.23 m/s AV Area (Vmean):   2.29 cm      PV Vmean:      72.200 cm/s AV Area (VTI):     2.51 cm      PV VTI:        0.215 m AV Vmax:           241.00 cm/s   PV Peak grad:  6.1 mmHg AV Vmean:          162.000 cm/s  PV Mean grad:  3.0 mmHg AV VTI:            0.384 m AV Peak Grad:      23.2 mmHg AV Mean Grad:  12.0 mmHg LVOT Vmax:         140.00 cm/s LVOT Vmean:        89.100 cm/s LVOT VTI:          0.232 m LVOT/AV VTI ratio: 0.60  AORTA Ao Root diam: 3.90 cm MITRAL VALVE MV Area (PHT): 4.46 cm     SHUNTS MV Area VTI:   3.09 cm     Systemic VTI:  0.23 m MV Peak grad:  4.2 mmHg     Systemic Diam: 2.30 cm MV Mean grad:  2.0 mmHg MV Vmax:       1.03 m/s MV Vmean:      71.3 cm/s MV Decel Time: 170 msec MV E velocity: 120.00 cm/s MV A velocity: 96.10 cm/s MV E/A ratio:  1.25 Isaias Cowman MD Electronically signed by Isaias Cowman MD Signature Date/Time: 11/25/2021/3:34:22 PM    Final      Medications:    cefTRIAXone (ROCEPHIN)  IV 2 g (11/27/21 1003)    amLODipine  2.5 mg Oral BID   apixaban  2.5 mg Oral BID   carbidopa-levodopa  2 tablet Oral QID   ferrous gluconate  324 mg Oral Q breakfast   finasteride  5 mg Oral Daily   pantoprazole  40 mg Oral Daily   selegiline  5 mg Oral BID   acetaminophen, ondansetron **OR** ondansetron (ZOFRAN) IV  Assessment/ Plan:  Jonathon Welch is a 86 y.o.  male with polycystic kidney disease, systolic congestive heart failure EFof 30-35%, atrial fibrillation, aortic valve disease with aortic valve replacement, hypertension, Parkinson's, Dementia, migraines, , who was admitted to [facility] on 11/25/2021 for Pyelonephritis [N12] Troponin I above reference range [R77.8] AKI (acute kidney  injury) ([place]) [N17.9] Sepsis ([place]) [A41.9]   Acute kidney injury on chronic kidney disease stage IIIB: baseline creatinine of 1.7, GFR of 40 04/01/21. Acute kidney injury secondary to pyelonephritis and tubulitis. Chronic kidney disease secondary to Polycystic kidney disease, hypertension, vascular disease and acute kidney injuries with limited recovery. No IV contrast exposure.  - holding furosemide - Creatinine improved with IV hydration. Will stop IVF today.  -Will remove fluid restriction and encourage oral intake.   Lab Results  Component Value Date   CREATININE 2.68 (H) 11/27/2021   CREATININE 2.99 (H) 11/26/2021   CREATININE 3.16 (H) 11/25/2021    Intake/Output Summary (Last 24 hours) at 11/27/2021 1057 Last data filed at 11/27/2021 0800 Gross per 24 hour  Intake 1081.7 ml  Output 1150 ml  Net -68.3 ml   Pyelonephritis with sepsis: on ceftriaxone. Urine culture from 2/6 shows citrobacter koseri with pansensitivity. No stones identified on imaging.  - Discussed urinary prophylaxis with patient's wife.  -Receiving Ceftriaxone    Hyponatremia: secondary to acute kidney injury. Improving with improved PO intake and IV fluids.   Sodium improving 134   Hypertension: current regimen of amlodipine and sinemet.        LOS: 2 [place] 2/11/202310:57 AM

## 2021-11-27 NOTE — Progress Notes (Addendum)
Progress Note   Patient: Jonathon Welch [id number]:[id number] DOB: 1935/10/22 DOA: 11/25/2021     2 DOS: the patient was seen and examined on 11/27/2021        Brief hospital course: Jonathon Welch is an 86 y.o. 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  Initially, developed SOB, peripheral edema, 10lb weight gain about 1 month ago.  Treated with oral Lasix, at PCP's office, edema seemed to resolve.  Then in last week, developed sluggishness, weakness.  Went to PCP's office, Cr up to 3.3, nursing unable to obtain BP, sent to the ER.  Noted to have recent culture of urine with Citrobacter.  In the ER, BP normal.  CT abdomen and pelvis consistent with UTI.  BNP >1000.  CXR with cephalization of vessels, no overt edema.         Assessment and Plan: * Sepsis ([place])- (present on admission) Presented with tachypnea, leukocytosis, renal failure, elevated troponin.  CT showing left-sided perinephric stranding, recent urine culture growing Citrobacter.    Urine culture here growing Citrobacter, pansensitive Sepsis physiology resolving - Continue Rocephin  Acute pyelonephritis- (present on admission) - See above  Acute renal failure superimposed on stage 3b chronic kidney disease ([place]) - Consult nephrology - Hold home Lasix  Dementia without behavioral disturbance (HCC) - Resume mirtazapine  Acute on chronic systolic CHF (congestive heart failure) (HCC) - Hold Lasix and metolazone  Stage 3b chronic kidney disease (CKD) (HCC) Due to polycystic kidney disease, now with AKI  Acquired thrombophilia (HCC) - Continue apixaban  Hypertension Blood pressure labile - Continue amlodipine   Parkinson's disease ([place])- (present on admission) - Continue Sinemet, selegiline  BPH (benign  prostatic hyperplasia)- (present on admission) - Continue finasteride  Benign essential HTN- (present on admission) Blood pressure normal - Continue amlodipine  Chronic atrial fibrillation ([place])- (present on admission) Heart rate controlled - Continue apixaban        Subjective: Attention improving, a little confusion after waking up from a nap, but this seems better to me now.  No chest pain, dyspnea, abdominal pain.  He has some dysuria.  Physical Exam: Vitals:   11/27/21 0528 11/27/21 0738 11/27/21 1204 11/27/21 1604  BP: 137/80 137/89 (!) 153/69 134/70  Pulse: 67 (!) 107 70 66  Resp: 18 18 18 18   Temp: 97.6 F (36.4 C) 97.8 F (36.6 C)  97.8 F (36.6 C)  TempSrc:      SpO2: 93% 95% 95% 97%  Weight:      Height:       Elderly adult male, lying in bed, makes eye contact, no obvious distress, watching television Heart rate regular, no murmurs, no lower extremity edema, no JVD Lung sounds diminished, but no rales or wheezes appreciated on my exam Abdominal exam normal, soft, no tenderness palpation or guarding, no CVA tenderness Attentive to conversation, but somewhat distracted at times, moves upper extremities with generalized weakness misdirected strength, some spontaneous movements of the tongue, no obvious tremor, bradykinesia is noted    Data Reviewed: My review of labs and imaging is notable for creatinine down to 2.7, slightly better White blood cell count 20,000, no change Discussed with nephrology    Family Communication: Wife and son at the bedside  Disposition: Status is: Inpatient Remains inpatient appropriate because:  Creatinine  still not close to baseline, we will stop IV fluids and monitor for an additional day.  At this point given his renal function, advanced age, comorbid Parkinson's disease I think the premature discharge twice renal function is still at this point and not clearly improving with oral hydration is unsafe           Planned Discharge Destination: Home     Author: Edwin Dada, MD 11/27/2021 4:07 PM  For on call review [email or web address].

## 2021-11-28 LAB — BASIC METABOLIC PANEL
Anion gap: 11 (ref 5–15)
BUN: 81 mg/dL — ABNORMAL HIGH (ref 8–23)
CO2: 20 mmol/L — ABNORMAL LOW (ref 22–32)
Calcium: 8.2 mg/dL — ABNORMAL LOW (ref 8.9–10.3)
Chloride: 102 mmol/L (ref 98–111)
Creatinine, Ser: 2.44 mg/dL — ABNORMAL HIGH (ref 0.61–1.24)
GFR, Estimated: 26 mL/min — ABNORMAL LOW (ref >60)
Glucose, Bld: 151 mg/dL — ABNORMAL HIGH (ref 70–99)
Potassium: 3.5 mmol/L (ref 3.5–5.1)
Sodium: 133 mmol/L — ABNORMAL LOW (ref 135–145)

## 2021-11-28 LAB — GLUCOSE, CAPILLARY: Glucose-Capillary: 173 mg/dL — ABNORMAL HIGH (ref 70–99)

## 2021-11-28 LAB — CBC
HCT: 38.2 % — ABNORMAL LOW (ref 39.0–52.0)
Hemoglobin: 12.3 g/dL — ABNORMAL LOW (ref 13.0–17.0)
MCH: 28.4 pg (ref 26.0–34.0)
MCHC: 32.2 g/dL (ref 30.0–36.0)
MCV: 88.2 fL (ref 80.0–100.0)
Platelets: 304 10*3/uL (ref 150–400)
RBC: 4.33 MIL/uL (ref 4.22–5.81)
RDW: 14.1 % (ref 11.5–15.5)
WBC: 15.9 10*3/uL — ABNORMAL HIGH (ref 4.0–10.5)
nRBC: 0 % (ref 0.0–0.2)

## 2021-11-28 MED ORDER — CARBIDOPA-LEVODOPA 25-100 MG PO TABS
1.0000 | ORAL_TABLET | Freq: Once | ORAL | Status: AC
  Administered 2021-11-28: 1 via ORAL
  Filled 2021-11-28: qty 1

## 2021-11-28 NOTE — Progress Notes (Signed)
Progress Note   Patient: Jonathon Welch [id number]:[id number] DOB: 08-04-1936 DOA: 11/25/2021     3 DOS: the patient was seen and examined on 11/28/2021        Brief hospital course: Mr. Jonathon Welch is an 86 y.o. 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  Initially, developed SOB, peripheral edema, 10lb weight gain about 1 month ago.  Treated with oral Lasix, at PCP's office, edema seemed to resolve.  Then in last week, developed sluggishness, weakness.  Went to PCP's office, Cr up to 3.3, nursing unable to obtain BP, sent to the ER.  Noted to have recent culture of urine with Citrobacter.  In the ER, BP normal.  CT abdomen and pelvis consistent with UTI.  BNP >1000.  CXR with cephalization of vessels, no overt edema.         Assessment and Plan: * Sepsis ([place])- (present on admission) Presented with tachypnea, leukocytosis, renal failure, elevated troponin.  CT showing left-sided perinephric stranding, recent urine culture growing Citrobacter.    Urine culture here growing Citrobacter, pansensitive Sepsis physiology resolving, but Cr still 2.44, WBC still 16K - Continue Rocephin, day 3  Acute pyelonephritis- (present on admission) - See above  Acute renal failure superimposed on stage 3b chronic kidney disease ([place]) - Consult nephrology - Hold home Lasix, metolazone  Dementia without behavioral disturbance ([place]) - Continue mirtazapine  Polycystic kidney disease - Follow up with Urologist  Acute on chronic systolic CHF (congestive heart failure) (HCC) No dyspnea or swelling or orthopnea - Hold Lasix and metolazone  Stage 3b chronic kidney disease (CKD) ([place]) Due to polycystic kidney disease, now with AKI  Acquired thrombophilia (HCC) - Continue  apixaban  Hypertension Blood pressure labile - Continue amlodipine   Parkinson's disease ([place])- (present on admission) - Continue Sinemet, selegiline  BPH (benign prostatic hyperplasia)- (present on admission) - Continue finasteride  Benign essential HTN- (present on admission) Blood pressure normal - Continue amlodipine  Chronic atrial fibrillation ([place])- (present on admission) Heart rate controlled - Continue apixaban        Subjective: Dose of Sinemet was delayed this morning is very sleepy and sedated afterwards.  No further fever.  He perseverates on his kidney cysts.  He has some mild pain in the left side, no confusion more than his baseline.     Physical Exam: Vitals:   11/27/21 1604 11/27/21 1946 11/28/21 0501 11/28/21 0726  BP: 134/70 119/73 124/75 136/75  Pulse: 66 79 89 90  Resp: 18 20 20 18   Temp: 97.8 F (36.6 C) 97.6 F (36.4 C) 98 F (36.7 C) 97.7 F (36.5 C)  TempSrc:  Oral Axillary Oral  SpO2: 97% 96% 97% 95%  Weight:      Height:       Elderly adult male, lying in bed, sleeping, but arouses easily, interactive, appropriate Heart rate regular, no murmurs, no lower extremity edema, no JVD Lung sounds diminished, but no rales or wheezes appreciated on my exam Abdominal exam normal, soft, some tenderness with deep palpation on the left side Attentive to conversation, but somewhat distracted at times, bradykinesia, generalized weakness, speech is somewhat slurred at baseline    Data Reviewed: My review of labs and imaging is notable for creatinine down to 2.44, WBC down to 15, sodium 133 -  Discussed with nephrology    Family Communication: Wife and son at the bedside  Disposition: Status is: Inpatient Remains inpatient appropriate because:   Creatinine still elevated over 2, more than doubled from baseline, seems to be improving off of IV fluids.  However he still has a white blood cell count greater than 15 and some tenderness in the  left side, so I will continue IV antibiotics for 1 more day, hopefully transition to oral antibiotics tomorrow with discharge home on prolonged course of Abx         Planned Discharge Destination: Home     Author: Edwin Dada, MD 11/28/2021 2:56 PM  For on call review [email or web address].

## 2021-11-28 NOTE — Progress Notes (Signed)
[facility]  ROUNDING NOTE   Subjective:   Patient laying in bed Wife at bedside states he's not waking up as normal.  Observed with tremors and disorganized movements Taking gown off Responds to simple commands, open eyes/squeeze hands  Creatinine 2.44 Urine output 2.7L in 24 hours Sodium 133  Objective:  Vital signs in last 24 hours:  Temp:  [97.6 F (36.4 C)-98 F (36.7 C)] 97.7 F (36.5 C) (02/12 0726) Pulse Rate:  [66-90] 90 (02/12 0726) Resp:  [18-20] 18 (02/12 0726) BP: (119-153)/(69-75) 136/75 (02/12 0726) SpO2:  [95 %-97 %] 95 % (02/12 0726)  Weight change:  Filed Weights   11/25/21 0900  Weight: 68.5 kg    Intake/Output: I/O last 3 completed shifts: In: 400 [P.O.:400] Out: 2100 [Urine:2100]   Intake/Output this shift:  No intake/output data recorded.  Physical Exam: General: NAD, restless  Head: Normocephalic, atraumatic. Moist oral mucosal membranes  Eyes: Anicteric  Lungs:  Clear to auscultation, normal effort  Heart: Regular rate and rhythm  Abdomen:  Soft, nontender  Extremities: No peripheral edema.  Neurologic: Nonfocal, moving all four extremities  Skin: No lesions       Basic Metabolic Panel: Recent Labs  Lab 11/25/21 0946 11/26/21 0447 11/27/21 0514 11/28/21 0347  NA 129* 132* 134* 133*  K 4.3 3.9 3.7 3.5  CL 96* 100 103 102  CO2 22 22 19* 20*  GLUCOSE 161* 147* 159* 151*  BUN 86* 91* 85* 81*  CREATININE 3.16* 2.99* 2.68* 2.44*  CALCIUM 8.4* 8.3* 8.2* 8.2*  MG 2.3  --   --   --      Liver Function Tests: Recent Labs  Lab 11/25/21 0946 11/26/21 0447  AST 13* 10*  ALT <5 <5  ALKPHOS 145* 170*  BILITOT 1.3* 1.0  PROT 6.3* 5.8*  ALBUMIN 2.5* 2.3*    Recent Labs  Lab 11/25/21 0946  LIPASE 24    No results for input(s): AMMONIA in the last 168 hours.  CBC: Recent Labs  Lab 11/25/21 0946 11/26/21 0447 11/27/21 0514 11/28/21 0347  WBC 24.0* 20.2* 20.7* 15.9*  NEUTROABS 21.8*  --   --   --    HGB 12.1* 12.2* 12.2* 12.3*  HCT 36.6* 36.8* 37.2* 38.2*  MCV 89.3 89.1 88.8 88.2  PLT 203 240 289 304     Cardiac Enzymes: No results for input(s): CKTOTAL, CKMB, CKMBINDEX, TROPONINI in the last 168 hours.  BNP: Invalid input(s): POCBNP  CBG: Recent Labs  Lab 11/25/21 1948 11/25/21 2329  GLUCAP 169* 150*     Microbiology: Results for orders placed or performed during the hospital encounter of 11/25/21  Resp Panel by RT-PCR (Flu A&B, Covid) Nasopharyngeal Swab     Status: None   Collection Time: 11/25/21  9:46 AM   Specimen: Nasopharyngeal Swab; Nasopharyngeal(NP) swabs in vial transport medium  Result Value Ref Range Status   SARS Coronavirus 2 by RT PCR NEGATIVE NEGATIVE Final    Comment: (NOTE) SARS-CoV-2 target nucleic acids are NOT DETECTED.  The SARS-CoV-2 RNA is generally detectable in upper respiratory specimens during the acute phase of infection. The lowest concentration of SARS-CoV-2 viral copies this assay can detect is 138 copies/mL. A negative result does not preclude SARS-Cov-2 infection and should not be used as the sole basis for treatment or other patient management decisions. A negative result may occur with  improper specimen collection/handling, submission of specimen other than nasopharyngeal swab, presence of viral mutation(s) within the areas targeted by this assay,  and inadequate number of viral copies(<138 copies/mL). A negative result must be combined with clinical observations, patient history, and epidemiological information. The expected result is Negative.  Fact Sheet for Patients:  EntrepreneurPulse.com.au  Fact Sheet for Healthcare Providers:  IncredibleEmployment.be  This test is no t yet approved or cleared by the [place] FDA and  has been authorized for detection and/or diagnosis of SARS-CoV-2 by FDA under an Emergency Use Authorization (EUA). This EUA will remain  in effect (meaning  this test can be used) for the duration of the COVID-19 declaration under Section 564(b)(1) of the Act, 21 U.S.C.section 360bbb-3(b)(1), unless the authorization is terminated  or revoked sooner.       Influenza A by PCR NEGATIVE NEGATIVE Final   Influenza B by PCR NEGATIVE NEGATIVE Final    Comment: (NOTE) The Xpert Xpress SARS-CoV-2/FLU/RSV plus assay is intended as an aid in the diagnosis of influenza from Nasopharyngeal swab specimens and should not be used as a sole basis for treatment. Nasal washings and aspirates are unacceptable for Xpert Xpress SARS-CoV-2/FLU/RSV testing.  Fact Sheet for Patients: EntrepreneurPulse.com.au  Fact Sheet for Healthcare Providers: IncredibleEmployment.be  This test is not yet approved or cleared by the [place] FDA and has been authorized for detection and/or diagnosis of SARS-CoV-2 by FDA under an Emergency Use Authorization (EUA). This EUA will remain in effect (meaning this test can be used) for the duration of the COVID-19 declaration under Section 564(b)(1) of the Act, 21 U.S.C. section 360bbb-3(b)(1), unless the authorization is terminated or revoked.  Performed at [facility], [address] [place], [place] [address]   Blood culture (routine x 2)     Status: None (Preliminary result)   Collection Time: 11/25/21 11:43 AM   Specimen: BLOOD  Result Value Ref Range Status   Specimen Description BLOOD BLOOD RIGHT FOREARM  Final   Special Requests   Final    BOTTLES DRAWN AEROBIC AND ANAEROBIC Blood Culture adequate volume   Culture   Final    NO GROWTH 3 DAYS Performed at [facility], [address], [place], [place] [address]    Report Status PENDING  Incomplete  Blood culture (routine x 2)     Status: None (Preliminary result)   Collection Time: 11/25/21 11:43 AM   Specimen: BLOOD  Result Value Ref Range Status   Specimen Description BLOOD BLOOD LEFT HAND  Final    Special Requests   Final    BOTTLES DRAWN AEROBIC AND ANAEROBIC Blood Culture adequate volume   Culture   Final    NO GROWTH 3 DAYS Performed at [facility], [address], [place], [place] [address]    Report Status PENDING  Incomplete  Urine Culture     Status: Abnormal   Collection Time: 11/25/21 12:27 PM   Specimen: Urine, Clean Catch  Result Value Ref Range Status   Specimen Description   Final    URINE, CLEAN CATCH Performed at [facility], [address], [place], [place] [address]    Special Requests   Final    NONE Performed at [facility], [address] [place], [place] [address]    Culture >=100,000 COLONIES/mL CITROBACTER KOSERI (A)  Final   Report Status 11/27/2021 FINAL  Final   Organism ID, Bacteria CITROBACTER KOSERI (A)  Final      Susceptibility   Citrobacter koseri - MIC*    CEFAZOLIN <=4 SENSITIVE Sensitive     CEFEPIME <=0.12 SENSITIVE Sensitive  CEFTRIAXONE <=0.25 SENSITIVE Sensitive     CIPROFLOXACIN <=0.25 SENSITIVE Sensitive     GENTAMICIN <=1 SENSITIVE Sensitive     IMIPENEM <=0.25 SENSITIVE Sensitive     NITROFURANTOIN <=16 SENSITIVE Sensitive     TRIMETH/SULFA <=20 SENSITIVE Sensitive     PIP/TAZO <=4 SENSITIVE Sensitive     * >=100,000 COLONIES/mL CITROBACTER KOSERI    Coagulation Studies: Recent Labs    11/26/21 0447  LABPROT 24.4*  INR 2.2*     Urinalysis: Recent Labs    11/25/21 1227  COLORURINE YELLOW*  LABSPEC 1.014  PHURINE 5.0  GLUCOSEU NEGATIVE  HGBUR SMALL*  BILIRUBINUR NEGATIVE  KETONESUR NEGATIVE  PROTEINUR 100*  NITRITE NEGATIVE  LEUKOCYTESUR MODERATE*       Imaging: No results found.   Medications:    cefTRIAXone (ROCEPHIN)  IV Stopped (11/27/21 1954)    amLODipine  2.5 mg Oral BID   apixaban  2.5 mg Oral BID   carbidopa-levodopa  2 tablet Oral QID   ferrous gluconate  324 mg Oral Q breakfast   finasteride  5 mg Oral Daily   mirtazapine  7.5 mg Oral QHS    pantoprazole  40 mg Oral Daily   selegiline  5 mg Oral BID   acetaminophen, ondansetron **OR** ondansetron (ZOFRAN) IV  Assessment/ Plan:  Mr. Jonathon Welch is a 86 y.o.  male with polycystic kidney disease, systolic congestive heart failure EFof 30-35%, atrial fibrillation, aortic valve disease with aortic valve replacement, hypertension, Parkinson's, Dementia, migraines, , who was admitted to [facility] on 11/25/2021 for Pyelonephritis [N12] Troponin I above reference range [R77.8] AKI (acute kidney injury) ([place]) [N17.9] Sepsis ([place]) [A41.9]   Acute kidney injury on chronic kidney disease stage IIIB: baseline creatinine of 1.7, GFR of 40 04/01/21. Acute kidney injury secondary to pyelonephritis and tubulitis. Chronic kidney disease secondary to Polycystic kidney disease, hypertension, vascular disease and acute kidney injuries with limited recovery. No IV contrast exposure.  - holding furosemide - Creatinine continues to  improve 2.44 -Adequate urine output recorded -Will schedule a follow up at discharge with our office.  Lab Results  Component Value Date   CREATININE 2.44 (H) 11/28/2021   CREATININE 2.68 (H) 11/27/2021   CREATININE 2.99 (H) 11/26/2021    Intake/Output Summary (Last 24 hours) at 11/28/2021 1018 Last data filed at 11/28/2021 4920 Gross per 24 hour  Intake 180 ml  Output 1400 ml  Net -1220 ml    Pyelonephritis with sepsis: on ceftriaxone. Urine culture from 2/6 shows citrobacter koseri with pansensitivity. No stones identified on imaging.  - Receiving Ceftriaxone    Hyponatremia: secondary to acute kidney injury. Improving with improved PO intake and IV fluids.   Sodium 133   Hypertension: current regimen of amlodipine and sinemet.      LOS: 3   2/12/202310:18 AM

## 2021-11-28 NOTE — Assessment & Plan Note (Signed)
-   Follow up with Urologist

## 2021-11-29 DIAGNOSIS — G9340 Encephalopathy, unspecified: Secondary | ICD-10-CM

## 2021-11-29 LAB — CBC
HCT: 43.2 % (ref 39.0–52.0)
Hemoglobin: 13.7 g/dL (ref 13.0–17.0)
MCH: 29 pg (ref 26.0–34.0)
MCHC: 31.7 g/dL (ref 30.0–36.0)
MCV: 91.5 fL (ref 80.0–100.0)
Platelets: 341 10*3/uL (ref 150–400)
RBC: 4.72 MIL/uL (ref 4.22–5.81)
RDW: 14.3 % (ref 11.5–15.5)
WBC: 17.5 10*3/uL — ABNORMAL HIGH (ref 4.0–10.5)
nRBC: 0 % (ref 0.0–0.2)

## 2021-11-29 LAB — BASIC METABOLIC PANEL
Anion gap: 12 (ref 5–15)
BUN: 72 mg/dL — ABNORMAL HIGH (ref 8–23)
CO2: 22 mmol/L (ref 22–32)
Calcium: 8.4 mg/dL — ABNORMAL LOW (ref 8.9–10.3)
Chloride: 100 mmol/L (ref 98–111)
Creatinine, Ser: 2.36 mg/dL — ABNORMAL HIGH (ref 0.61–1.24)
GFR, Estimated: 27 mL/min — ABNORMAL LOW (ref >60)
Glucose, Bld: 158 mg/dL — ABNORMAL HIGH (ref 70–99)
Potassium: 3.7 mmol/L (ref 3.5–5.1)
Sodium: 134 mmol/L — ABNORMAL LOW (ref 135–145)

## 2021-11-29 MED ORDER — SULFAMETHOXAZOLE-TRIMETHOPRIM 800-160 MG PO TABS: 1.0000 | ORAL_TABLET | Freq: Two times a day (BID) | ORAL | 0 refills | Status: AC

## 2021-11-29 MED ORDER — PANTOPRAZOLE SODIUM 40 MG PO TBEC: 40.0000 mg | DELAYED_RELEASE_TABLET | Freq: Every day | ORAL | 3 refills | Status: AC

## 2021-11-29 NOTE — Care Management Important Message (Signed)
Important Message  Patient Details  Name: MCADOO MUZQUIZ MRN: [id number] Date of Birth: Jan 29, 1936   Medicare Important Message Given:  Yes     Dannette Barbara 11/29/2021, 10:52 AM

## 2021-11-29 NOTE — Progress Notes (Signed)
Pt discharged to home. Left unit in wheelchair pushed by hospital volunteer accompanied by wife and son. Left in stable condition. VWilliams,RN.

## 2021-11-29 NOTE — TOC Transition Note (Signed)
Transition of Care [facility]) - CM/SW Discharge Note   Patient Details  Name: Jonathon Welch MRN: [id number] Date of Birth: 07-13-1936  [facility] of [facility] CM/SW Contact:  Candie Chroman, LCSW Phone Number: 11/29/2021, 10:18 AM   Clinical Narrative:  Patient has orders to discharge home today. No further concerns. CSW signing off.   Final next level of care: Home/Self Care Barriers to Discharge: Barriers Resolved   Patient Goals and CMS Choice Patient states their goals for this hospitalization and ongoing recovery are:: to go home CMS Medicare.gov Compare Post Acute Care list provided to:: Patient Represenative (must comment) (spouse) Choice offered to / list presented to : Spouse  Discharge Placement                    Patient and family notified of of transfer: 11/29/21  Discharge Plan and Services                                     Social Determinants of Health (SDOH) Interventions     Readmission Risk Interventions No flowsheet data found.

## 2021-11-29 NOTE — Discharge Summary (Signed)
Physician Discharge Summary   Patient: GLENDALE YOUNGBLOOD MRN: [id number] DOB: Jul 16, 1936  Admit date:     11/25/2021  Discharge date: 11/29/21  Discharge Physician: Edwin Dada   PCP: Rusty Aus, MD   Recommendations at discharge:  Follow upw with PCP Dr. Sabra Heck in 1 week Dr. Sabra Heck: Please check SCr (discharge Cr 2.3) Follow up with Dr. Juleen China Nephrology as directed Dr. Juleen China or Dr. Sabra Heck: please advise patient when to resume diuretics Discussed large symptomatic right kidney cyst with IR, Dr. Ronny Bacon: If deemed reasonable by PCP or Nephrology, right kidney cyst drainage via image guidance would be reasonable     Discharge Diagnoses: Principal Problem:   Sepsis [facility]) Active Problems:   Acute pyelonephritis   Acute renal failure superimposed on stage 3b chronic kidney disease ([place])   Chronic atrial fibrillation (HCC)   Benign essential HTN   BPH (benign prostatic hyperplasia)   Parkinson's disease ([place])   Acquired thrombophilia ([place])   Stage 3b chronic kidney disease (CKD) ([place])   Chronic systolic CHF (congestive heart failure) ([place])   Polycystic kidney disease   Dementia without behavioral disturbance [facility])        Hospital Course: Mr. Vanderpol is an 86 y.o. 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  Initially, developed SOB, peripheral edema, 10lb weight gain about 1 month ago.  Treated with oral Lasix, at PCP's office, edema seemed to resolve.  Then in last week, developed sluggishness, weakness.  Went to PCP's office, Cr up to 3.3, nursing unable to obtain BP, sent to the ER.  Noted to have recent culture of urine with Citrobacter.  In the ER, BP normal.  CT abdomen and pelvis consistent with UTI.  BNP >1000.  CXR with cephalization of vessels,  no overt edema.        Assessment and Plan: * Sepsis ([place])- (present on admission) Acute LEFT pyelonephritis Presented with tachypnea, leukocytosis, renal failure, elevated troponin.  CT showing left-sided perinephric stranding, recent urine culture growing Citrobacter.    Urine culture here growing Citrobacter, pansensitive Sepsis physiology resolved.  Treated with 4 days Rocephin.   HR and RR normal, afebrile, taking PO well.  Transitioned to Bactrim to compltee 14 days.   Acute renal failure superimposed on stage 3b chronic kidney disease (HCC) Baseline Cr ~1.5-1.8.  Here, up to 3.3 on admission.  Treated with fluids and this improved to close to baseline, 2.3 on discharge.   Polycystic kidney disease Symptomatic RIGHT kidney cyst Patient reports symptoms of dyspnea ("can't a deep breath") and early satiety and ?constipation which he attributes to his large right kidney cyst.    Asked about drainage.  I discussed this with IR, who felt this was better left to the discretion of PCP or Nephrology.    Chronic systolic CHF (congestive heart failure) (HCC) Acute CHF ruled out.  Diuretics held due to AKI.  Needs close PCP follow up for diuretic management.  Stage 3b chronic kidney disease (CKD) ([place])          Pain control - [place] Controlled Substance Reporting System database was reviewed.   Consultants: Nephrology Procedures performed: None  Disposition: Home Diet recommendation:  Regular diet  DISCHARGE MEDICATION: Allergies as of 11/29/2021   No Active Allergies  Medication List     STOP taking these medications    furosemide 20 MG tablet Commonly known as: LASIX   metolazone 2.5 MG tablet Commonly known as: ZAROXOLYN   mirtazapine 7.5 MG tablet Commonly known as: REMERON   modafinil 100 MG tablet Commonly known as: PROVIGIL   omeprazole 40 MG capsule Commonly known as: PRILOSEC Replaced by: pantoprazole 40 MG tablet       TAKE  these medications    acetaminophen 500 MG tablet Commonly known as: TYLENOL Take 500 mg by mouth daily as needed for moderate pain or headache.   amLODipine 2.5 MG tablet Commonly known as: NORVASC Take by mouth.   apixaban 2.5 MG Tabs tablet Commonly known as: ELIQUIS Take 2.5 mg by mouth 2 (two) times daily.   carbidopa-levodopa 50-200 MG tablet Commonly known as: SINEMET CR Take 1 tablet by mouth at bedtime.   carbidopa-levodopa 25-100 MG tablet Commonly known as: SINEMET IR Take 2 tablets by mouth 4 (four) times daily.   CRANBERRY PO Take by mouth.   [employer] PROSTATE MAX + PO Take by mouth. beta-sitos/D3/minerals/cranber (PROSTATE MAX PLUS ORAL)   ferrous gluconate 324 MG tablet Commonly known as: FERGON Take 324 mg by mouth daily with breakfast.   finasteride 5 MG tablet Commonly known as: PROSCAR Take 1 tablet (5 mg total) by mouth daily.   pantoprazole 40 MG tablet Commonly known as: PROTONIX Take 1 tablet (40 mg total) by mouth daily. Replaces: omeprazole 40 MG capsule   selegiline 5 MG capsule Commonly known as: ELDEPRYL Take 5 mg by mouth 2 (two) times daily.   sulfamethoxazole-trimethoprim 800-160 MG tablet Commonly known as: BACTRIM DS Take 1 tablet by mouth 2 (two) times daily for 10 days.        Follow-up Information     Rusty Aus, MD. Go on 12/08/2021.   Specialty: Internal Medicine Why: And get kidney function checked.4:15pm appointment Contact information: [place] [address] [phone]         Lavonia Dana, MD. Schedule an appointment as soon as possible for a visit in 2 week(s).   Specialty: Nephrology Why: they will call the patient Contact information: [place] [address] [phone]                Discharge Instructions     Discharge instructions   Complete by: As directed    From Dr. Loleta Books: You were admitted  for renal failure and urinary tract infection. For the renal failure, you were treated with fluids and your kidney function got slightly better.  For the urinary tract infection (kidney infection or pyelonephritis) you were treated with Rocephin.  You should complete treatment for the kidney infection with Bactrim DS twice daily for 10 more days.  For the kidneys: HOLD your furosemide and metolazone for now Go see Dr. Sabra Heck in 1 week and have him check labs Go see Dr. Juleen China the kidney specialist in 2-4 weeks, have him check labs as well  Do not resume furosemide or metolazone until Dr. Sabra Heck OR Dr. Juleen China tell you to  Ask them also about aspiration of the cysts. Lastly, STOP taking omeprazole. There is rarely some "intersitial nephritis" from omeprazole that can cause kidney injury If you can do without it, just stop.  But if you need an antacid medicine, I have written a prescription for pantoprazole which is an equivalent antacid to omeprazole but doesn't cause this  kidney risk.  Or you could replace it with Pepcid which is also the same.   Increase activity slowly   Complete by: As directed        Discharge Exam: Filed Weights   11/25/21 0900  Weight: 68.5 kg   General: Elderly adult male, sitting up in bed, pt is alert, awake, not in acute distress Cardiovascular: RRR, nl S1-S2, no murmurs appreciated.   No LE edema.   Respiratory: Normal respiratory rate and rhythm.  CTAB without rales or wheezes. Abdominal: Abdomen soft and non-tender.  No distension or HSM.   Neuro/Psych: Bradykinesia noted, speech slurred strength symmetric in upper and lower extremities.  Judgment and insight appear impaired by dementia.   Condition at discharge: fair  The results of significant diagnostics from this hospitalization (including imaging, microbiology, ancillary and laboratory) are listed below for reference.   Imaging Studies: CT ABDOMEN PELVIS WO CONTRAST  Result Date:  11/25/2021 CLINICAL DATA:  Abdominal pain EXAM: CT ABDOMEN AND PELVIS WITHOUT CONTRAST TECHNIQUE: Multidetector CT imaging of the abdomen and pelvis was performed following the standard protocol without IV contrast. RADIATION DOSE REDUCTION: This exam was performed according to the departmental dose-optimization program which includes automated exposure control, adjustment of the mA and/or kV according to patient size and/or use of iterative reconstruction technique. COMPARISON:  CT abdomen and pelvis dated Mar 16, 2020; MRI abdomen dated April 09, 2021 FINDINGS: Lower chest: Cardiomegaly with coronary artery and mitral annular calcifications. Small bilateral pleural effusions and atelectasis. Hepatobiliary: Numerous low-attenuation lesions are seen throughout the liver, but most pronounced in the left lobe of the liver, unchanged compared to prior and likely simple cysts. Cholelithiasis with no gallbladder wall thickening. Cystic lesion of the head of the pancreas measuring 2.0 cm Pancreas: Unchanged pancreatic head cyst measuring 2.1 cm. No peripancreatic inflammation. Spleen: Normal in size without focal abnormality. Adrenals/Urinary Tract: Bilateral adrenal glands are unremarkable. New left perinephric fat stranding. A complex cyst of the lower pole of the left kidney is decreased in size when compared with prior exam, measuring up to 2.0 cm on series 5, image 32, previously measured up to 5.1 cm. Additional numerous cystic lesions are seen throughout the bilateral kidneys, most are compatible with simple cysts, others demonstrate high density, likely due to proteinaceous material. Bladder is unremarkable. Stomach/Bowel: Small hiatal hernia. Stomach is otherwise unremarkable. No bowel wall thickening, inflammatory change or evidence of obstruction. Appendix is not definitely visualized, although there are no secondary findings of acute appendicitis. Vascular/Lymphatic: Aortic atherosclerosis. No enlarged abdominal  or pelvic lymph nodes. Reproductive: Prostate is unremarkable. Other: Small umbilical hernia containing fat and fluid. Trace pelvic free fluid. Musculoskeletal: No acute or significant osseous findings. IMPRESSION: 1. New left perinephric fat stranding. A complex cyst of the lower pole the left kidney is decreased in size when compared with prior exam. Findings may be due to ruptured cyst, although infection is an additional consideration. Recommend correlation with urinalysis. 2. Findings compatible with polycystic kidney disease including numerous hepatic and renal cysts. 3. Stable cystic lesion of the head of the pancreas, previously evaluated on prior MRI. 4.  Aortic Atherosclerosis (ICD10-I70.0). Electronically Signed   By: Yetta Glassman M.D.   On: 11/25/2021 10:32   DG Chest Portable 1 View  Result Date: 11/25/2021 CLINICAL DATA:  Congestive heart failure. Hypertensive earlier today. History of CABG and aortic valve replacement. EXAM: PORTABLE CHEST 1 VIEW COMPARISON:  AP chest 03/01/2021 FINDINGS: Status post median sternotomy. Mildly to moderately enlarged cardiac silhouette,  unchanged. Likely mitral annular calcification. Mild calcification within aortic arch. Mildly decreased lung volumes with bronchovascular crowding. Cephalization of the pulmonary vasculature without overt pulmonary edema. No pleural effusion or pneumothorax. No acute skeletal abnormality. IMPRESSION: Cephalization of the pulmonary vasculature without overt pulmonary edema. Electronically Signed   By: Yvonne Kendall M.D.   On: 11/25/2021 09:51   ECHOCARDIOGRAM COMPLETE  Result Date: 11/25/2021    ECHOCARDIOGRAM REPORT   Patient Name:   JAMESMICHAEL SHADD Date of Exam: 11/25/2021 Medical Rec #:  [id number]        Height:       68.0 in Accession #:    [id number]       Weight:       151.0 lb Date of Birth:  [birth year]        BSA:          1.814 m Patient Age:    86 years         BP:           143/121 mmHg Patient Gender: M                 HR:           72 bpm. Exam Location:  [facility] Procedure: 2D Echo, Color Doppler and Cardiac Doppler STAT ECHO Indications:     I50.21 congestive heart failure-Acute Systolic  History:         Patient has no prior history of Echocardiogram examinations.                  AVR, Arrythmias:Atrial Fibrillation, Signs/Symptoms:Shortness                  of Breath; Risk Factors:Hypertension and Dyslipidemia.  Sonographer:     Charmayne Sheer Referring Phys:  [id number] Ida Rogue SMITH Diagnosing Phys: Isaias Cowman MD  Sonographer Comments: Suboptimal subcostal window. IMPRESSIONS  1. Left ventricular ejection fraction, by estimation, is 30 to 35%. The left ventricle has moderately decreased function. The left ventricle has no regional wall motion abnormalities. Left ventricular diastolic parameters were normal.  2. Right ventricular systolic function is normal. The right ventricular size is normal.  3. The mitral valve is normal in structure. Mild mitral valve regurgitation. No evidence of mitral stenosis.  4. The aortic valve is abnormal. There is mild thickening of the aortic valve. Aortic valve regurgitation is trivial. No aortic stenosis is present. Echo findings are consistent with normal structure and function of the aortic valve prosthesis.  5. The inferior vena cava is normal in size with greater than 50% respiratory variability, suggesting right atrial pressure of 3 mmHg. FINDINGS  Left Ventricle: Left ventricular ejection fraction, by estimation, is 30 to 35%. The left ventricle has moderately decreased function. The left ventricle has no regional wall motion abnormalities. The left ventricular internal cavity size was normal in size. There is no left ventricular hypertrophy. Left ventricular diastolic parameters were normal. Right Ventricle: The right ventricular size is normal. No increase in right ventricular wall thickness. Right ventricular systolic function is normal. Left Atrium: Left atrial size was normal in  size. Right Atrium: Right atrial size was normal in size. Pericardium: There is no evidence of pericardial effusion. Mitral Valve: The mitral valve is normal in structure. There is moderate thickening of the mitral valve leaflet(s). Mild to moderate mitral annular calcification. Mild mitral valve regurgitation. No evidence of mitral valve stenosis. MV peak gradient, 4.2 mmHg. The mean mitral valve gradient is 2.0 mmHg. Tricuspid  Valve: The tricuspid valve is normal in structure. Tricuspid valve regurgitation is mild . No evidence of tricuspid stenosis. Aortic Valve: The aortic valve is abnormal. There is mild thickening of the aortic valve. Aortic valve regurgitation is trivial. No aortic stenosis is present. Aortic valve mean gradient measures 12.0 mmHg. Aortic valve peak gradient measures 23.2 mmHg. Aortic valve area, by VTI measures 2.51 cm. There is a bioprosthetic valve present in the aortic position. Echo findings are consistent with normal structure and function of the aortic valve prosthesis. Pulmonic Valve: The pulmonic valve was normal in structure. Pulmonic valve regurgitation is not visualized. No evidence of pulmonic stenosis. Aorta: The aortic root is normal in size and structure. Venous: The inferior vena cava is normal in size with greater than 50% respiratory variability, suggesting right atrial pressure of 3 mmHg. IAS/Shunts: No atrial level shunt detected by color flow Doppler.  LEFT VENTRICLE PLAX 2D LVIDd:         4.89 cm   Diastology LVIDs:         4.23 cm   LV e' medial:    4.13 cm/s LV PW:         1.20 cm   LV E/e' medial:  29.1 LV IVS:        1.48 cm   LV e' lateral:   7.07 cm/s LVOT diam:     2.30 cm   LV E/e' lateral: 17.0 LV SV:         96 LV SV Index:   53 LVOT Area:     4.15 cm  RIGHT VENTRICLE RV Basal diam:  4.00 cm LEFT ATRIUM              Index        RIGHT ATRIUM           Index LA diam:        5.20 cm  2.87 cm/m   RA Area:     16.00 cm LA Vol (A2C):   119.0 ml 65.61 ml/m  RA  Volume:   41.40 ml  22.83 ml/m LA Vol (A4C):   79.4 ml  43.78 ml/m LA Biplane Vol: 99.1 ml  54.64 ml/m  AORTIC VALVE                     PULMONIC VALVE AV Area (Vmax):    2.41 cm      PV Vmax:       1.23 m/s AV Area (Vmean):   2.29 cm      PV Vmean:      72.200 cm/s AV Area (VTI):     2.51 cm      PV VTI:        0.215 m AV Vmax:           241.00 cm/s   PV Peak grad:  6.1 mmHg AV Vmean:          162.000 cm/s  PV Mean grad:  3.0 mmHg AV VTI:            0.384 m AV Peak Grad:      23.2 mmHg AV Mean Grad:      12.0 mmHg LVOT Vmax:         140.00 cm/s LVOT Vmean:        89.100 cm/s LVOT VTI:          0.232 m LVOT/AV VTI ratio: 0.60  AORTA Ao Root diam: 3.90 cm MITRAL VALVE MV Area (PHT):  4.46 cm     SHUNTS MV Area VTI:   3.09 cm     Systemic VTI:  0.23 m MV Peak grad:  4.2 mmHg     Systemic Diam: 2.30 cm MV Mean grad:  2.0 mmHg MV Vmax:       1.03 m/s MV Vmean:      71.3 cm/s MV Decel Time: 170 msec MV E velocity: 120.00 cm/s MV A velocity: 96.10 cm/s MV E/A ratio:  1.25 Isaias Cowman MD Electronically signed by Isaias Cowman MD Signature Date/Time: 11/25/2021/3:34:22 PM    Final     Microbiology: Results for orders placed or performed during the hospital encounter of 11/25/21  Resp Panel by RT-PCR (Flu A&B, Covid) Nasopharyngeal Swab     Status: None   Collection Time: 11/25/21  9:46 AM   Specimen: Nasopharyngeal Swab; Nasopharyngeal(NP) swabs in vial transport medium  Result Value Ref Range Status   SARS Coronavirus 2 by RT PCR NEGATIVE NEGATIVE Final    Comment: (NOTE) SARS-CoV-2 target nucleic acids are NOT DETECTED.  The SARS-CoV-2 RNA is generally detectable in upper respiratory specimens during the acute phase of infection. The lowest concentration of SARS-CoV-2 viral copies this assay can detect is 138 copies/mL. A negative result does not preclude SARS-Cov-2 infection and should not be used as the sole basis for treatment or other patient management decisions. A negative result  may occur with  improper specimen collection/handling, submission of specimen other than nasopharyngeal swab, presence of viral mutation(s) within the areas targeted by this assay, and inadequate number of viral copies(<138 copies/mL). A negative result must be combined with clinical observations, patient history, and epidemiological information. The expected result is Negative.  Fact Sheet for Patients:  EntrepreneurPulse.com.au  Fact Sheet for Healthcare Providers:  IncredibleEmployment.be  This test is no t yet approved or cleared by the [place] FDA and  has been authorized for detection and/or diagnosis of SARS-CoV-2 by FDA under an Emergency Use Authorization (EUA). This EUA will remain  in effect (meaning this test can be used) for the duration of the COVID-19 declaration under Section 564(b)(1) of the Act, 21 U.S.C.section 360bbb-3(b)(1), unless the authorization is terminated  or revoked sooner.       Influenza A by PCR NEGATIVE NEGATIVE Final   Influenza B by PCR NEGATIVE NEGATIVE Final    Comment: (NOTE) The Xpert Xpress SARS-CoV-2/FLU/RSV plus assay is intended as an aid in the diagnosis of influenza from Nasopharyngeal swab specimens and should not be used as a sole basis for treatment. Nasal washings and aspirates are unacceptable for Xpert Xpress SARS-CoV-2/FLU/RSV testing.  Fact Sheet for Patients: EntrepreneurPulse.com.au  Fact Sheet for Healthcare Providers: IncredibleEmployment.be  This test is not yet approved or cleared by the [place] FDA and has been authorized for detection and/or diagnosis of SARS-CoV-2 by FDA under an Emergency Use Authorization (EUA). This EUA will remain in effect (meaning this test can be used) for the duration of the COVID-19 declaration under Section 564(b)(1) of the Act, 21 U.S.C. section 360bbb-3(b)(1), unless the authorization is terminated  or revoked.  Performed at [facility], [address] [place], [place] [address]   Blood culture (routine x 2)     Status: None (Preliminary result)   Collection Time: 11/25/21 11:43 AM   Specimen: BLOOD  Result Value Ref Range Status   Specimen Description BLOOD BLOOD RIGHT FOREARM  Final   Special Requests   Final    BOTTLES DRAWN AEROBIC AND ANAEROBIC Blood Culture  adequate volume   Culture   Final    NO GROWTH 4 DAYS Performed at [facility], [address] [place], [place] [address]    Report Status PENDING  Incomplete  Blood culture (routine x 2)     Status: None (Preliminary result)   Collection Time: 11/25/21 11:43 AM   Specimen: BLOOD  Result Value Ref Range Status   Specimen Description BLOOD BLOOD LEFT HAND  Final   Special Requests   Final    BOTTLES DRAWN AEROBIC AND ANAEROBIC Blood Culture adequate volume   Culture   Final    NO GROWTH 4 DAYS Performed at [facility], [address] [place], [place] [address]    Report Status PENDING  Incomplete  Urine Culture     Status: Abnormal   Collection Time: 11/25/21 12:27 PM   Specimen: Urine, Clean Catch  Result Value Ref Range Status   Specimen Description   Final    URINE, CLEAN CATCH Performed at [facility], [address] [place], [place] [address]    Special Requests   Final    NONE Performed at [facility], [address] [place], [place] [address]    Culture >=100,000 COLONIES/mL CITROBACTER KOSERI (A)  Final   Report Status 11/27/2021 FINAL  Final   Organism ID, Bacteria CITROBACTER KOSERI (A)  Final      Susceptibility   Citrobacter koseri - MIC*    CEFAZOLIN <=4 SENSITIVE Sensitive     CEFEPIME <=0.12 SENSITIVE Sensitive     CEFTRIAXONE <=0.25 SENSITIVE Sensitive     CIPROFLOXACIN <=0.25 SENSITIVE Sensitive     GENTAMICIN <=1 SENSITIVE Sensitive     IMIPENEM <=0.25 SENSITIVE Sensitive     NITROFURANTOIN <=16 SENSITIVE Sensitive      TRIMETH/SULFA <=20 SENSITIVE Sensitive     PIP/TAZO <=4 SENSITIVE Sensitive     * >=100,000 COLONIES/mL CITROBACTER KOSERI    Labs: CBC: Recent Labs  Lab 11/25/21 0946 11/26/21 0447 11/27/21 0514 11/28/21 0347 11/29/21 0509  WBC 24.0* 20.2* 20.7* 15.9* 17.5*  NEUTROABS 21.8*  --   --   --   --   HGB 12.1* 12.2* 12.2* 12.3* 13.7  HCT 36.6* 36.8* 37.2* 38.2* 43.2  MCV 89.3 89.1 88.8 88.2 91.5  PLT 203 240 289 304 496   Basic Metabolic Panel: Recent Labs  Lab 11/25/21 0946 11/26/21 0447 11/27/21 0514 11/28/21 0347 11/29/21 0509  NA 129* 132* 134* 133* 134*  K 4.3 3.9 3.7 3.5 3.7  CL 96* 100 103 102 100  CO2 22 22 19* 20* 22  GLUCOSE 161* 147* 159* 151* 158*  BUN 86* 91* 85* 81* 72*  CREATININE 3.16* 2.99* 2.68* 2.44* 2.36*  CALCIUM 8.4* 8.3* 8.2* 8.2* 8.4*  MG 2.3  --   --   --   --    Liver Function Tests: Recent Labs  Lab 11/25/21 0946 11/26/21 0447  AST 13* 10*  ALT <5 <5  ALKPHOS 145* 170*  BILITOT 1.3* 1.0  PROT 6.3* 5.8*  ALBUMIN 2.5* 2.3*   CBG: Recent Labs  Lab 11/25/21 1948 11/25/21 2329 11/28/21 1708  GLUCAP 169* 150* 173*    Discharge time spent: 40 minutes.  Signed: Edwin Dada, MD [facility] 11/29/2021

## 2021-11-29 NOTE — Progress Notes (Signed)
Pt discharged to home. DC instructions given with wife and son at bedside. All of family's questions answered to their satisfaction. Family encouraged to stop by [facility] and pick up meds that were e-prescribed by Provider. Verbalized understanding.

## 2021-11-29 NOTE — Progress Notes (Signed)
[facility]  ROUNDING NOTE   Subjective:   Patient resting in bed, alert and oriented Wife and son at bedside Per wife appetite remains poor but encouraging fluid intake. Denies shortness of breath, son voices concerns that father is unable to take deep breath without pain.  Feels this may be caused by cyst seen on renal ultrasound. Patient and son expressed concern of cyst and request treatment plan   Objective:  Vital signs in last 24 hours:  Temp:  [97.5 F (36.4 C)-97.7 F (36.5 C)] 97.5 F (36.4 C) (02/13 0753) Pulse Rate:  [63-101] 67 (02/13 0753) Resp:  [18-20] 18 (02/13 0753) BP: (127-171)/(69-84) 137/81 (02/13 0753) SpO2:  [93 %-100 %] 100 % (02/13 0753)  Weight change:  Filed Weights   11/25/21 0900  Weight: 68.5 kg    Intake/Output: I/O last 3 completed shifts: In: 180 [P.O.:180] Out: 1600 [Urine:1600]   Intake/Output this shift:  No intake/output data recorded.  Physical Exam: General: NAD  Head: Normocephalic, atraumatic. Moist oral mucosal membranes  Eyes: Anicteric  Lungs:  Clear to auscultation, normal effort  Heart: Regular rate and rhythm  Abdomen:  Soft, nontender  Extremities: No peripheral edema.  Neurologic: Nonfocal, moving all four extremities  Skin: No lesions       Basic Metabolic Panel: Recent Labs  Lab 11/25/21 0946 11/26/21 0447 11/27/21 0514 11/28/21 0347 11/29/21 0509  NA 129* 132* 134* 133* 134*  K 4.3 3.9 3.7 3.5 3.7  CL 96* 100 103 102 100  CO2 22 22 19* 20* 22  GLUCOSE 161* 147* 159* 151* 158*  BUN 86* 91* 85* 81* 72*  CREATININE 3.16* 2.99* 2.68* 2.44* 2.36*  CALCIUM 8.4* 8.3* 8.2* 8.2* 8.4*  MG 2.3  --   --   --   --      Liver Function Tests: Recent Labs  Lab 11/25/21 0946 11/26/21 0447  AST 13* 10*  ALT <5 <5  ALKPHOS 145* 170*  BILITOT 1.3* 1.0  PROT 6.3* 5.8*  ALBUMIN 2.5* 2.3*    Recent Labs  Lab 11/25/21 0946  LIPASE 24    No results for input(s): AMMONIA in the last 168  hours.  CBC: Recent Labs  Lab 11/25/21 0946 11/26/21 0447 11/27/21 0514 11/28/21 0347 11/29/21 0509  WBC 24.0* 20.2* 20.7* 15.9* 17.5*  NEUTROABS 21.8*  --   --   --   --   HGB 12.1* 12.2* 12.2* 12.3* 13.7  HCT 36.6* 36.8* 37.2* 38.2* 43.2  MCV 89.3 89.1 88.8 88.2 91.5  PLT 203 240 289 304 341     Cardiac Enzymes: No results for input(s): CKTOTAL, CKMB, CKMBINDEX, TROPONINI in the last 168 hours.  BNP: Invalid input(s): POCBNP  CBG: Recent Labs  Lab 11/25/21 1948 11/25/21 2329 11/28/21 1708  GLUCAP 169* 150* 173*     Microbiology: Results for orders placed or performed during the hospital encounter of 11/25/21  Resp Panel by RT-PCR (Flu A&B, Covid) Nasopharyngeal Swab     Status: None   Collection Time: 11/25/21  9:46 AM   Specimen: Nasopharyngeal Swab; Nasopharyngeal(NP) swabs in vial transport medium  Result Value Ref Range Status   SARS Coronavirus 2 by RT PCR NEGATIVE NEGATIVE Final    Comment: (NOTE) SARS-CoV-2 target nucleic acids are NOT DETECTED.  The SARS-CoV-2 RNA is generally detectable in upper respiratory specimens during the acute phase of infection. The lowest concentration of SARS-CoV-2 viral copies this assay can detect is 138 copies/mL. A negative result does not preclude SARS-Cov-2  infection and should not be used as the sole basis for treatment or other patient management decisions. A negative result may occur with  improper specimen collection/handling, submission of specimen other than nasopharyngeal swab, presence of viral mutation(s) within the areas targeted by this assay, and inadequate number of viral copies(<138 copies/mL). A negative result must be combined with clinical observations, patient history, and epidemiological information. The expected result is Negative.  Fact Sheet for Patients:  EntrepreneurPulse.com.au  Fact Sheet for Healthcare Providers:  IncredibleEmployment.be  This test  is no t yet approved or cleared by the [place] FDA and  has been authorized for detection and/or diagnosis of SARS-CoV-2 by FDA under an Emergency Use Authorization (EUA). This EUA will remain  in effect (meaning this test can be used) for the duration of the COVID-19 declaration under Section 564(b)(1) of the Act, 21 U.S.C.section 360bbb-3(b)(1), unless the authorization is terminated  or revoked sooner.       Influenza A by PCR NEGATIVE NEGATIVE Final   Influenza B by PCR NEGATIVE NEGATIVE Final    Comment: (NOTE) The Xpert Xpress SARS-CoV-2/FLU/RSV plus assay is intended as an aid in the diagnosis of influenza from Nasopharyngeal swab specimens and should not be used as a sole basis for treatment. Nasal washings and aspirates are unacceptable for Xpert Xpress SARS-CoV-2/FLU/RSV testing.  Fact Sheet for Patients: EntrepreneurPulse.com.au  Fact Sheet for Healthcare Providers: IncredibleEmployment.be  This test is not yet approved or cleared by the [place] FDA and has been authorized for detection and/or diagnosis of SARS-CoV-2 by FDA under an Emergency Use Authorization (EUA). This EUA will remain in effect (meaning this test can be used) for the duration of the COVID-19 declaration under Section 564(b)(1) of the Act, 21 U.S.C. section 360bbb-3(b)(1), unless the authorization is terminated or revoked.  Performed at [facility], [address] [place], [place] [address]   Blood culture (routine x 2)     Status: None (Preliminary result)   Collection Time: 11/25/21 11:43 AM   Specimen: BLOOD  Result Value Ref Range Status   Specimen Description BLOOD BLOOD RIGHT FOREARM  Final   Special Requests   Final    BOTTLES DRAWN AEROBIC AND ANAEROBIC Blood Culture adequate volume   Culture   Final    NO GROWTH 4 DAYS Performed at [facility], [address], [place], [place] [address]    Report Status PENDING   Incomplete  Blood culture (routine x 2)     Status: None (Preliminary result)   Collection Time: 11/25/21 11:43 AM   Specimen: BLOOD  Result Value Ref Range Status   Specimen Description BLOOD BLOOD LEFT HAND  Final   Special Requests   Final    BOTTLES DRAWN AEROBIC AND ANAEROBIC Blood Culture adequate volume   Culture   Final    NO GROWTH 4 DAYS Performed at [facility], [address], [place], [place] [address]    Report Status PENDING  Incomplete  Urine Culture     Status: Abnormal   Collection Time: 11/25/21 12:27 PM   Specimen: Urine, Clean Catch  Result Value Ref Range Status   Specimen Description   Final    URINE, CLEAN CATCH Performed at [facility], [address] [place], [place] [address]    Special Requests   Final    NONE Performed at [facility], [address], [place], [place] [address]    Culture >=100,000 COLONIES/mL CITROBACTER KOSERI (A)  Final  Report Status 11/27/2021 FINAL  Final   Organism ID, Bacteria CITROBACTER KOSERI (A)  Final      Susceptibility   Citrobacter koseri - MIC*    CEFAZOLIN <=4 SENSITIVE Sensitive     CEFEPIME <=0.12 SENSITIVE Sensitive     CEFTRIAXONE <=0.25 SENSITIVE Sensitive     CIPROFLOXACIN <=0.25 SENSITIVE Sensitive     GENTAMICIN <=1 SENSITIVE Sensitive     IMIPENEM <=0.25 SENSITIVE Sensitive     NITROFURANTOIN <=16 SENSITIVE Sensitive     TRIMETH/SULFA <=20 SENSITIVE Sensitive     PIP/TAZO <=4 SENSITIVE Sensitive     * >=100,000 COLONIES/mL CITROBACTER KOSERI    Coagulation Studies: No results for input(s): LABPROT, INR in the last 72 hours.   Urinalysis: No results for input(s): COLORURINE, LABSPEC, PHURINE, GLUCOSEU, HGBUR, BILIRUBINUR, KETONESUR, PROTEINUR, UROBILINOGEN, NITRITE, LEUKOCYTESUR in the last 72 hours.  Invalid input(s): APPERANCEUR     Imaging: No results found.   Medications:    cefTRIAXone (ROCEPHIN)  IV 2 g (11/29/21 1038)    amLODipine  2.5 mg Oral  BID   apixaban  2.5 mg Oral BID   carbidopa-levodopa  2 tablet Oral QID   ferrous gluconate  324 mg Oral Q breakfast   finasteride  5 mg Oral Daily   mirtazapine  7.5 mg Oral QHS   pantoprazole  40 mg Oral Daily   selegiline  5 mg Oral BID   acetaminophen, ondansetron **OR** ondansetron (ZOFRAN) IV  Assessment/ Plan:  Mr. Jonathon Welch is a 86 y.o.  male with polycystic kidney disease, systolic congestive heart failure EFof 30-35%, atrial fibrillation, aortic valve disease with aortic valve replacement, hypertension, Parkinson's, Dementia, migraines, , who was admitted to [facility] on 11/25/2021 for Pyelonephritis [N12] Troponin I above reference range [R77.8] AKI (acute kidney injury) ([place]) [N17.9] Sepsis ([place]) [A41.9]   Acute kidney injury on chronic kidney disease stage IIIB: baseline creatinine of 1.7, GFR of 40 04/01/21. Acute kidney injury secondary to pyelonephritis and tubulitis. Chronic kidney disease secondary to Polycystic kidney disease, hypertension, vascular disease and acute kidney injuries with limited recovery. No IV contrast exposure.  - holding furosemide -Renal function continues to improve -Adequate urine output recorded -Will schedule hospital follow-up with our office after discharge  Lab Results  Component Value Date   CREATININE 2.36 (H) 11/29/2021   CREATININE 2.44 (H) 11/28/2021   CREATININE 2.68 (H) 11/27/2021    Intake/Output Summary (Last 24 hours) at 11/29/2021 1303 Last data filed at 11/29/2021 0500 Gross per 24 hour  Intake 180 ml  Output 1000 ml  Net -820 ml    Pyelonephritis with sepsis: on ceftriaxone. Urine culture from 2/6 shows citrobacter koseri with pansensitivity. No stones identified on imaging.     Hyponatremia: secondary to acute kidney injury. Improving with improved PO intake and IV fluids.   Sodium 134   Hypertension: current regimen of amlodipine and sinemet.      LOS: [place] 2/13/20231:03 PM

## 2021-11-30 LAB — CULTURE, BLOOD (ROUTINE X 2)
Culture: NO GROWTH
Culture: NO GROWTH
Special Requests: ADEQUATE
Special Requests: ADEQUATE

## 2021-12-01 ENCOUNTER — Ambulatory Visit: Payer: Medicare Other | Admitting: Urology

## 2021-12-06 NOTE — Progress Notes (Unsigned)
12/07/2021 1:40 PM   Jonathon ARCHULETA October 15, 1936 [id number]  Referring provider: Rusty Aus, MD [place] [facility] [place],  [place] [address]  Chief Complaint  Patient presents with   Urinary Retention   Follow-up   Urologic history: 1.  Polycystic kidney disease -MRI 03/2021 - Polycystic kidney disease with innumerable hepatic cyst and renal cysts  2. Bosniak 3F left renal cyst -MRI 03/2021 - Lesion of concern in lower pole of the LEFT kidney has hemorrhagic rim with no peripheral or internal enhancement. Findings most consistent with a complex hemorrhagic cyst. As lesion is new and moderately complex with categorized as Bosniak 3F . Recommend follow-up MRI 6 in 12 months  3. BPH with retention -Managed finasteride 5 mg daily  HPI: Jonathon Welch is a 86 y.o. male who presents today after suprapubic tube was removed with his son, Jonathon Welch.  History is given by son, Jonathon Welch.  He states that his father has been voiding well.  He has had no urinary complaints.  Patient denies any modifying or aggravating factors.  Patient denies any gross hematuria, dysuria or suprapubic/flank pain.  Patient denies any fevers, chills, nausea or vomiting.    PVR 269 mL -likely not accurate as patient does have a large renal cyst which invades the pelvic region  PVR ~ 80 cc on 11/2021 CT  PMH: Past Medical History:  Diagnosis Date   Anemia    vitamin b12 deficiency   Aortic valve disease    Atrial fibrillation (HCC)    Cancer (HCC)    basal and squamous cell skin cancer   DDD (degenerative disc disease), lumbar 07/25/2016   Depression    Dysrhythmia    atrial fibrillation   Herniation through surgical site    Hyperlipidemia    Hypertension    Left inguinal hernia 2019   Migraine headache    less frequent lately   Parkinson disease ([place])    Polycystic kidney disease    Right inguinal hernia    Tremor    Ventral hernia 2019     Surgical History: Past Surgical History:  Procedure Laterality Date   AORTIC AND MITRAL VALVE REPLACEMENT N/A    APPENDECTOMY     ASCENDING AORTIC ANEURYSM REPAIR W/ MECHANICAL AORTIC VALVE REPLACEMENT  1985   aneurysm repaired when replaced "blown out" tissue valve   CARDIAC VALVE REPLACEMENT  1980 and 1985   aortic valve replaced, mechanical valve   CORONARY ARTERY BYPASS GRAFT  1980 and 1985   aortic valve replaced not CABG   IR CATHETER TUBE CHANGE  05/14/2021   MOHS SURGERY     nose and ear   ROBOTIC ASSISTED LAPAROSCOPIC VENTRAL/INCISIONAL HERNIA REPAIR Right 04/06/2018   Procedure: ROBOTIC ASSISTED LAPAROSCOPIC VENTRAL/INCISIONAL HERNIA REPAIR AND RIGHT INGUINAL HERNIA;  Surgeon: Jules Husbands, MD;  Location: [facility] ORS;  Service: General;  Laterality: Right;    Home Medications:  Allergies as of 12/07/2021   No Active Allergies      Medication List        Accurate as of December 07, 2021 11:59 PM. If you have any questions, ask your nurse or doctor.          acetaminophen 500 MG tablet Commonly known as: TYLENOL Take 500 mg by mouth daily as needed for moderate pain or headache.   amLODipine 2.5 MG tablet Commonly known as: NORVASC Take by mouth.   apixaban 2.5 MG Tabs tablet Commonly known as: ELIQUIS Take  2.5 mg by mouth 2 (two) times daily.   carbidopa-levodopa 50-200 MG tablet Commonly known as: SINEMET CR Take 1 tablet by mouth at bedtime.   carbidopa-levodopa 25-100 MG tablet Commonly known as: SINEMET IR Take 2 tablets by mouth 4 (four) times daily.   CRANBERRY PO Take by mouth.   [employer] PROSTATE MAX + PO Take by mouth. beta-sitos/D3/minerals/cranber (PROSTATE MAX PLUS ORAL)   ferrous gluconate 324 MG tablet Commonly known as: FERGON Take 324 mg by mouth daily with breakfast.   finasteride 5 MG tablet Commonly known as: PROSCAR Take 1 tablet (5 mg total) by mouth daily.   pantoprazole 40 MG tablet Commonly known as: PROTONIX Take 1  tablet (40 mg total) by mouth daily.   selegiline 5 MG capsule Commonly known as: ELDEPRYL Take 5 mg by mouth 2 (two) times daily.   sulfamethoxazole-trimethoprim 800-160 MG tablet Commonly known as: BACTRIM DS Take 1 tablet by mouth 2 (two) times daily for 10 days.        Allergies:  No Active Allergies   Family History: Family History  Problem Relation Age of Onset   Heart disease Mother    Hypertension Mother    Stroke Father    Kidney disease Sister     Social History:  reports that he quit smoking about 52 years ago. His smoking use included cigarettes. He has a 22.00 pack-year smoking history. He has never used smokeless tobacco. He reports that he does not drink alcohol and does not use drugs.  ROS: Pertinent ROS in HPI  Physical Exam: Blood pressure (!) 155/84, pulse 68, height 5\' 9"  (1.753 m), weight 151 lb (68.5 kg). .  Constitutional:  Well nourished. Alert and oriented, No acute distress. HEENT: [place] AT, mask in place.  Trachea midline Cardiovascular: No clubbing, cyanosis, or edema. Respiratory: Normal respiratory effort, no increased work of breathing. GU: No CVA tenderness.  No bladder fullness or masses.   Neurologic: Grossly intact, no focal deficits, moving all 4 extremities. Psychiatric: Normal mood and affect.   Laboratory Data: Lab Results  Component Value Date   WBC 17.5 (H) 11/29/2021   HGB 13.7 11/29/2021   HCT 43.2 11/29/2021   MCV 91.5 11/29/2021   PLT 341 11/29/2021    Lab Results  Component Value Date   CREATININE 2.36 (H) 11/29/2021    Lab Results  Component Value Date   AST 10 (L) 11/26/2021   Lab Results  Component Value Date   ALT <5 11/26/2021    Urinalysis    Component Value Date/Time   COLORURINE YELLOW (A) 11/25/2021 1227   APPEARANCEUR TURBID (A) 11/25/2021 1227   APPEARANCEUR Cloudy (A) 03/01/2021 1526   LABSPEC 1.014 11/25/2021 1227   PHURINE 5.0 11/25/2021 1227   GLUCOSEU NEGATIVE 11/25/2021 1227   HGBUR  SMALL (A) 11/25/2021 1227   BILIRUBINUR NEGATIVE 11/25/2021 1227   BILIRUBINUR Negative 03/01/2021 [place] 11/25/2021 1227   PROTEINUR 100 (A) 11/25/2021 1227   NITRITE NEGATIVE 11/25/2021 1227   LEUKOCYTESUR MODERATE (A) 11/25/2021 1227  I have reviewed the labs.   Pertinent Imaging:  12/07/21 13:30  Scan Result 259mL     Assessment & Plan:    1. Urinary retention -PVR < 300 cc -No indication for catheter placement at this time  2. BPH with retention -continue finasteride 5 mg daily  3. Bosniak 65f left renal cyst -Follow-up MRI in June 2023 ***  Return in about 6 months (around 06/06/2022) for  PVR .  These notes generated with voice recognition software. I apologize for typographical errors.  Zara Council, PA-C  [facility] [address]  [place], [place] [address] [phone]

## 2021-12-07 ENCOUNTER — Ambulatory Visit: Payer: Medicare Other | Admitting: Urology

## 2021-12-07 ENCOUNTER — Other Ambulatory Visit: Payer: Self-pay

## 2021-12-07 VITALS — BP 155/84 | HR 68 | Ht 69.0 in | Wt 151.0 lb

## 2021-12-07 DIAGNOSIS — N2889 Other specified disorders of kidney and ureter: Secondary | ICD-10-CM | POA: Diagnosis not present

## 2021-12-07 DIAGNOSIS — N401 Enlarged prostate with lower urinary tract symptoms: Secondary | ICD-10-CM | POA: Diagnosis not present

## 2021-12-07 DIAGNOSIS — N138 Other obstructive and reflux uropathy: Secondary | ICD-10-CM | POA: Diagnosis not present

## 2021-12-07 DIAGNOSIS — R339 Retention of urine, unspecified: Secondary | ICD-10-CM

## 2021-12-07 LAB — BLADDER SCAN AMB NON-IMAGING

## 2021-12-19 ENCOUNTER — Encounter: Payer: Self-pay | Admitting: Urology

## 2022-01-17 ENCOUNTER — Other Ambulatory Visit: Payer: Self-pay

## 2022-01-17 ENCOUNTER — Emergency Department: Payer: Medicare Other

## 2022-01-17 ENCOUNTER — Observation Stay
Admission: EM | Admit: 2022-01-17 | Discharge: 2022-02-14 | Disposition: E | Payer: Medicare Other | Attending: Internal Medicine | Admitting: Internal Medicine

## 2022-01-17 DIAGNOSIS — E876 Hypokalemia: Secondary | ICD-10-CM | POA: Diagnosis not present

## 2022-01-17 DIAGNOSIS — Z85828 Personal history of other malignant neoplasm of skin: Secondary | ICD-10-CM | POA: Diagnosis not present

## 2022-01-17 DIAGNOSIS — F0390 Unspecified dementia without behavioral disturbance: Secondary | ICD-10-CM | POA: Diagnosis not present

## 2022-01-17 DIAGNOSIS — R531 Weakness: Secondary | ICD-10-CM

## 2022-01-17 DIAGNOSIS — I469 Cardiac arrest, cause unspecified: Secondary | ICD-10-CM | POA: Diagnosis not present

## 2022-01-17 DIAGNOSIS — I5022 Chronic systolic (congestive) heart failure: Secondary | ICD-10-CM | POA: Insufficient documentation

## 2022-01-17 DIAGNOSIS — Z79899 Other long term (current) drug therapy: Secondary | ICD-10-CM | POA: Diagnosis not present

## 2022-01-17 DIAGNOSIS — R4182 Altered mental status, unspecified: Secondary | ICD-10-CM | POA: Insufficient documentation

## 2022-01-17 DIAGNOSIS — Z951 Presence of aortocoronary bypass graft: Secondary | ICD-10-CM | POA: Insufficient documentation

## 2022-01-17 DIAGNOSIS — R296 Repeated falls: Secondary | ICD-10-CM | POA: Diagnosis not present

## 2022-01-17 DIAGNOSIS — G20 Parkinson's disease: Secondary | ICD-10-CM | POA: Insufficient documentation

## 2022-01-17 DIAGNOSIS — I10 Essential (primary) hypertension: Secondary | ICD-10-CM | POA: Diagnosis present

## 2022-01-17 DIAGNOSIS — I110 Hypertensive heart disease with heart failure: Secondary | ICD-10-CM | POA: Insufficient documentation

## 2022-01-17 DIAGNOSIS — Q613 Polycystic kidney, unspecified: Secondary | ICD-10-CM

## 2022-01-17 DIAGNOSIS — I482 Chronic atrial fibrillation, unspecified: Secondary | ICD-10-CM | POA: Diagnosis not present

## 2022-01-17 DIAGNOSIS — Z87891 Personal history of nicotine dependence: Secondary | ICD-10-CM | POA: Diagnosis not present

## 2022-01-17 DIAGNOSIS — G20A1 Parkinson's disease without dyskinesia, without mention of fluctuations: Secondary | ICD-10-CM | POA: Diagnosis present

## 2022-01-17 DIAGNOSIS — Z20822 Contact with and (suspected) exposure to covid-19: Secondary | ICD-10-CM | POA: Diagnosis not present

## 2022-01-17 LAB — URINALYSIS, ROUTINE W REFLEX MICROSCOPIC
Bacteria, UA: NONE SEEN
Bilirubin Urine: NEGATIVE
Glucose, UA: NEGATIVE mg/dL
Hgb urine dipstick: NEGATIVE
Ketones, ur: 5 mg/dL — AB
Leukocytes,Ua: NEGATIVE
Nitrite: NEGATIVE
Protein, ur: 100 mg/dL — AB
Specific Gravity, Urine: 1.013 (ref 1.005–1.030)
pH: 5 (ref 5.0–8.0)

## 2022-01-17 LAB — COMPREHENSIVE METABOLIC PANEL
ALT: <5 U/L (ref 0–44)
AST: 8 U/L — ABNORMAL LOW (ref 15–41)
Albumin: 2.6 g/dL — ABNORMAL LOW (ref 3.5–5.0)
Alkaline Phosphatase: 115 U/L (ref 38–126)
Anion gap: 15 (ref 5–15)
BUN: 35 mg/dL — ABNORMAL HIGH (ref 8–23)
CO2: 26 mmol/L (ref 22–32)
Calcium: 8.6 mg/dL — ABNORMAL LOW (ref 8.9–10.3)
Chloride: 96 mmol/L — ABNORMAL LOW (ref 98–111)
Creatinine, Ser: 1.96 mg/dL — ABNORMAL HIGH (ref 0.61–1.24)
GFR, Estimated: 33 mL/min — ABNORMAL LOW (ref >60)
Glucose, Bld: 130 mg/dL — ABNORMAL HIGH (ref 70–99)
Potassium: 2.9 mmol/L — ABNORMAL LOW (ref 3.5–5.1)
Sodium: 137 mmol/L (ref 135–145)
Total Bilirubin: 1.3 mg/dL — ABNORMAL HIGH (ref 0.3–1.2)
Total Protein: 6.4 g/dL — ABNORMAL LOW (ref 6.5–8.1)

## 2022-01-17 LAB — CBC WITH DIFFERENTIAL/PLATELET
Abs Immature Granulocytes: 0.06 10*3/uL (ref 0.00–0.07)
Basophils Absolute: 0.1 10*3/uL (ref 0.0–0.1)
Basophils Relative: 0 %
Eosinophils Absolute: 0 10*3/uL (ref 0.0–0.5)
Eosinophils Relative: 0 %
HCT: 35.7 % — ABNORMAL LOW (ref 39.0–52.0)
Hemoglobin: 11.2 g/dL — ABNORMAL LOW (ref 13.0–17.0)
Immature Granulocytes: 0 %
Lymphocytes Relative: 4 %
Lymphs Abs: 0.6 10*3/uL — ABNORMAL LOW (ref 0.7–4.0)
MCH: 27.5 pg (ref 26.0–34.0)
MCHC: 31.4 g/dL (ref 30.0–36.0)
MCV: 87.7 fL (ref 80.0–100.0)
Monocytes Absolute: 0.7 10*3/uL (ref 0.1–1.0)
Monocytes Relative: 5 %
Neutro Abs: 13.2 10*3/uL — ABNORMAL HIGH (ref 1.7–7.7)
Neutrophils Relative %: 91 %
Platelets: 234 10*3/uL (ref 150–400)
RBC: 4.07 MIL/uL — ABNORMAL LOW (ref 4.22–5.81)
RDW: 16.5 % — ABNORMAL HIGH (ref 11.5–15.5)
WBC: 14.7 10*3/uL — ABNORMAL HIGH (ref 4.0–10.5)
nRBC: 0 % (ref 0.0–0.2)

## 2022-01-17 LAB — LIPASE, BLOOD: Lipase: 27 U/L (ref 11–51)

## 2022-01-17 LAB — RESP PANEL BY RT-PCR (FLU A&B, COVID) ARPGX2
Influenza A by PCR: NEGATIVE
Influenza B by PCR: NEGATIVE
SARS Coronavirus 2 by RT PCR: NEGATIVE

## 2022-01-17 LAB — TROPONIN I (HIGH SENSITIVITY)
Troponin I (High Sensitivity): 68 ng/L — ABNORMAL HIGH (ref <18)
Troponin I (High Sensitivity): 63 ng/L — ABNORMAL HIGH (ref <18)

## 2022-01-17 LAB — MAGNESIUM: Magnesium: 1.9 mg/dL (ref 1.7–2.4)

## 2022-01-17 LAB — CK: Total CK: 14 U/L — ABNORMAL LOW (ref 49–397)

## 2022-01-17 MED ORDER — SODIUM CHLORIDE 0.9 % IV SOLN: 250.0000 mL | INTRAVENOUS | Status: DC | PRN

## 2022-01-17 MED ORDER — SODIUM CHLORIDE 0.9% FLUSH: 3.0000 mL | INTRAVENOUS | Status: DC | PRN

## 2022-01-17 MED ORDER — POTASSIUM CHLORIDE CRYS ER 20 MEQ PO TBCR: 40.0000 meq | EXTENDED_RELEASE_TABLET | Freq: Once | ORAL | Status: DC

## 2022-01-17 MED ORDER — APIXABAN 2.5 MG PO TABS
2.5000 mg | ORAL_TABLET | Freq: Two times a day (BID) | ORAL | Status: DC
  Administered 2022-01-17 – 2022-01-18 (×2): 2.5 mg via ORAL
  Filled 2022-01-17 (×2): qty 1

## 2022-01-17 MED ORDER — TORSEMIDE 20 MG PO TABS
20.0000 mg | ORAL_TABLET | Freq: Every day | ORAL | Status: DC
  Filled 2022-01-17: qty 1

## 2022-01-17 MED ORDER — ONDANSETRON HCL 4 MG PO TABS: 4.0000 mg | ORAL_TABLET | Freq: Four times a day (QID) | ORAL | Status: DC | PRN

## 2022-01-17 MED ORDER — PANTOPRAZOLE SODIUM 40 MG PO TBEC
40.0000 mg | DELAYED_RELEASE_TABLET | Freq: Every day | ORAL | Status: DC
  Administered 2022-01-18: 40 mg via ORAL
  Filled 2022-01-17: qty 1

## 2022-01-17 MED ORDER — LACTATED RINGERS IV BOLUS
1000.0000 mL | Freq: Once | INTRAVENOUS | Status: AC
  Administered 2022-01-17: 1000 mL via INTRAVENOUS

## 2022-01-17 MED ORDER — FINASTERIDE 5 MG PO TABS
5.0000 mg | ORAL_TABLET | Freq: Every day | ORAL | Status: DC
  Administered 2022-01-18: 5 mg via ORAL
  Filled 2022-01-17: qty 1

## 2022-01-17 MED ORDER — ONDANSETRON HCL 4 MG/2ML IJ SOLN
4.0000 mg | Freq: Four times a day (QID) | INTRAMUSCULAR | Status: DC | PRN
Start: 2022-01-17 — End: 2022-01-18

## 2022-01-17 MED ORDER — FERROUS GLUCONATE 324 (38 FE) MG PO TABS
324.0000 mg | ORAL_TABLET | Freq: Every day | ORAL | Status: DC
Start: 2022-01-18 — End: 2022-01-18
  Filled 2022-01-17: qty 1

## 2022-01-17 MED ORDER — SELEGILINE HCL 5 MG PO TABS
5.0000 mg | ORAL_TABLET | Freq: Two times a day (BID) | ORAL | Status: DC
  Administered 2022-01-18: 5 mg via ORAL
  Filled 2022-01-17 (×2): qty 1

## 2022-01-17 MED ORDER — SODIUM CHLORIDE 0.9% FLUSH
3.0000 mL | Freq: Two times a day (BID) | INTRAVENOUS | Status: DC
  Administered 2022-01-17 – 2022-01-18 (×3): 3 mL via INTRAVENOUS

## 2022-01-17 MED ORDER — AMLODIPINE BESYLATE 5 MG PO TABS: 2.5000 mg | ORAL_TABLET | Freq: Every day | ORAL | Status: DC

## 2022-01-17 MED ORDER — ACETAMINOPHEN 500 MG PO TABS: 500.0000 mg | ORAL_TABLET | Freq: Every day | ORAL | Status: DC | PRN

## 2022-01-17 MED ORDER — POTASSIUM CHLORIDE CRYS ER 20 MEQ PO TBCR
40.0000 meq | EXTENDED_RELEASE_TABLET | Freq: Once | ORAL | Status: AC
  Administered 2022-01-17: 40 meq via ORAL
  Filled 2022-01-17: qty 2

## 2022-01-17 MED ORDER — CARBIDOPA-LEVODOPA 25-100 MG PO TABS
2.0000 | ORAL_TABLET | Freq: Four times a day (QID) | ORAL | Status: DC
  Administered 2022-01-17 – 2022-01-18 (×3): 2 via ORAL
  Filled 2022-01-17 (×5): qty 2

## 2022-01-17 NOTE — ED Triage Notes (Addendum)
Per pt son, pt has parkinson dementia, pt has increased weakness and is not able to ambulate to go to the bathroom over the past few days, sleeping more, having increased confusion, denies having a fall last night. Pt is on eliquis. States they have an application into to [employer] for placement ?

## 2022-01-17 NOTE — Assessment & Plan Note (Signed)
Patient has a history of polycystic kidney disease with stage IV chronic kidney disease ?Renal function appears to be at baseline ?Monitor closely during this hospitalization ?

## 2022-01-17 NOTE — Assessment & Plan Note (Signed)
Rate controlled ?Continue Eliquis as secondary prophylaxis for an acute stroke ?

## 2022-01-17 NOTE — ED Provider Notes (Signed)
? ?[facility] ?Provider Note ? ? ? Event Date/Time  ? First MD Initiated Contact with Patient 02/02/2022 1034   ?  (approximate) ? ? ?History  ? ?Chief Complaint ?Weakness ? ? ?HPI ? ?Jonathon Welch is a 86 y.o. male with past medical history of hypertension, hyperlipidemia, Parkinson disease, dementia, chronic atrial fibrillation on Eliquis, CHF, and CKD who presents to the ED for weakness and altered mental status.  History is limited due to patient's baseline dementia and altered mental status and majority of history is obtained from son at bedside.  He states that the patient is typically lucid but confused at his baseline, however the over the past 2 to 3 days has been much more weak and fatigued than usual.  Son states that patient has slept for 22 hours a day for the past 2 days and has been too weak to walk.  He has had a slight cough but no fevers or shortness of breath.  Patient currently states that he feels fine and is hungry, denies any chest pain, abdominal pain, nausea, vomiting, or dysuria.  Son does state that patient presented with similar symptoms when admitted for pyelonephritis 2 months ago.  Son also states that patient attempted to run down the hallway last night, tripped and fell forward onto his hands and knees but did not hit his head or lose consciousness. ?  ? ? ?Physical Exam  ? ?Triage Vital Signs: ?ED Triage Vitals  ?Enc Vitals Group  ?   BP 01/25/2022 1028 (!) 155/107  ?   Pulse Rate 01/30/2022 1028 98  ?   Resp 02/01/2022 1028 17  ?   Temp 01/16/2022 1034 97.6 ?F (36.4 ?C)  ?   Temp Source 02/02/2022 1028 Oral  ?   SpO2 01/23/2022 1028 98 %  ?   Weight 02/04/2022 1031 162 lb (73.5 kg)  ?   Height --   ?   Head Circumference --   ?   Peak Flow --   ?   Pain Score --   ?   Pain Loc --   ?   Pain Edu? --   ?   Excl. in [place]? --   ? ? ?Most recent vital signs: ?Vitals:  ? 01/21/2022 1330 02/08/2022 1400  ?BP: (!) 151/70   ?Pulse: (!) 45 68  ?Resp: 18 20  ?Temp:    ?SpO2: 92% 95%   ? ? ?Constitutional: Somnolent but arousable to voice, oriented to person and place, but not time or situation. ?Eyes: Conjunctivae are normal.  Pupils equal, round, and reactive to light bilaterally. ?Head: Atraumatic. ?Nose: No congestion/rhinnorhea. ?Mouth/Throat: Mucous membranes are moist.  ?Neck: No midline cervical spine tenderness to palpation. ?Cardiovascular: Normal rate, regular rhythm. Grossly normal heart sounds.  2+ radial pulses bilaterally. ?Respiratory: Normal respiratory effort.  No retractions. Lungs CTAB. ?Gastrointestinal: Soft and nontender. No distention. ?Musculoskeletal: No lower extremity tenderness nor edema.  ?Neurologic:  Normal speech and language. No gross focal neurologic deficits are appreciated. ? ? ? ?ED Results / Procedures / Treatments  ? ?Labs ?(all labs ordered are listed, but only abnormal results are displayed) ?Labs Reviewed  ?CBC WITH DIFFERENTIAL/PLATELET - Abnormal; Notable for the following components:  ?    Result Value  ? WBC 14.7 (*)   ? RBC 4.07 (*)   ? Hemoglobin 11.2 (*)   ? HCT 35.7 (*)   ? RDW 16.5 (*)   ? Neutro Abs 13.2 (*)   ? Lymphs  Abs 0.6 (*)   ? All other components within normal limits  ?COMPREHENSIVE METABOLIC PANEL - Abnormal; Notable for the following components:  ? Potassium 2.9 (*)   ? Chloride 96 (*)   ? Glucose, Bld 130 (*)   ? BUN 35 (*)   ? Creatinine, Ser 1.96 (*)   ? Calcium 8.6 (*)   ? Total Protein 6.4 (*)   ? Albumin 2.6 (*)   ? AST 8 (*)   ? Total Bilirubin 1.3 (*)   ? GFR, Estimated 33 (*)   ? All other components within normal limits  ?URINALYSIS, ROUTINE W REFLEX MICROSCOPIC - Abnormal; Notable for the following components:  ? Color, Urine YELLOW (*)   ? APPearance CLEAR (*)   ? Ketones, ur 5 (*)   ? Protein, ur 100 (*)   ? All other components within normal limits  ?TROPONIN I (HIGH SENSITIVITY) - Abnormal; Notable for the following components:  ? Troponin I (High Sensitivity) 68 (*)   ? All other components within normal limits   ?TROPONIN I (HIGH SENSITIVITY) - Abnormal; Notable for the following components:  ? Troponin I (High Sensitivity) 63 (*)   ? All other components within normal limits  ?RESP PANEL BY RT-PCR (FLU A&B, COVID) ARPGX2  ?LIPASE, BLOOD  ?CK  ?MAGNESIUM  ? ? ? ?EKG ? ?ED ECG REPORT ?Tempie Hoist, the attending physician, personally viewed and interpreted this ECG. ? ? Date: 02/02/2022 ? EKG Time: 10:42 ? Rate: 68 ? Rhythm: atrial fibrillation, frequent PVC's noted ? Axis: Normal ? Intervals:none ? ST&T Change: None ? ?RADIOLOGY ?CT head reviewed by me with no hemorrhage or midline shift.  Chest x-ray reviewed by me with small bilateral pleural effusions, no focal infiltrate or edema noted. ? ?PROCEDURES: ? ?Critical Care performed: No ? ?Procedures ? ? ?MEDICATIONS ORDERED IN ED: ?Medications  ?potassium chloride SA (KLOR-CON M) CR tablet 40 mEq (has no administration in time range)  ?lactated ringers bolus 1,000 mL (1,000 mLs Intravenous New Bag/Given 01/28/2022 1248)  ? ? ? ?IMPRESSION / MDM / ASSESSMENT AND PLAN / ED COURSE  ?I reviewed the triage vital signs and the nursing notes. ?             ?               ? ?86 y.o. male with past medical history of hypertension, chronic atrial fibrillation on Eliquis, hyperlipidemia, CHF, CKD, Parkinson disease, and dementia who presents to the ED for increased generalized weakness and altered mental status over the past 2 to 3 days. ? ?Differential diagnosis includes, but is not limited to, stroke, dehydration, electrolyte abnormality, pneumonia, UTI, traumatic head injury, AKI. ? ?Patient is somnolent but easily arousable to voice with no focal neurologic deficits.  Given fall and change in mental status, we will check CT head, also check chest x-ray for evidence of infectious process along with UA.  Labs and UA are pending, we will hydrate with IV fluids. ? ?CT head is negative for acute process, chest x-ray is unremarkable and shows chronic small bilateral pleural effusions  with no evidence of pneumonia.  UA shows no signs of infection, BMP shows stable chronic kidney disease without acute electrolyte abnormality other than mild hypokalemia.  Troponin is mildly elevated but suspect this is due to patient's chronic kidney disease, troponin stable on recheck.  CBC with mild leukocytosis that appears chronic, no evidence of acute infection.  Patient's change in mental status may be medication  effect from recent start of rivastigmine, would also consider worsening Parkinson disease and dementia.  Patient is unable to ambulate on his own at this time and case discussed with hospitalist for admission. ? ?  ? ? ?FINAL CLINICAL IMPRESSION(S) / ED DIAGNOSES  ? ?Final diagnoses:  ?Altered mental status, unspecified altered mental status type  ?Multiple falls  ? ? ? ?Rx / DC Orders  ? ?ED Discharge Orders   ? ? None  ? ?  ? ? ? ?Note:  This document was prepared using Dragon voice recognition software and may include unintentional dictation errors. ?  ?Blake Divine, MD ?01/21/2022 1445 ? ?

## 2022-01-17 NOTE — Assessment & Plan Note (Signed)
Blood pressure is stable Continue amlodipine 

## 2022-01-17 NOTE — Assessment & Plan Note (Signed)
Secondary to diuretic use Supplement potassium Check magnesium levels 

## 2022-01-17 NOTE — Assessment & Plan Note (Signed)
Patient with a history of Parkinson's disease and dementia who presents to the ER for evaluation of worsening weakness and frequent falls at home. ?Place patient on fall precautions ?PT evaluation ?

## 2022-01-17 NOTE — Assessment & Plan Note (Signed)
Continue Sinemet and selegiline ?

## 2022-01-17 NOTE — Assessment & Plan Note (Signed)
Patient with a history of Parkinson's disease, dementia who presents for evaluation of frequent falls related to weakness ?Place patient on fall precautions ?PT evaluation ?

## 2022-01-17 NOTE — ED Notes (Signed)
X2 unsuccessful attempts for peripheral IV by this RN, x2 unsuccessful attempts for peripheral iv by Enriqueta Shutter. ?

## 2022-01-17 NOTE — H&P (Signed)
?History and Physical  ? ? ?Patient: Jonathon Welch QMV:[id number] DOB: Jun 23, 1936 ?DOA: 01/21/2022 ?DOS: the patient was seen and examined on 02/06/2022 ?PCP: Rusty Aus, MD  ?Patient coming from: Home ? ?Chief Complaint:  ?Chief Complaint  ?Patient presents with  ? Weakness  ? ?HPI: Jonathon Welch is a 86 y.o. male with medical history significant for atrial fibrillation on chronic anticoagulation therapy, Parkinson's disease, polycystic kidney disease, chronic systolic heart failure with last known LVEF of 30 to 35%, depression, dementia who was brought into the ER by his family for evaluation of progressively worsening weakness, frequent falls and increased confusion. ?Patient's wife states that she has noted a significant decline in the last 1 week.  Patient used to be able to ambulate to the bathroom but over the last couple of days he has been bedbound and very weak and unable to get around.  He lives with his wife who is very frail and who has difficulty getting him up when he falls. ?She initially noted the patient placed in an assisted facility but with his worsening confusion, increased weakness and falls he most likely needs a higher level of care. ?He has been sleeping longer than normal according to his family and so they called his primary care provider who advised them to bring him to the ER for further evaluation. ?I am unable to do review of systems on this patient due to his underlying dementia ?Review of Systems: unable to review all systems due to the inability of the patient to answer questions. ?Past Medical History:  ?Diagnosis Date  ? Anemia   ? vitamin b12 deficiency  ? Aortic valve disease   ? Atrial fibrillation ([place])   ? Cancer [facility])   ? basal and squamous cell skin cancer  ? DDD (degenerative disc disease), lumbar 07/25/2016  ? Depression   ? Dysrhythmia   ? atrial fibrillation  ? Herniation through surgical site   ? Hyperlipidemia   ? Hypertension   ? Left inguinal hernia 2019  ?  Migraine headache   ? less frequent lately  ? Parkinson disease ([place])   ? Polycystic kidney disease   ? Right inguinal hernia   ? Tremor   ? Ventral hernia 2019  ? ?Past Surgical History:  ?Procedure Laterality Date  ? AORTIC AND MITRAL VALVE REPLACEMENT N/A   ? APPENDECTOMY    ? ASCENDING AORTIC ANEURYSM REPAIR W/ MECHANICAL AORTIC VALVE REPLACEMENT  1985  ? aneurysm repaired when replaced "blown out" tissue valve  ? [place] and 1985  ? aortic valve replaced, mechanical valve  ? CORONARY ARTERY BYPASS GRAFT  1980 and 1985  ? aortic valve replaced not CABG  ? IR CATHETER TUBE CHANGE  05/14/2021  ? MOHS SURGERY    ? nose and ear  ? ROBOTIC ASSISTED LAPAROSCOPIC VENTRAL/INCISIONAL HERNIA REPAIR Right 04/06/2018  ? Procedure: ROBOTIC ASSISTED LAPAROSCOPIC VENTRAL/INCISIONAL HERNIA REPAIR AND RIGHT INGUINAL HERNIA;  Surgeon: Jules Husbands, MD;  Location: [facility] ORS;  Service: General;  Laterality: Right;  ? ?Social History:  reports that he quit smoking about 52 years ago. His smoking use included cigarettes. He has a 22.00 pack-year smoking history. He has never used smokeless tobacco. He reports that he does not drink alcohol and does not use drugs. ? ?No Known Allergies ? ?Family History  ?Problem Relation Age of Onset  ? Heart disease Mother   ? Hypertension Mother   ? Stroke Father   ? Kidney disease  Sister   ? ? ?Prior to Admission medications   ?Medication Sig Start Date End Date Taking? Authorizing Provider  ?acetaminophen (TYLENOL) 500 MG tablet Take 500 mg by mouth daily as needed for moderate pain or headache.    [provider]  ?amLODipine (NORVASC) 2.5 MG tablet Take by mouth. 08/24/20   [provider]  ?apixaban (ELIQUIS) 2.5 MG TABS tablet Take 2.5 mg by mouth 2 (two) times daily.    [provider]  ?carbidopa-levodopa (SINEMET CR) 50-200 MG tablet Take 1 tablet by mouth at bedtime. ?Patient not taking: Reported on 11/25/2021    [provider]   ?carbidopa-levodopa (SINEMET IR) 25-100 MG tablet Take 2 tablets by mouth 4 (four) times daily. 01/12/16   [provider]  ?CRANBERRY PO Take by mouth.    [provider]  ?ferrous gluconate (FERGON) 324 MG tablet Take 324 mg by mouth daily with breakfast.    [provider]  ?finasteride (PROSCAR) 5 MG tablet Take 1 tablet (5 mg total) by mouth daily. 03/19/21   Stoioff, Ronda Fairly, MD  ?Misc Natural Products ([employer] PROSTATE MAX + PO) Take by mouth. beta-sitos/D3/minerals/cranber (PROSTATE MAX PLUS ORAL)    [provider]  ?pantoprazole (PROTONIX) 40 MG tablet Take 1 tablet (40 mg total) by mouth daily. 11/29/21   Danford, Suann Larry, MD  ?rivastigmine (EXELON) 1.5 MG capsule Take 1.5 mg by mouth 2 (two) times daily. 01/10/22   [provider]  ?selegiline (ELDEPRYL) 5 MG capsule Take 5 mg by mouth 2 (two) times daily.  06/07/16   [provider]  ?torsemide (DEMADEX) 20 MG tablet Take 20 mg by mouth daily. 01/14/22   [provider]  ? ? ?Physical Exam: ?Vitals:  ? 01/16/2022 1330 01/19/2022 1400 01/26/2022 1430 02/05/2022 1500  ?BP: (!) 151/70 (!) 153/104 (!) 147/72 (!) 158/66  ?Pulse: (!) 45 68 70 90  ?Resp: '18 20 16 15  '$ ?Temp:      ?TempSrc:      ?SpO2: 92% 95% 97% 96%  ?Weight:      ? ?Physical Exam ?Vitals and nursing note reviewed.  ?Constitutional:   ?   Comments: Oriented to person and place but not to time  ?HENT:  ?   Head: Normocephalic.  ?   Comments: Swelling and ecchymosis involving the right jaw from recent dental surgery ?   Nose: Nose normal.  ?   Mouth/Throat:  ?   Mouth: Mucous membranes are moist.  ?Eyes:  ?   Pupils: Pupils are equal, round, and reactive to light.  ?Cardiovascular:  ?   Rate and Rhythm: Normal rate and regular rhythm.  ?Abdominal:  ?   General: Bowel sounds are normal.  ?   Palpations: Abdomen is soft.  ?   Hernia: A hernia is present.  ?Musculoskeletal:  ?   Cervical back: Normal range of motion and neck supple.  ?   Right  lower leg: Edema present.  ?   Left lower leg: Edema present.  ?Skin: ?   General: Skin is warm and dry.  ?Neurological:  ?   Mental Status: He is alert.  ?   Motor: Weakness present.  ?Psychiatric:     ?   Mood and Affect: Mood normal.     ?   Behavior: Behavior normal.  ? ? ?Data Reviewed: ?Data Reviewed: ?Relevant notes from primary care and specialist visits, past discharge summaries as available in EHR, including Care Everywhere. ?Prior diagnostic testing as pertinent to  current admission diagnoses ?Updated medications and problem lists for reconciliation ?ED course, including vitals, labs, imaging, treatment and response to treatment ?Triage notes, nursing and pharmacy notes and ED provider's notes ?Notable results as noted in HPI ?Labs reviewed and show troponin 68 >> 63, sodium 137, potassium 2.9, chloride 96, bicarb 26, glucose 130, BUN 35, creatinine 1.96, calcium 8.6, total protein 6.4, albumin 2.6, white count 14.7, hemoglobin 11.2, hematocrit 35.7, RDW 16.5, platelet count 234 ?Urinalysis is sterile ?Chest x-ray reviewed by me shows relatively unchanged small bilateral pleural effusions and ?bibasilar atelectasis. ?CT scan of the head without contrast shows No acute intracranial abnormality seen. ?Twelve-lead EKG reviewed by me shows A-fib with PVCs and LVH ?There are no new results to review at this time. ? ?Assessment and Plan: ?* Weakness ?Patient with a history of Parkinson's disease and dementia who presents to the ER for evaluation of worsening weakness and frequent falls at home. ?Place patient on fall precautions ?PT evaluation ? ?Frequent falls ?Patient with a history of Parkinson's disease, dementia who presents for evaluation of frequent falls related to weakness ?Place patient on fall precautions ?PT evaluation ? ?Hypokalemia ?Secondary to diuretic use ?Supplement potassium ?Check magnesium levels ? ?Polycystic kidney disease ?Patient has a history of polycystic kidney disease with stage IV  chronic kidney disease ?Renal function appears to be at baseline ?Monitor closely during this hospitalization ? ?Hypertension ?Blood pressure is stable ?Continue amlodipine ? ?Parkinson's disease ([place]) ?Continu

## 2022-01-17 NOTE — ED Notes (Signed)
Patient transported to CT 

## 2022-01-18 ENCOUNTER — Observation Stay: Payer: Medicare Other

## 2022-01-18 DIAGNOSIS — R531 Weakness: Secondary | ICD-10-CM | POA: Diagnosis not present

## 2022-01-18 LAB — BASIC METABOLIC PANEL
Anion gap: 9 (ref 5–15)
BUN: 38 mg/dL — ABNORMAL HIGH (ref 8–23)
CO2: 29 mmol/L (ref 22–32)
Calcium: 8.3 mg/dL — ABNORMAL LOW (ref 8.9–10.3)
Chloride: 100 mmol/L (ref 98–111)
Creatinine, Ser: 1.85 mg/dL — ABNORMAL HIGH (ref 0.61–1.24)
GFR, Estimated: 36 mL/min — ABNORMAL LOW (ref >60)
Glucose, Bld: 141 mg/dL — ABNORMAL HIGH (ref 70–99)
Potassium: 3.6 mmol/L (ref 3.5–5.1)
Sodium: 138 mmol/L (ref 135–145)

## 2022-01-18 LAB — CBC
HCT: 35.3 % — ABNORMAL LOW (ref 39.0–52.0)
Hemoglobin: 11.1 g/dL — ABNORMAL LOW (ref 13.0–17.0)
MCH: 27.3 pg (ref 26.0–34.0)
MCHC: 31.4 g/dL (ref 30.0–36.0)
MCV: 86.7 fL (ref 80.0–100.0)
Platelets: 244 10*3/uL (ref 150–400)
RBC: 4.07 MIL/uL — ABNORMAL LOW (ref 4.22–5.81)
RDW: 16.5 % — ABNORMAL HIGH (ref 11.5–15.5)
WBC: 14.7 10*3/uL — ABNORMAL HIGH (ref 4.0–10.5)
nRBC: 0 % (ref 0.0–0.2)

## 2022-01-18 MED ORDER — ALUM & MAG HYDROXIDE-SIMETH 200-200-20 MG/5ML PO SUSP
30.0000 mL | Freq: Once | ORAL | Status: AC
Start: 2022-01-18 — End: 2022-01-18
  Administered 2022-01-18: 30 mL via ORAL
  Filled 2022-01-18: qty 30

## 2022-01-18 MED ORDER — LIDOCAINE VISCOUS HCL 2 % MT SOLN
15.0000 mL | Freq: Once | OROMUCOSAL | Status: AC
Start: 2022-01-18 — End: 2022-01-18
  Administered 2022-01-18: 15 mL via ORAL
  Filled 2022-01-18 (×2): qty 15

## 2022-01-18 MED ORDER — MORPHINE SULFATE (PF) 2 MG/ML IV SOLN
1.0000 mg | Freq: Once | INTRAVENOUS | Status: AC
  Administered 2022-01-18: 1 mg via INTRAVENOUS
  Filled 2022-01-18: qty 1

## 2022-02-14 NOTE — Progress Notes (Signed)
Received call from CCMD at approximately 11:45am, patient HR in the 40's. Responded to bedside, patient appeared not to be breathing, called rapid response.  Patient pronounced dead at 11:50am. Son at bedside.   ?

## 2022-02-14 NOTE — Progress Notes (Signed)
?   28-Jan-2022 1235  ?Clinical Encounter Type  ?Visited With Patient and family together  ?Visit Type Initial;Death;Spiritual support  ?Referral From Nurse  ?Consult/Referral To Chaplain  ?Spiritual Encounters  ?Spiritual Needs Grief support;Prayer  ? ?Daryel November offered compassionate presence and grief support to the spouse and son of Mr. Youtz. Chaplain Burris invited sharing of both the events of the day (the death had been unexpected) and of memories of Mr. Menz. ? ?Daryel November offered prayer at the family's request. Daryel November also encouraged taking time to be present together and to allow Mr. and Mrs. Telleria to have some time together alone. They were married 33 years. ? ?Chaplain B also worked with their son to collect his contact information for making arrangements later on. ?

## 2022-02-14 NOTE — Hospital Course (Signed)
Taken from H&P. ? ? Jonathon Welch is a 86 y.o. male with medical history significant for atrial fibrillation on chronic anticoagulation therapy, Parkinson's disease, polycystic kidney disease, chronic systolic heart failure with last known LVEF of 30 to 35%, depression, dementia who was brought into the ER by his family for evaluation of progressively worsening weakness, frequent falls and increased confusion. ?Patient's wife states that she has noted a significant decline in the last couple of weeks.  Patient used to be able to ambulate to the bathroom but over the last couple of weeks he has been bedbound and very weak and unable to get around.  He lives with his wife who is very frail and who has difficulty getting him up when he falls. ?Family initially trying to place them in an assisted living but with his worsening confusion, increased weakness and falls he most likely needs a higher level of care. ?He has been sleeping longer than normal according to his family and so they called his primary care provider who advised them to bring him to the ER for further evaluation. ? ?On arrival he was afebrile, labs pertinent for potassium of 2.9 which normalized with repletion this morning.  Troponin 68 >>63.  Elevated creatinine at 1.96 which started improving, thought to be due to poor p.o. intake.  Mild leukocytosis at 14.7, UA was not consistent with UTI, chest x-ray with stable chronic bilateral pleural effusion, no other acute abnormality.  CT head with no acute intracranial abnormality. ? ?Patient was moaning and seems miserable during morning rounds.  Does not want to eat.  Unable to explain where is his pain.  No nausea or vomiting.  Had a large bowel movement earlier in the day, but her son who was present at bedside this is his routine as he goes 1-2 times a week with a large bowel movement.  It was thought to have abdominal pain as morning increased by pressing on belly, KUB obtained and it was without  any acute findings.  GI cocktail and low-dose morphine administer with no significant change. ? ?Little later around 11:40 AM, when nursing staff was trying to clean him up, he suddenly became unresponsive and turned blue.  Telemetry called the nursing staff with progressive bradycardia and then asystole.  Patient was DNR.  He was pronounced dead at 11:50 AM. ?Son was present at bedside. ?

## 2022-02-14 NOTE — Progress Notes (Signed)
Passed additional meds at 10:52 patient was alert to self with son in the room.   ?

## 2022-02-14 NOTE — Death Summary Note (Signed)
? ?DEATH SUMMARY  ? ?Patient Details  ?Name: Jonathon Welch ?MRN: [id number] ?DOB: [birth year] ?BPZ:WCHENI, Christean Grief, MD ?Admission/Discharge Information  ? ?Admit Date:  01/29/2022  ?Date of Death: Date of Death: 2022/01/30  ?Time of Death: Time of Death: 15  ?Length of Stay: 0  ? ?Principle Cause of death: Sudden cardiopulmonary arrest. ? ?Hospital Diagnoses: ?Principal Problem: ?  Weakness ?Active Problems: ?  Frequent falls ?  Chronic atrial fibrillation (HCC) ?  Parkinson's disease ([place]) ?  Hypertension ?  Polycystic kidney disease ?  Hypokalemia ? ? ?Hospital Course: ?Taken from H&P. ? ? Jonathon Welch is a 86 y.o. male with medical history significant for atrial fibrillation on chronic anticoagulation therapy, Parkinson's disease, polycystic kidney disease, chronic systolic heart failure with last known LVEF of 30 to 35%, depression, dementia who was brought into the ER by his family for evaluation of progressively worsening weakness, frequent falls and increased confusion. ?Patient's wife states that she has noted a significant decline in the last couple of weeks.  Patient used to be able to ambulate to the bathroom but over the last couple of weeks he has been bedbound and very weak and unable to get around.  He lives with his wife who is very frail and who has difficulty getting him up when he falls. ?Family initially trying to place them in an assisted living but with his worsening confusion, increased weakness and falls he most likely needs a higher level of care. ?He has been sleeping longer than normal according to his family and so they called his primary care provider who advised them to bring him to the ER for further evaluation. ? ?On arrival he was afebrile, labs pertinent for potassium of 2.9 which normalized with repletion this morning.  Troponin 68 >>63.  Elevated creatinine at 1.96 which started improving, thought to be due to poor p.o. intake.  Mild leukocytosis at 14.7, UA was not consistent  with UTI, chest x-ray with stable chronic bilateral pleural effusion, no other acute abnormality.  CT head with no acute intracranial abnormality. ? ?Patient was moaning and seems miserable during morning rounds.  Does not want to eat.  Unable to explain where is his pain.  No nausea or vomiting.  Had a large bowel movement earlier in the day, but her son who was present at bedside this is his routine as he goes 1-2 times a week with a large bowel movement.  It was thought to have abdominal pain as morning increased by pressing on belly, KUB obtained and it was without any acute findings.  GI cocktail and low-dose morphine administer with no significant change. ? ?Little later around 11:40 AM, when nursing staff was trying to clean him up, he suddenly became unresponsive and turned blue.  Telemetry called the nursing staff with progressive bradycardia and then asystole.  Patient was DNR.  He was pronounced dead at 11:50 AM. ?Son was present at bedside. ? ?Assessment and Plan: ?* Weakness ?Patient with a history of Parkinson's disease and dementia who presents to the ER for evaluation of worsening weakness and frequent falls at home. ?Place patient on fall precautions ?PT evaluation ? ?Frequent falls ?Patient with a history of Parkinson's disease, dementia who presents for evaluation of frequent falls related to weakness ?Place patient on fall precautions ?PT evaluation ? ?Hypokalemia ?Secondary to diuretic use ?Supplement potassium ?Check magnesium levels ? ?Polycystic kidney disease ?Patient has a history of polycystic kidney disease with stage IV chronic kidney disease ?Renal  function appears to be at baseline ?Monitor closely during this hospitalization ? ?Hypertension ?Blood pressure is stable ?Continue amlodipine ? ?Parkinson's disease ([place]) ?Continue Sinemet and selegiline ? ?Chronic atrial fibrillation (HCC) ?Rate controlled ?Continue Eliquis as secondary prophylaxis for an acute stroke ? ?Procedures:  None ? ?Consultations: None ? ?The results of significant diagnostics from this hospitalization (including imaging, microbiology, ancillary and laboratory) are listed below for reference.  ? ?Significant Diagnostic Studies: ?DG Abd 1 View ? ?Result Date: January 22, 2022 ?CLINICAL DATA:  Abdominal pain.  Unresponsive EXAM: ABDOMEN - 1 VIEW COMPARISON:  CT 11/25/2021 FINDINGS: Single supine view of the abdomen and pelvis demonstrates a nonobstructive bowel-gas pattern. No gross free intraperitoneal air. Vascular calcifications. Low pelvis is excluded. Median sternotomy wires. Mitral annular calcifications. Cardiomegaly. Mild convex right lumbar spine curvature. IMPRESSION: No acute findings. Electronically Signed   By: Abigail Miyamoto M.D.   On: 22-Jan-2022 11:10  ? ?CT Head Wo Contrast ? ?Result Date: 01/25/2022 ?CLINICAL DATA:  Altered mental status. EXAM: CT HEAD WITHOUT CONTRAST TECHNIQUE: Contiguous axial images were obtained from the base of the skull through the vertex without intravenous contrast. RADIATION DOSE REDUCTION: This exam was performed according to the departmental dose-optimization program which includes automated exposure control, adjustment of the mA and/or kV according to patient size and/or use of iterative reconstruction technique. COMPARISON:  November 18, 2020. FINDINGS: Brain: Mild chronic ischemic white matter disease is noted. No mass effect or midline shift is noted. Ventricular size is within normal limits. There is no evidence of mass lesion, hemorrhage or acute infarction. Vascular: No hyperdense vessel or unexpected calcification. Skull: Normal. Negative for fracture or focal lesion. Sinuses/Orbits: Bilateral maxillary mucous retention cysts are noted. Other: None. IMPRESSION: No acute intracranial abnormality seen. Electronically Signed   By: Marijo Conception M.D.   On: 01/15/2022 11:49  ? ?DG Chest Port 1 View ? ?Result Date: 02/13/2022 ?CLINICAL DATA:  Increasing confusion and weakness. EXAM:  PORTABLE CHEST 1 VIEW COMPARISON:  Chest x-ray dated November 25, 2021. FINDINGS: Unchanged cardiomegaly. Relatively unchanged small bilateral pleural effusions and left basilar atelectasis. Slightly worsened right basilar atelectasis. No pneumothorax. No acute osseous abnormality. IMPRESSION: 1. Relatively unchanged small bilateral pleural effusions and bibasilar atelectasis. Electronically Signed   By: Titus Dubin M.D.   On: 01/25/2022 11:11   ? ?Microbiology: ?Recent Results (from the past 240 hour(s))  ?Resp Panel by RT-PCR (Flu A&B, Covid) Nasopharyngeal Swab     Status: None  ? Collection Time: 01/20/2022 12:38 PM  ? Specimen: Nasopharyngeal Swab; Nasopharyngeal(NP) swabs in vial transport medium  ?Result Value Ref Range Status  ? SARS Coronavirus 2 by RT PCR NEGATIVE NEGATIVE Final  ?  Comment: (NOTE) ?SARS-CoV-2 target nucleic acids are NOT DETECTED. ? ?The SARS-CoV-2 RNA is generally detectable in upper respiratory ?specimens during the acute phase of infection. The lowest ?concentration of SARS-CoV-2 viral copies this assay can detect is ?138 copies/mL. A negative result does not preclude SARS-Cov-2 ?infection and should not be used as the sole basis for treatment or ?other patient management decisions. A negative result may occur with  ?improper specimen collection/handling, submission of specimen other ?than nasopharyngeal swab, presence of viral mutation(s) within the ?areas targeted by this assay, and inadequate number of viral ?copies(<138 copies/mL). A negative result must be combined with ?clinical observations, patient history, and epidemiological ?information. The expected result is Negative. ? ?Fact Sheet for Patients:  ?EntrepreneurPulse.com.au ? ?Fact Sheet for Healthcare Providers:  ?IncredibleEmployment.be ? ?This test is no t yet  approved or cleared by the [place] and  ?has been authorized for detection and/or diagnosis of SARS-CoV-2 by ?FDA  under an Emergency Use Authorization (EUA). This EUA will remain  ?in effect (meaning this test can be used) for the duration of the ?COVID-19 declaration under Section 564(b)(1) of the Act, 21 ?U.S.C.section 360bbb-

## 2022-02-14 NOTE — Evaluation (Signed)
Physical Therapy Evaluation ?Patient Details ?Name: Jonathon Welch ?MRN: [id number] ?DOB: September 08, 1936 ?Today's Date: Jan 28, 2022 ? ?History of Present Illness ? 89 yy/o male recently here with dehydration and possible UTI now here with increasing falls, confusion and weakness.  PMH of A-fib, HTN, Parkinson's, dementia, polycystic kidney disease, CKD stage IIIa, HDL, and aortic valve stenosis status post AVR as well as systolic heart failure.  ?Clinical Impression ? Pt intermittently able to open eyes and show some interaction, but generally speaking showed poor overall awareness and ability to fully engage with PT.  He did give some effort with getting to EOB and in getting to standing.  Unsure if it was just a BM situation, but despite having BSC set up close and much encouragement to try shuffling/turning to get to it he could not stand upright in the walker (w/o direct assist to keep weight forward) or meaningfully transition/step/turn despite much verbal and tactile cuing while standing EOB.  Pt apparently has had at least 6 falls in the last 2 weeks and showed unsteadiness/lack of safety on eval.  Pt unsafe to return home at this time will require higher level of care.    ?   ? ?Recommendations for follow up therapy are one component of a multi-disciplinary discharge planning process, led by the attending physician.  Recommendations may be updated based on patient status, additional functional criteria and insurance authorization. ? ?Follow Up Recommendations Skilled nursing-short term rehab (<3 hours/day) ? ?  ?Assistance Recommended at Discharge Frequent or constant Supervision/Assistance  ?Patient can return home with the following ?   ? ?  ?Equipment Recommendations  (TBD at rehab)  ?Recommendations for Other Services ?    ?  ?Functional Status Assessment Patient has had a recent decline in their functional status and demonstrates the ability to make significant improvements in function in a reasonable and  predictable amount of time.  ? ?  ?Precautions / Restrictions Precautions ?Precautions: Fall ?Restrictions ?Weight Bearing Restrictions: No  ? ?  ? ?Mobility ? Bed Mobility ?Overal bed mobility: Needs Assistance ?Bed Mobility: Supine to Sit, Sit to Supine ?  ?  ?Supine to sit: Mod assist ?Sit to supine: Mod assist ?  ?General bed mobility comments: Pt able to assist some with transition to/from supine/sitting but needed constant verbal and tactile cuing and regular actual physical assist ?  ? ?Transfers ?Overall transfer level: Needs assistance ?Equipment used: Rolling walker (2 wheels) ?Transfers: Sit to/from Stand ?Sit to Stand: Mod assist ?  ?  ?  ?  ?  ?General transfer comment: Pt able to initiate attempt at standing after repeated cues and encouragement, unable to keep weight forward in walker needing constant assist to maitnain upright.  Unable to step to get to the [facility] and he soilded himself standing at EOB, nursing in to assist with clean up after return to bed ?  ? ?Ambulation/Gait ?  ?  ?  ?  ?  ?  ?  ?  ? ?Stairs ?  ?  ?  ?  ?  ? ?Wheelchair Mobility ?  ? ?Modified Rankin (Stroke Patients Only) ?  ? ?  ? ?Balance Overall balance assessment: Needs assistance ?Sitting-balance support: Bilateral upper extremity supported ?Sitting balance-Leahy Scale: Fair ?Sitting balance - Comments: leaning back and to the R, occasional min assist to maintain sitting ?  ?  ?Standing balance-Leahy Scale: Poor ?Standing balance comment: Pt able to show some effort in standing but needed at least some consant assist, seemed unable  to truely use AD appropriately/shift weight onto UEs ?  ?  ?  ?  ?  ?  ?  ?  ?  ?  ?  ?   ? ? ? ?Pertinent Vitals/Pain Pain Assessment ?Pain Assessment: Faces ?Faces Pain Scale: Hurts little more ?Pain Location: possibly bowel discomfort, only able to give general disapproval moaning from time to time  ? ? ?Home Living Family/patient expects to be discharged to:: Skilled nursing facility ?Living  Arrangements: Spouse/significant other ?  ?  ?  ?  ?  ?  ?  ?  ?Additional Comments: increasing falls and AMS recently.  Wife reports he has been very weak and immobile  ?  ?Prior Function Prior Level of Function : Needs assist ?  ?  ?  ?  ?  ?  ?Mobility Comments: has rollator and RW, has been much more reliant on it in recent months ?  ?  ? ? ?Hand Dominance  ?   ? ?  ?Extremity/Trunk Assessment  ? Upper Extremity Assessment ?Upper Extremity Assessment: Generalized weakness;Difficult to assess due to impaired cognition ?  ? ?Lower Extremity Assessment ?Lower Extremity Assessment: Generalized weakness;Difficult to assess due to impaired cognition ?  ? ?   ?Communication  ? Communication: HOH (pt struggled to verbalize in a way that PT or wife could comprehend)  ?Cognition Arousal/Alertness: Awake/alert ?Behavior During Therapy: Restless ?Overall Cognitive Status: Difficult to assess ?  ?  ?  ?  ?  ?  ?  ?  ?  ?  ?  ?  ?  ?  ?  ?  ?General Comments: Pt with baseline dementia/confusion, but per wife he is far from his baseline with only groaning and minimal 2-way interaction ?  ?  ? ?  ?General Comments General comments (skin integrity, edema, etc.): Pt weak and confused ? ?  ?Exercises    ? ?Assessment/Plan  ?  ?PT Assessment Patient needs continued PT services  ?PT Problem List Decreased strength;Decreased range of motion;Decreased activity tolerance;Decreased balance;Decreased mobility;Decreased cognition;Decreased knowledge of use of DME;Decreased safety awareness ? ?   ?  ?PT Treatment Interventions DME instruction;Gait training;Functional mobility training;Therapeutic activities;Therapeutic exercise;Balance training;Cognitive remediation;Neuromuscular re-education;Patient/family education   ? ?PT Goals (Current goals can be found in the Care Plan section)  ?Acute Rehab PT Goals ?Patient Stated Goal: She'd like to see him get stronger and go home, but acknowledges that recently she has struggled to be able to  care for him ?PT Goal Formulation: With family ?Time For Goal Achievement: 02/01/22 ?Potential to Achieve Goals: Fair ? ?  ?Frequency Min 2X/week ?  ? ? ?Co-evaluation   ?  ?  ?  ?  ? ? ?  ?AM-PAC PT "6 Clicks" Mobility  ?Outcome Measure Help needed turning from your back to your side while in a flat bed without using bedrails?: A Little ?Help needed moving from lying on your back to sitting on the side of a flat bed without using bedrails?: A Little ?Help needed moving to and from a bed to a chair (including a wheelchair)?: A Lot ?Help needed standing up from a chair using your arms (e.g., wheelchair or bedside chair)?: A Lot ?Help needed to walk in hospital room?: Total ?Help needed climbing 3-5 steps with a railing? : Total ?6 Click Score: 12 ? ?  ?End of Session   ?Activity Tolerance: Patient tolerated treatment well ?Patient left: in bed;with call bell/phone within reach;with nursing/sitter in room;with family/visitor present ?Nurse Communication: Mobility  status ?PT Visit Diagnosis: Muscle weakness (generalized) (M62.81);Difficulty in walking, not elsewhere classified (R26.2);Unsteadiness on feet (R26.81) ?  ? ?Time: 3794-3276 ?PT Time Calculation (min) (ACUTE ONLY): 20 min ? ? ?Charges:   PT Evaluation ?$PT Eval Low Complexity: 1 Low ?PT Treatments ?$Therapeutic Activity: 8-22 mins ?  ?   ? ? ?Kreg Shropshire, DPT ?Feb 11, 2022, 10:23 AM ? ?

## 2022-02-14 DEATH — deceased

## 2022-05-07 IMAGING — CT CT ABD-PELV W/O CM
2 of 4 series · 15 of 46 positions shown, 17 images · non-contrast
Comparison: CT abdomen and pelvis dated March 16, 2020; MRI abdomen
dated April 09, 2021

CLINICAL DATA: Abdominal pain



[Series 2: routine abd/pel wo · axial · 0.82mm/px · z∈[-558,-48]mm · 12 of 112 slices shown, 14 images]
[im 5/112  soft-tissue]
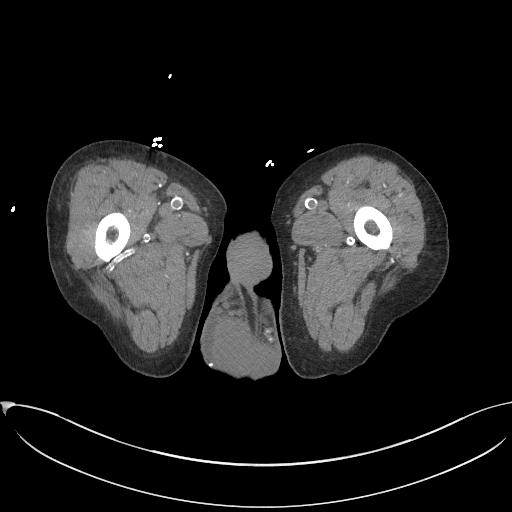
[im 5/112  bone]
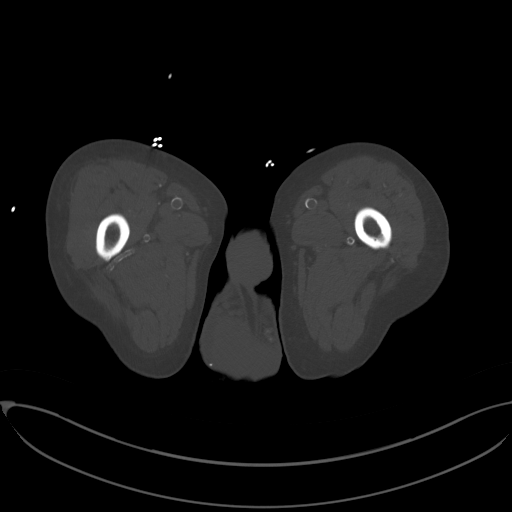
[im 14/112  soft-tissue]
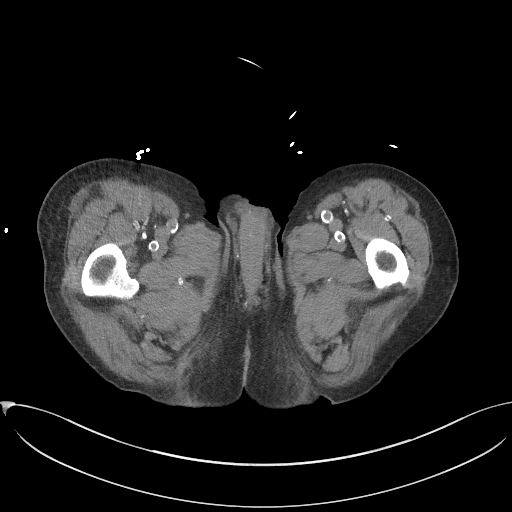
[im 24/112  soft-tissue]
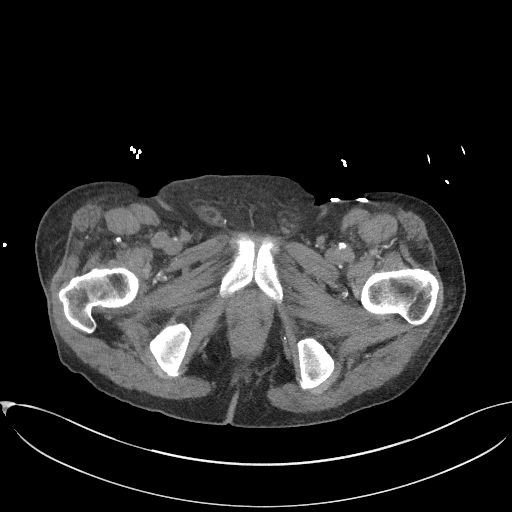
[im 33/112  soft-tissue]
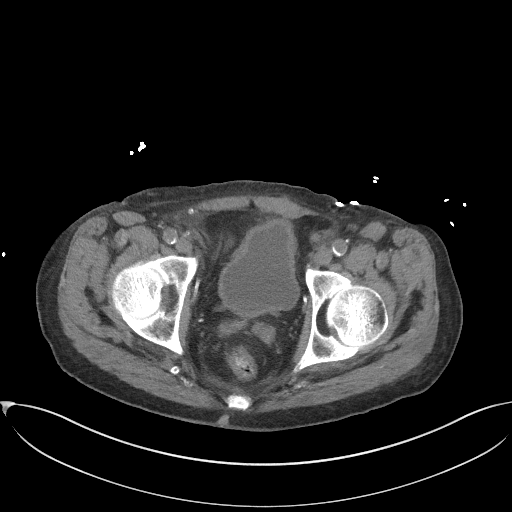
[im 42/112  soft-tissue]
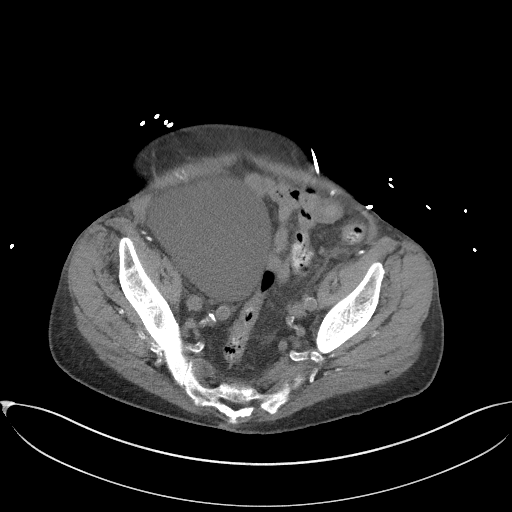
[im 51/112  soft-tissue]
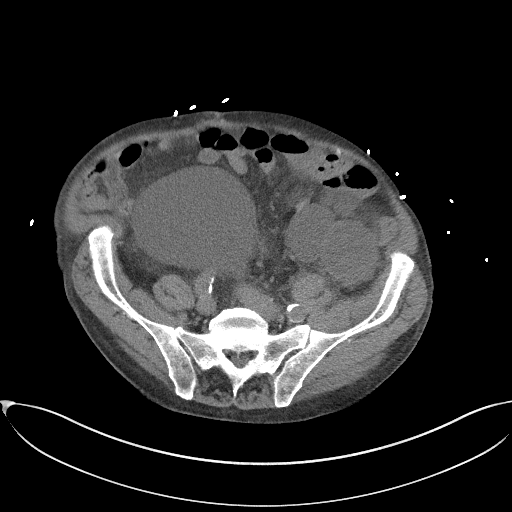
[im 61/112  soft-tissue]
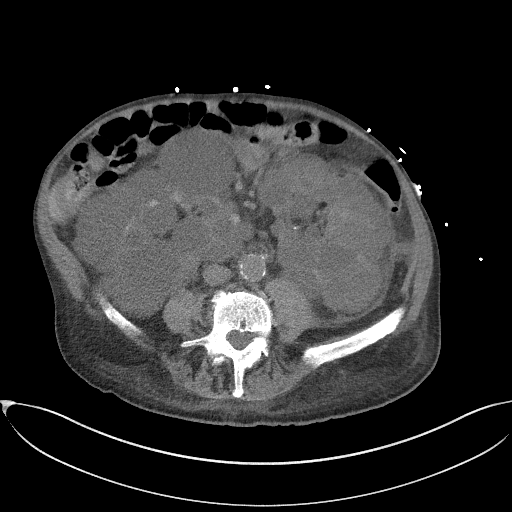
[im 70/112  soft-tissue]
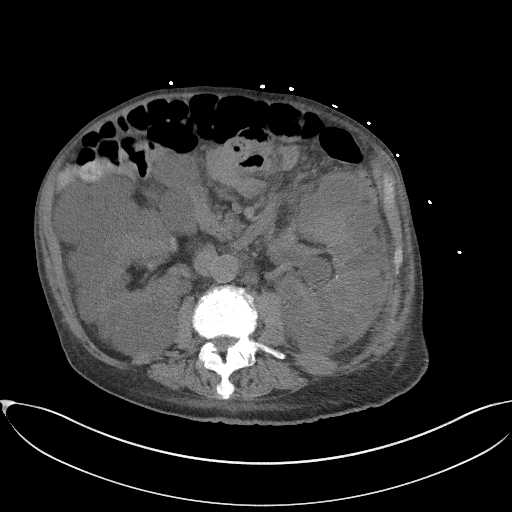
[im 79/112  soft-tissue]
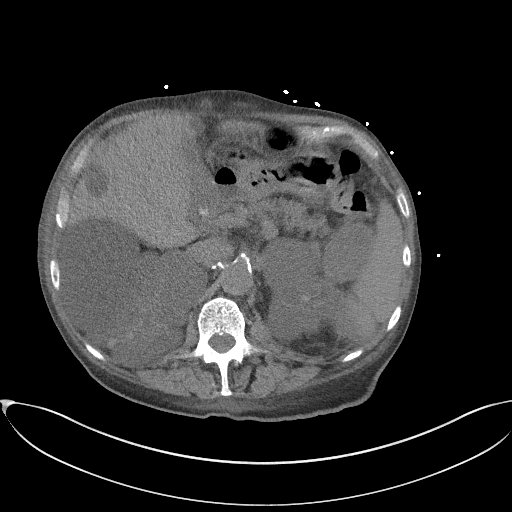
[im 79/112  bone]
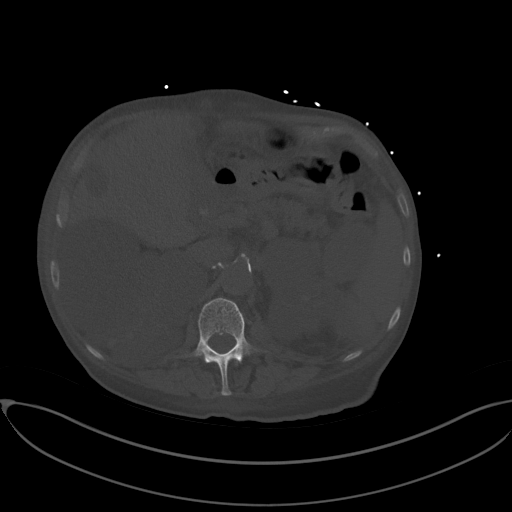
[im 88/112  soft-tissue]
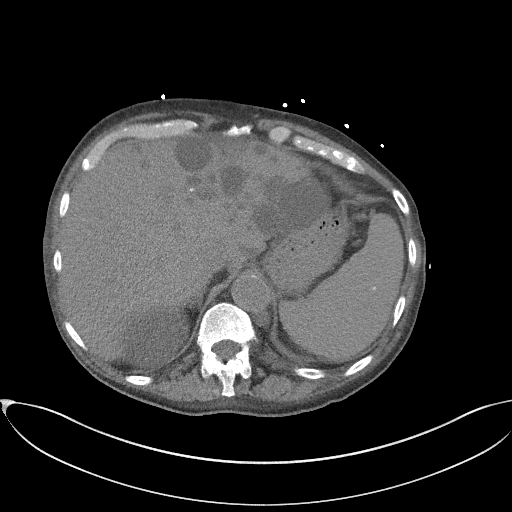
[im 98/112  soft-tissue]
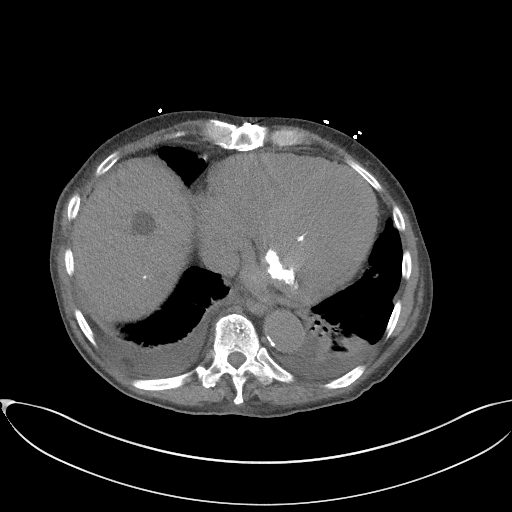
[im 107/112  soft-tissue]
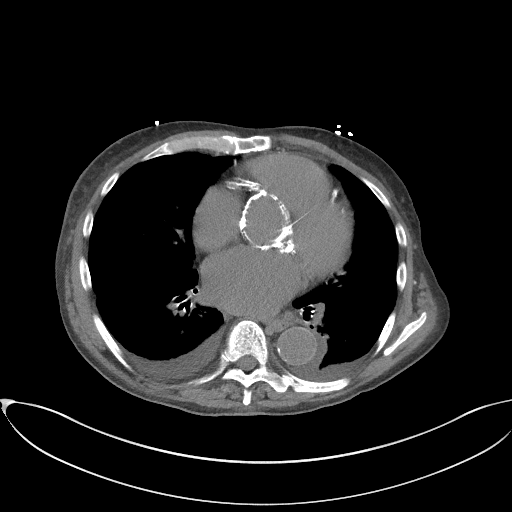

[Series 5: coronal st · coronal · 0.77mm/px · 3 of 97 slices shown]
[im 33/97  soft-tissue]
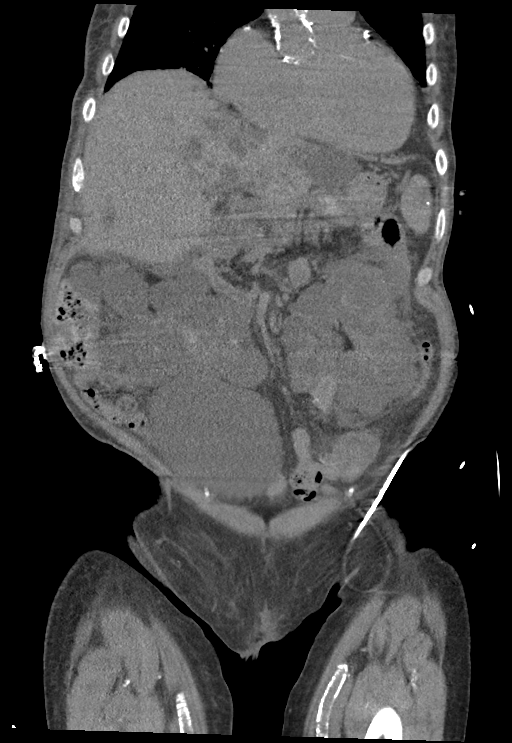
[im 43/97  soft-tissue]
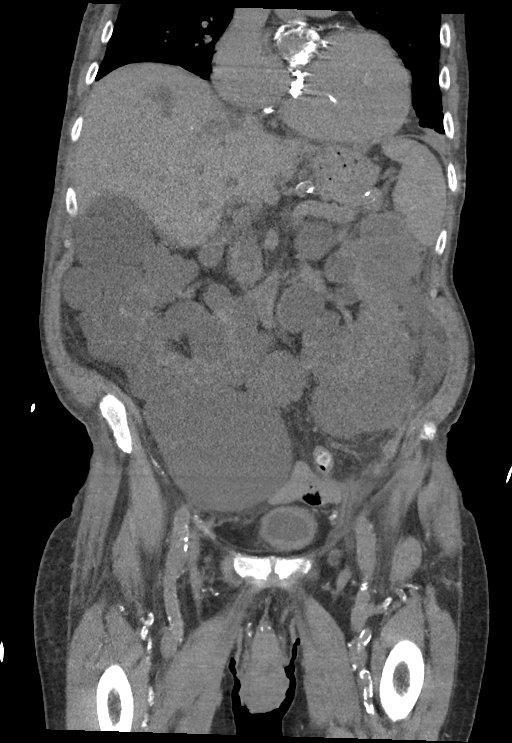
[im 54/97  soft-tissue]
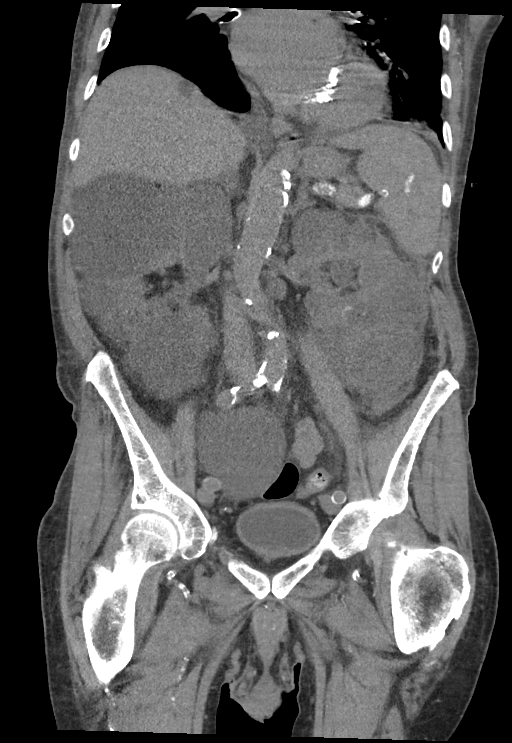

[15 of 46 positions shown; findings below may reference images not displayed]

FINDINGS: Lower chest: Cardiomegaly with coronary artery and mitral annular
calcifications. Small bilateral pleural effusions and atelectasis.

Hepatobiliary: Numerous low-attenuation lesions are seen throughout
the liver, but most pronounced in the left lobe of the liver,
unchanged compared to prior and likely simple cysts. Cholelithiasis
with no gallbladder wall thickening. Cystic lesion of the head of
the pancreas measuring 2.0 cm

Pancreas: Unchanged pancreatic head cyst measuring 2.1 cm. No
peripancreatic inflammation.

Spleen: Normal in size without focal abnormality.

Adrenals/Urinary Tract: Bilateral adrenal glands are unremarkable.
New left perinephric fat stranding. A complex cyst of the lower pole
of the left kidney is decreased in size when compared with prior
exam, measuring up to 2.0 cm on series 5, image 32, previously
measured up to 5.1 cm. Additional numerous cystic lesions are seen
throughout the bilateral kidneys, most are compatible with simple
cysts, others demonstrate high density, likely due to proteinaceous
material. Bladder is unremarkable.

Stomach/Bowel: Small hiatal hernia. Stomach is otherwise
unremarkable. No bowel wall thickening, inflammatory change or
evidence of obstruction. Appendix is not definitely visualized,
although there are no secondary findings of acute appendicitis.

Vascular/Lymphatic: Aortic atherosclerosis. No enlarged abdominal or
pelvic lymph nodes.

Reproductive: Prostate is unremarkable.

Other: Small umbilical hernia containing fat and fluid. Trace pelvic
free fluid.

Musculoskeletal: No acute or significant osseous findings.
IMPRESSION: 1. New left perinephric fat stranding. A complex cyst of the lower
pole the left kidney is decreased in size when compared with prior
exam. Findings may be due to ruptured cyst, although infection is an
additional consideration. Recommend correlation with urinalysis.
2. Findings compatible with polycystic kidney disease including
numerous hepatic and renal cysts.
3. Stable cystic lesion of the head of the pancreas, previously
evaluated on prior MRI.
4.  Aortic Atherosclerosis ([id number]).

## 2022-05-07 IMAGING — DX DG CHEST 1V PORT
1 series · 1 of 1 positions shown · non-contrast
Comparison: AP chest 03/01/2021

CLINICAL DATA: Congestive heart failure. Hypertensive earlier
today. History of CABG and aortic valve replacement.

EXAM:
PORTABLE CHEST 1 VIEW

[chest ap]
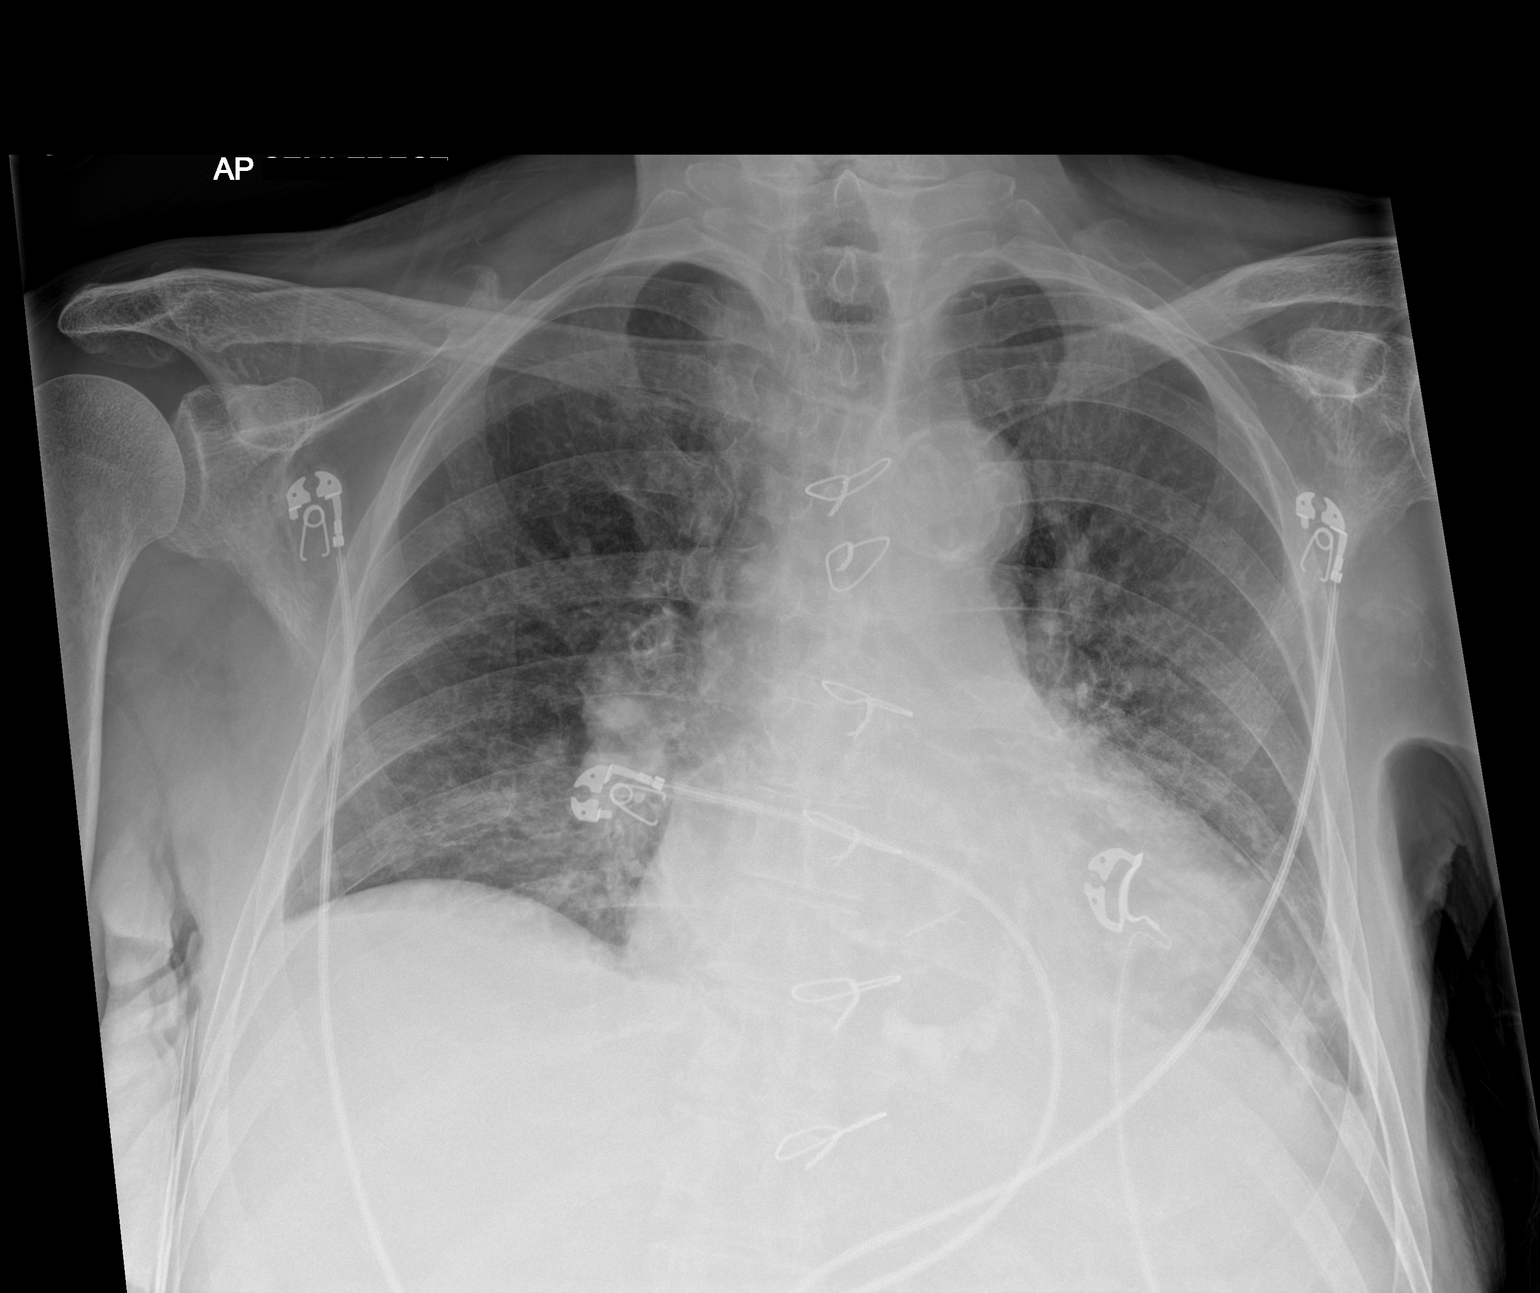

[1 of 1 positions shown; findings below may reference images not displayed]

FINDINGS: Status post median sternotomy. Mildly to moderately enlarged cardiac
silhouette, unchanged. Likely mitral annular calcification. Mild
calcification within aortic arch. Mildly decreased lung volumes with
bronchovascular crowding. Cephalization of the pulmonary vasculature
without overt pulmonary edema. No pleural effusion or pneumothorax.
No acute skeletal abnormality.
IMPRESSION: Cephalization of the pulmonary vasculature without overt pulmonary
edema.

## 2022-06-07 ENCOUNTER — Ambulatory Visit: Payer: Medicare Other | Admitting: Urology

## 2022-06-29 IMAGING — CT CT HEAD W/O CM
5 of 6 series · 17 of 47 positions shown, 18 images · non-contrast
Comparison: November 18, 2020.

CLINICAL DATA: Altered mental status.



[Series 2: head bone · axial · 0.41mm/px · z∈[-128,-14]mm · 7 of 83 slices shown]
[im 9/83  bone]
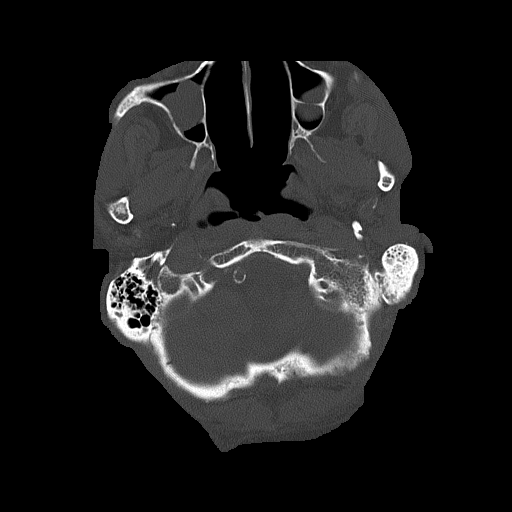
[im 17/83  bone]
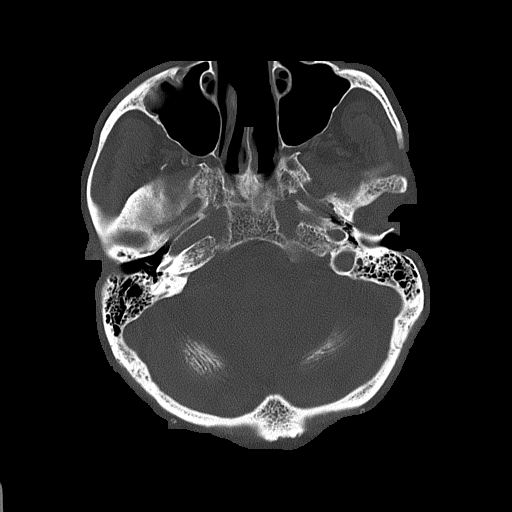
[im 25/83  bone]
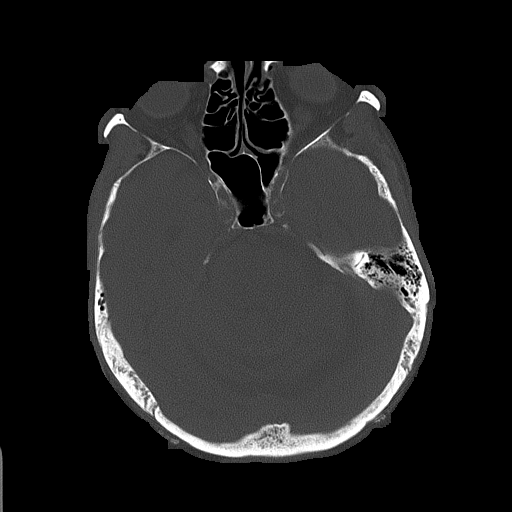
[im 33/83  bone]
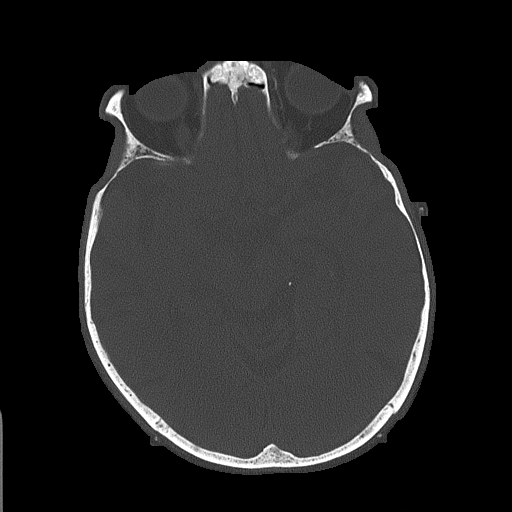
[im 50/83  bone]
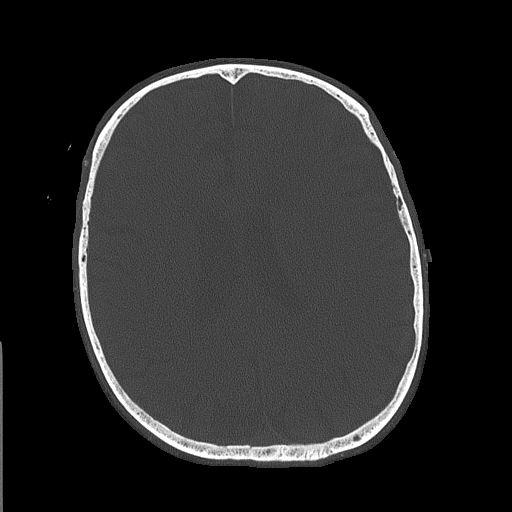
[im 58/83  bone]
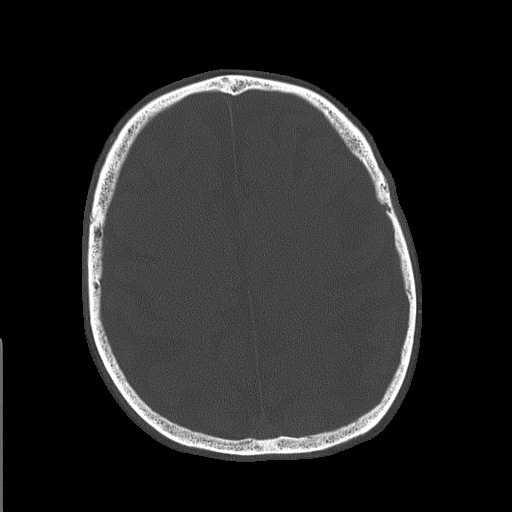
[im 66/83  bone]
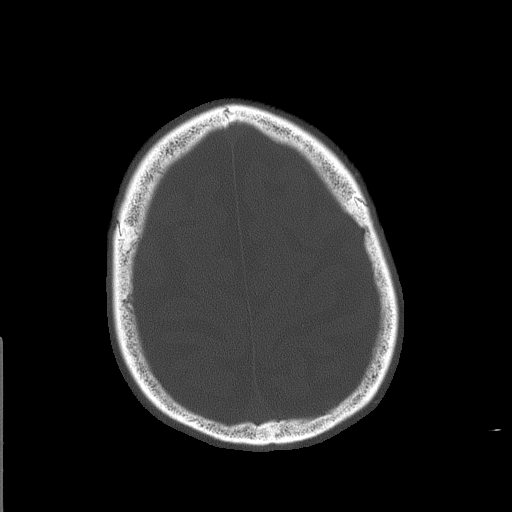

[Series 3: head wo · axial · 0.41mm/px · z∈[-89,-34]mm · 2 of 34 slices shown, 3 images (1 of 2)]
[im 12/34  brain]
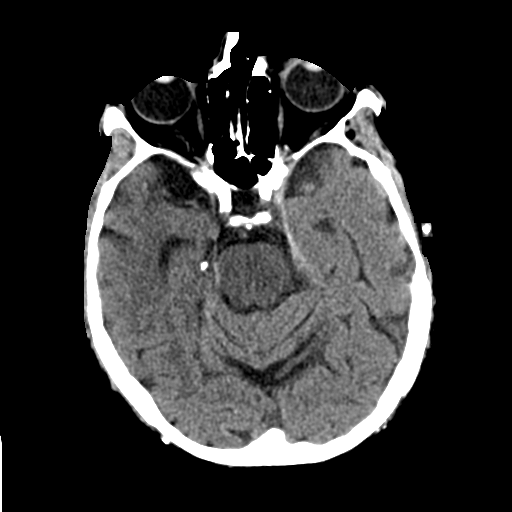
[im 12/34  bone]
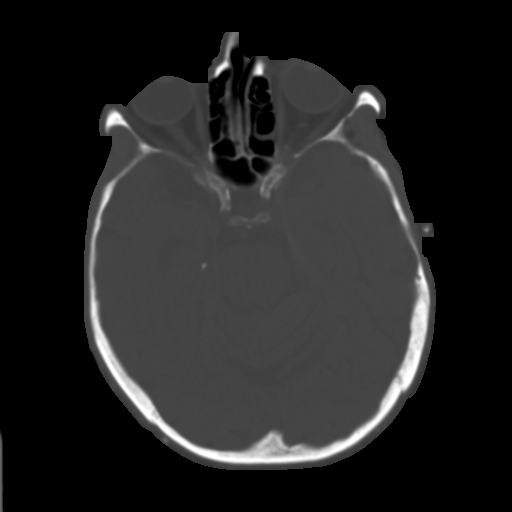
[im 23/34  brain]
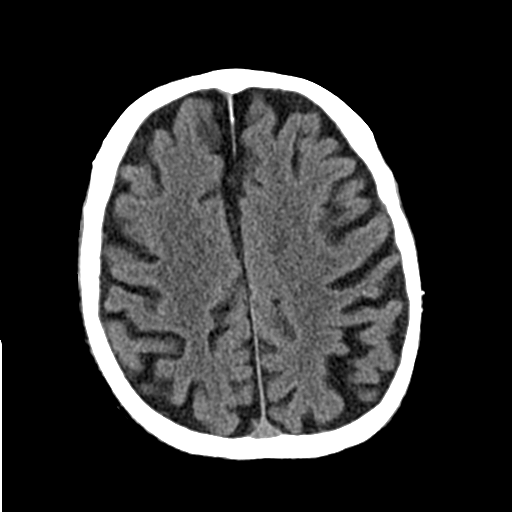

[Series 4: head wo · axial · 0.36mm/px · z∈[-83,-30]mm · 2 of 34 slices shown (2 of 2)]
[im 12/34  brain]
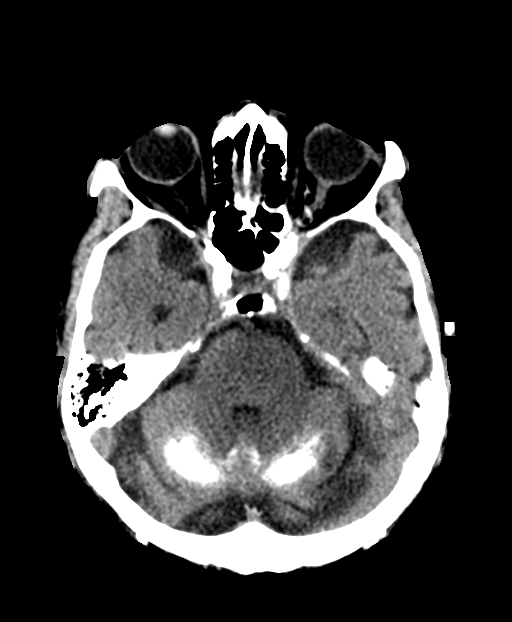
[im 23/34  brain]
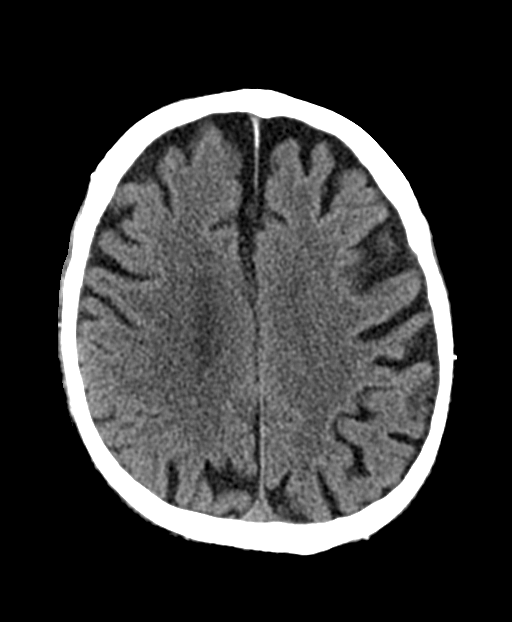

[Series 6: coronal soft tissue · coronal · 0.36mm/px · 3 of 72 slices shown]
[im 24/72  brain]
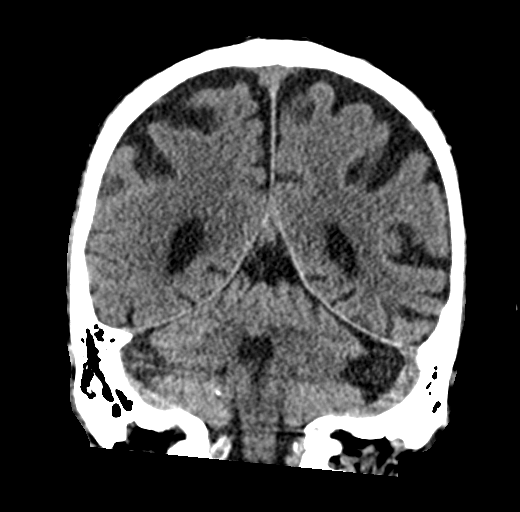
[im 32/72  brain]
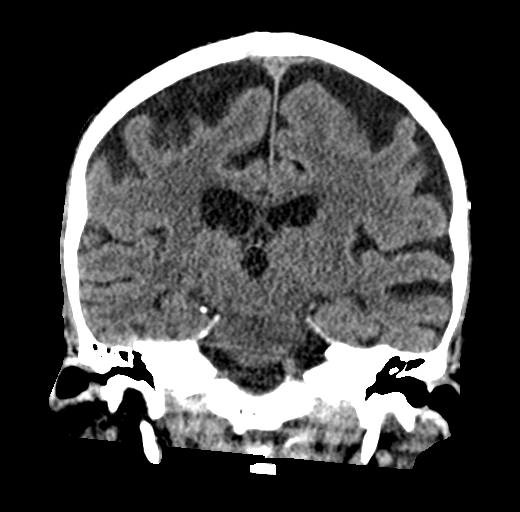
[im 40/72  brain]
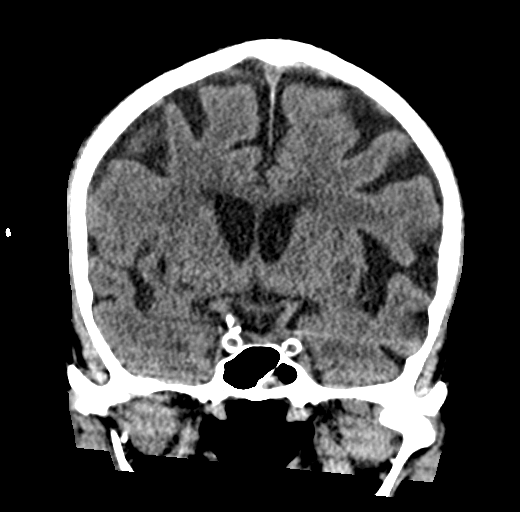

[Series 7: sagittal soft tissue · sagittal · 0.36mm/px · 3 of 54 slices shown]
[im 18/54  brain]
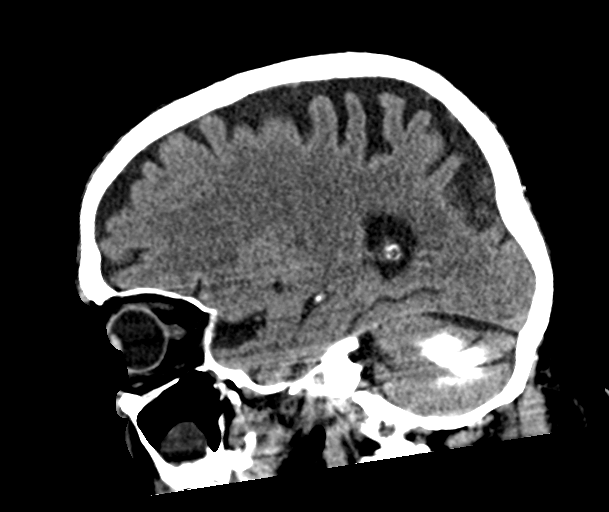
[im 27/54  brain]
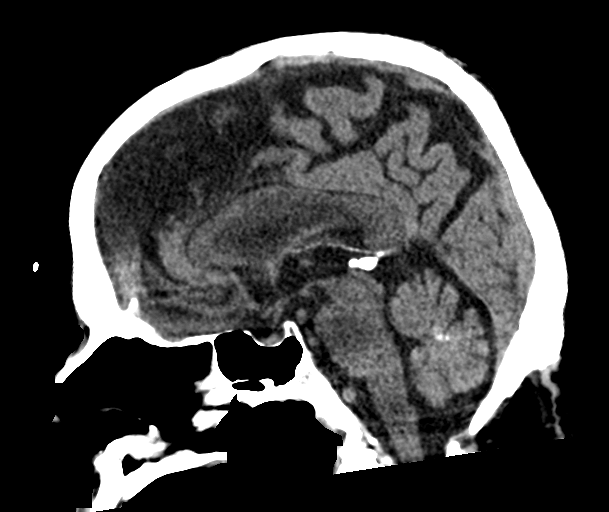
[im 36/54  brain]
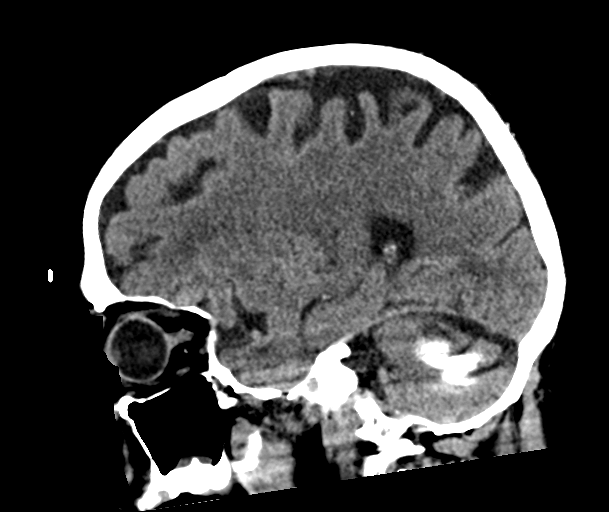

[17 of 47 positions shown; findings below may reference images not displayed]

FINDINGS: Brain: Mild chronic ischemic white matter disease is noted. No mass
effect or midline shift is noted. Ventricular size is within normal
limits. There is no evidence of mass lesion, hemorrhage or acute
infarction.

Vascular: No hyperdense vessel or unexpected calcification.

Skull: Normal. Negative for fracture or focal lesion.

Sinuses/Orbits: Bilateral maxillary mucous retention cysts are
noted.

Other: None.
IMPRESSION: No acute intracranial abnormality seen.
